# Patient Record
Sex: Male | Born: 1985
Health system: Southern US, Community
[De-identification: ages and names within clinical notes are randomized; demographics above are authoritative.]

## PROBLEM LIST (undated history)

## (undated) ENCOUNTER — Emergency Department: Admission: EM | Payer: MEDICAID | Source: Home / Self Care

## (undated) DIAGNOSIS — N179 Acute kidney failure, unspecified: Secondary | ICD-10-CM

## (undated) DIAGNOSIS — E1165 Type 2 diabetes mellitus with hyperglycemia: Secondary | ICD-10-CM

## (undated) HISTORY — PX: TONSILLECTOMY: SUR1361

## (undated) HISTORY — PX: TYMPANOSTOMY TUBE PLACEMENT: SHX32

## (undated) HISTORY — DX: Type 2 diabetes mellitus with hyperglycemia: E11.65

## (undated) HISTORY — DX: Acute kidney failure, unspecified: N17.9

---

## 2004-05-22 ENCOUNTER — Emergency Department: Payer: Self-pay | Admitting: Emergency Medicine

## 2005-07-11 ENCOUNTER — Emergency Department: Payer: Self-pay | Admitting: Emergency Medicine

## 2007-07-25 ENCOUNTER — Emergency Department: Payer: Self-pay | Admitting: Emergency Medicine

## 2012-10-04 ENCOUNTER — Emergency Department: Payer: Self-pay | Admitting: Internal Medicine

## 2015-03-27 ENCOUNTER — Inpatient Hospital Stay: Payer: Self-pay

## 2015-03-27 ENCOUNTER — Inpatient Hospital Stay
Admission: EM | Admit: 2015-03-27 | Discharge: 2015-03-27 | DRG: 683 | Discharge status: Against Medical Advice | Payer: Self-pay | Attending: Internal Medicine | Admitting: Internal Medicine

## 2015-03-27 ENCOUNTER — Encounter: Payer: Self-pay | Admitting: Internal Medicine

## 2015-03-27 DIAGNOSIS — Z716 Tobacco abuse counseling: Secondary | ICD-10-CM | POA: Diagnosis present

## 2015-03-27 DIAGNOSIS — Z8249 Family history of ischemic heart disease and other diseases of the circulatory system: Secondary | ICD-10-CM

## 2015-03-27 DIAGNOSIS — F17210 Nicotine dependence, cigarettes, uncomplicated: Secondary | ICD-10-CM | POA: Diagnosis present

## 2015-03-27 DIAGNOSIS — R112 Nausea with vomiting, unspecified: Secondary | ICD-10-CM

## 2015-03-27 DIAGNOSIS — N179 Acute kidney failure, unspecified: Principal | ICD-10-CM | POA: Diagnosis present

## 2015-03-27 DIAGNOSIS — E871 Hypo-osmolality and hyponatremia: Secondary | ICD-10-CM | POA: Diagnosis present

## 2015-03-27 LAB — COMPREHENSIVE METABOLIC PANEL
AST: 37 U/L (ref 15–41)
Albumin: 6.8 g/dL — ABNORMAL HIGH (ref 3.5–5.0)
Alkaline Phosphatase: 118 U/L (ref 38–126)
Anion gap: 18 — ABNORMAL HIGH (ref 5–15)
BUN: 29 mg/dL — AB (ref 6–20)
CALCIUM: 11.1 mg/dL — AB (ref 8.9–10.3)
CO2: 21 mmol/L — AB (ref 22–32)
CREATININE: 3.4 mg/dL — AB (ref 0.61–1.24)
Chloride: 94 mmol/L — ABNORMAL LOW (ref 101–111)
GFR calc non Af Amer: 23 mL/min — ABNORMAL LOW (ref >60)
GFR, EST AFRICAN AMERICAN: 26 mL/min — AB (ref >60)
Glucose, Bld: 192 mg/dL — ABNORMAL HIGH (ref 65–99)
POTASSIUM: 4 mmol/L (ref 3.5–5.1)
Sodium: 133 mmol/L — ABNORMAL LOW (ref 135–145)
TOTAL PROTEIN: 11 g/dL — AB (ref 6.5–8.1)
Total Bilirubin: 0.6 mg/dL (ref 0.3–1.2)

## 2015-03-27 LAB — LIPASE, BLOOD: LIPASE: 18 U/L — AB (ref 22–51)

## 2015-03-27 LAB — CBC WITH DIFFERENTIAL/PLATELET
BASOS PCT: 0 %
Basophils Absolute: 0.1 10*3/uL (ref 0–0.1)
EOS ABS: 0 10*3/uL (ref 0–0.7)
Eosinophils Relative: 0 %
HEMATOCRIT: 54.2 % — AB (ref 40.0–52.0)
Hemoglobin: 18.1 g/dL — ABNORMAL HIGH (ref 13.0–18.0)
LYMPHS PCT: 10 %
Lymphs Abs: 1.8 10*3/uL (ref 1.0–3.6)
MCH: 27.7 pg (ref 26.0–34.0)
MCHC: 33.4 g/dL (ref 32.0–36.0)
MCV: 83 fL (ref 80.0–100.0)
MONO ABS: 1 10*3/uL (ref 0.2–1.0)
MONOS PCT: 5 %
Neutro Abs: 15.1 10*3/uL — ABNORMAL HIGH (ref 1.4–6.5)
Neutrophils Relative %: 85 %
Platelets: 329 10*3/uL (ref 150–440)
RBC: 6.53 MIL/uL — AB (ref 4.40–5.90)
RDW: 14.6 % — ABNORMAL HIGH (ref 11.5–14.5)
WBC: 17.9 10*3/uL — ABNORMAL HIGH (ref 3.8–10.6)

## 2015-03-27 LAB — BASIC METABOLIC PANEL
ANION GAP: 11 (ref 5–15)
BUN: 28 mg/dL — AB (ref 6–20)
CALCIUM: 9.5 mg/dL (ref 8.9–10.3)
CO2: 24 mmol/L (ref 22–32)
Chloride: 100 mmol/L — ABNORMAL LOW (ref 101–111)
Creatinine, Ser: 2.6 mg/dL — ABNORMAL HIGH (ref 0.61–1.24)
GFR calc Af Amer: 36 mL/min — ABNORMAL LOW (ref >60)
GFR calc non Af Amer: 31 mL/min — ABNORMAL LOW (ref >60)
Glucose, Bld: 156 mg/dL — ABNORMAL HIGH (ref 65–99)
Potassium: 4.5 mmol/L (ref 3.5–5.1)
Sodium: 135 mmol/L (ref 135–145)

## 2015-03-27 LAB — URINALYSIS COMPLETE WITH MICROSCOPIC (ARMC ONLY)
BILIRUBIN URINE: NEGATIVE
Bacteria, UA: NONE SEEN
GLUCOSE, UA: NEGATIVE mg/dL
Ketones, ur: NEGATIVE mg/dL
LEUKOCYTES UA: NEGATIVE
Nitrite: NEGATIVE
PROTEIN: 30 mg/dL — AB
SPECIFIC GRAVITY, URINE: 1.021 (ref 1.005–1.030)
pH: 5 (ref 5.0–8.0)

## 2015-03-27 LAB — SODIUM, URINE, RANDOM: SODIUM UR: 20 mmol/L

## 2015-03-27 LAB — CK: Total CK: 348 U/L (ref 49–397)

## 2015-03-27 LAB — CREATININE, URINE, RANDOM: Creatinine, Urine: 330 mg/dL

## 2015-03-27 MED ORDER — SODIUM CHLORIDE 0.9 % IV BOLUS (SEPSIS)
1000.0000 mL | Freq: Once | INTRAVENOUS | Status: AC
  Administered 2015-03-27: 1000 mL via INTRAVENOUS

## 2015-03-27 MED ORDER — OXYCODONE HCL 5 MG PO TABS: 5.0000 mg | ORAL_TABLET | ORAL | Status: DC | PRN

## 2015-03-27 MED ORDER — ONDANSETRON HCL 4 MG PO TABS: 4.0000 mg | ORAL_TABLET | Freq: Four times a day (QID) | ORAL | Status: DC | PRN

## 2015-03-27 MED ORDER — NICOTINE 21 MG/24HR TD PT24
21.0000 mg | MEDICATED_PATCH | Freq: Every day | TRANSDERMAL | Status: DC
  Administered 2015-03-27: 21 mg via TRANSDERMAL
  Filled 2015-03-27: qty 1

## 2015-03-27 MED ORDER — ALPRAZOLAM 0.5 MG PO TABS
0.5000 mg | ORAL_TABLET | Freq: Two times a day (BID) | ORAL | Status: DC | PRN
  Administered 2015-03-27: 0.5 mg via ORAL
  Filled 2015-03-27: qty 1

## 2015-03-27 MED ORDER — ONDANSETRON HCL 4 MG/2ML IJ SOLN
INTRAMUSCULAR | Status: AC
  Administered 2015-03-27: 4 mg via INTRAVENOUS
  Filled 2015-03-27: qty 2

## 2015-03-27 MED ORDER — LORAZEPAM 2 MG/ML IJ SOLN
INTRAMUSCULAR | Status: AC
  Administered 2015-03-27: 1 mg via INTRAVENOUS
  Filled 2015-03-27: qty 1

## 2015-03-27 MED ORDER — ACETAMINOPHEN 650 MG RE SUPP: 650.0000 mg | Freq: Four times a day (QID) | RECTAL | Status: DC | PRN

## 2015-03-27 MED ORDER — HEPARIN SODIUM (PORCINE) 5000 UNIT/ML IJ SOLN
5000.0000 [IU] | Freq: Three times a day (TID) | INTRAMUSCULAR | Status: DC
  Administered 2015-03-27 (×2): 5000 [IU] via SUBCUTANEOUS
  Filled 2015-03-27 (×2): qty 1

## 2015-03-27 MED ORDER — LORAZEPAM 2 MG/ML IJ SOLN
1.0000 mg | Freq: Once | INTRAMUSCULAR | Status: AC
  Administered 2015-03-27: 1 mg via INTRAVENOUS

## 2015-03-27 MED ORDER — ACETAMINOPHEN 325 MG PO TABS
650.0000 mg | ORAL_TABLET | Freq: Four times a day (QID) | ORAL | Status: DC | PRN
  Administered 2015-03-27: 650 mg via ORAL
  Filled 2015-03-27: qty 2

## 2015-03-27 MED ORDER — ONDANSETRON HCL 4 MG/2ML IJ SOLN
4.0000 mg | Freq: Once | INTRAMUSCULAR | Status: AC
  Administered 2015-03-27: 4 mg via INTRAVENOUS

## 2015-03-27 MED ORDER — MORPHINE SULFATE 2 MG/ML IJ SOLN: 2.0000 mg | INTRAMUSCULAR | Status: DC | PRN

## 2015-03-27 MED ORDER — ONDANSETRON HCL 4 MG/2ML IJ SOLN: 4.0000 mg | Freq: Four times a day (QID) | INTRAMUSCULAR | Status: DC | PRN

## 2015-03-27 MED ORDER — SODIUM CHLORIDE 0.9 % IV SOLN
INTRAVENOUS | Status: DC
  Administered 2015-03-27 (×3): via INTRAVENOUS

## 2015-03-27 NOTE — H&P (Signed)
[facility] - [place] at [facility]   PATIENT NAME: Alexis [name]    MR#:  [id number]  DATE OF BIRTH:  12/17/1985   DATE OF ADMISSION:  03/27/2015  PRIMARY CARE PHYSICIAN: No primary care provider on file.   REQUESTING/REFERRING PHYSICIAN: Inocencio Homes  CHIEF COMPLAINT:   Chief Complaint  Patient presents with  . Dizziness  . Emesis  . Muscle Pain    HISTORY OF PRESENT ILLNESS:  Alexis [name]  is a 29 y.o. male without significant medical history presenting with dizziness and muscle cramping. 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 Complained of dizziness/lightheadedness nausea/vomiting of nonbloody/nonbilious emesis with diffuse muscle cramping. Given symptomatology percent to Hospital further workup and evaluation. Denies further symptoms at this time. Of note does take ibuprofen unable to quantify how much however states that it is frequent Emergency department course: Routine lab data reveals acute kidney injury IV fluids given  PAST MEDICAL HISTORY:  History reviewed. No pertinent past medical history.  PAST SURGICAL HISTORY:  History reviewed. No pertinent past surgical history.  SOCIAL HISTORY:   Social History  Substance Use Topics  . Smoking status: Current Every Day Smoker  . Smokeless tobacco: Not on file  . Alcohol Use: No    FAMILY HISTORY:   Family History  Problem Relation Age of Onset  . Hypertension Other     DRUG ALLERGIES:  No Known Allergies  REVIEW OF SYSTEMS:  REVIEW OF SYSTEMS:  CONSTITUTIONAL: Denies fevers, chills, fatigue,positive  weakness.  EYES: Denies blurred vision, double vision, or eye pain.  EARS, NOSE, THROAT: Denies tinnitus, ear pain, hearing loss.  RESPIRATORY: denies cough, shortness of breath, wheezing  CARDIOVASCULAR: Denies chest pain, palpitations, edema.   GASTROINTESTINAL: Positive nausea, vomiting,denies  diarrhea, abdominal pain.  GENITOURINARY: Denies dysuria, hematuria.  ENDOCRINE: Denies nocturia or thyroid problems. HEMATOLOGIC AND LYMPHATIC: Denies easy bruising or bleeding.  SKIN: Denies rash or lesions.  MUSCULOSKELETAL: Denies pain in neck, back, shoulder, knees, hips, or further arthritic symptoms.  NEUROLOGIC: Denies paralysis, paresthesias.  PSYCHIATRIC: Denies anxiety or depressive symptoms. Otherwise full review of systems performed by me is negative.   MEDICATIONS AT HOME:   Prior to Admission medications   Medication Sig Start Date End Date Taking? Authorizing Provider  ibuprofen (ADVIL,MOTRIN) 800 MG tablet Take 800 mg by mouth every 8 (eight) hours as needed for mild pain.   Yes Historical Provider, MD      VITAL SIGNS:  Blood pressure 124/89, pulse 84, temperature 97.6 F (36.4 C), temperature source Oral, resp. rate 17, height 5\' 7"  (1.702 m), weight 205 lb (92.987 kg), SpO2 98 %.  PHYSICAL EXAMINATION:  VITAL SIGNS: Filed Vitals:   03/27/15 0230  BP: 124/89  Pulse: 84  Temp:   Resp:    GENERAL:29 y.o.male currently in no acute distress.  HEAD: Normocephalic, atraumatic.  EYES: Pupils equal, round, reactive to light. Extraocular muscles intact. No scleral icterus.  MOUTH: Dry mucosal  membrane. Dentition intact. No abscess noted.  EAR, NOSE, THROAT: Clear without exudates. No external lesions.  NECK: Supple. No thyromegaly. No nodules. No JVD.  PULMONARY: Clear to ascultation, without wheeze rails or rhonci. No use of accessory muscles, Good respiratory effort. good air entry bilaterally CHEST: Nontender to palpation.  CARDIOVASCULAR: S1 and S2.Tachycardic. No murmurs, rubs, or gallops. No edema. Pedal pulses 2+ bilaterally.  GASTROINTESTINAL: Soft, nontender, nondistended. No  masses. Positive bowel sounds. No hepatosplenomegaly.  MUSCULOSKELETAL: No swelling, clubbing, or edema. Range of motion full in  all extremities.  NEUROLOGIC: Cranial nerves II through XII are intact. No gross focal neurological deficits. Sensation intact. Reflexes intact.  SKIN: No ulceration, lesions, rashes, or cyanosis. Skin warm and dry. Turgor intact.  PSYCHIATRIC: Mood, affect within normal limits. The patient is awake, alert and oriented x 3. Insight, judgment intact.    LABORATORY PANEL:   CBC  Recent Labs Lab 03/27/15 0216  WBC 17.9*  HGB 18.1*  HCT 54.2*  PLT 329   ------------------------------------------------------------------------------------------------------------------  Chemistries   Recent Labs Lab 03/27/15 0216  NA 133*  K 4.0  CL 94*  CO2 21*  GLUCOSE 192*  BUN 29*  CREATININE 3.40*  CALCIUM 11.1*  AST 37  ALT <5*  ALKPHOS 118  BILITOT 0.6   ------------------------------------------------------------------------------------------------------------------  Cardiac Enzymes No results for input(s): TROPONINI in the last 168 hours. ------------------------------------------------------------------------------------------------------------------  RADIOLOGY:  No results found.  EKG:  No orders found for this or any previous visit.  IMPRESSION AND PLAN:   28 year old Caucasian gentleman without significant medical history presenting with dizziness muscle cramping found be in acute kidney injury  1. Acute kidney injury: IV fluid challenge, hydrate with normal saline and follow urine output/renal function question prerenal versus ibuprofen usage, check renal ultrasound, urine sodium, urine creatinine . Avoid further nephrotoxic agents 2. Hyponatremia: Normal saline infusion, follow sodium 3. Venous thromboembolism prophylactic: Heparin subcutaneous   All the records are reviewed and case discussed with ED provider. Management plans discussed with the patient, family and they are in agreement.  CODE STATUS: Full  TOTAL TIME TAKING CARE OF THIS PATIENT: 35 minutes.     [name],  Mardi Mainland.D on 03/27/2015 at 3:40 AM  Between 7am to 6pm - Pager - [phone]  After 6pm: House Pager: - [phone]  [facility]  Office  [phone]  CC: Primary care physician; No primary care provider on file.

## 2015-03-27 NOTE — ED Provider Notes (Signed)
[facility] Emergency Department Provider Note  ____________________________________________  Time seen: Approximately 2:26 AM  I have reviewed the triage vital signs and the nursing notes.   HISTORY  Chief Complaint Dizziness; Emesis; and Muscle Pain    HPI Alexis [name] is a 29 y.o. male with no chronic medical problems presents for evaluation of diffuse muscle cramping, vomiting, lightheadedness. 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 Last night he began having cramping throughout his entire body, he has vomited several times, the episodes have been nonbloody nonbilious. No chest pain, no difficulty breathing. No recent illness including no cough, sneezing, runny nose, fevers or chills. Current severity of symptoms is moderate. No modifying factors.   History reviewed. No pertinent past medical history.  Patient Active Problem List   Diagnosis Date Noted  . Acute kidney injury 03/27/2015    History reviewed. No pertinent past surgical history.  No current outpatient prescriptions on file.  Allergies Review of patient's allergies indicates no known allergies.  Family History  Problem Relation Age of Onset  . Hypertension Other     Social History Social History  Substance Use Topics  . Smoking status: Current Every Day Smoker -- 0.50 packs/day    Types: Cigarettes  . Smokeless tobacco: None  . Alcohol Use: No    Review of Systems Constitutional: No fever/chills Eyes: No visual changes. ENT: No sore throat. Cardiovascular: Denies chest pain. Respiratory: Denies shortness of breath. Gastrointestinal: No abdominal pain.  No nausea, no vomiting.  No diarrhea.  No constipation. Genitourinary: Negative for dysuria. Musculoskeletal: Negative for back pain. Skin: Negative for rash. Neurological: Negative for headaches, focal weakness or numbness.  10-point ROS otherwise  negative.  ____________________________________________   PHYSICAL EXAM:  VITAL SIGNS: ED Triage Vitals  Enc Vitals Group     BP 03/27/15 0139 121/98 mmHg     Pulse Rate 03/27/15 0139 76     Resp 03/27/15 0139 20     Temp 03/27/15 0139 97.8 F (36.6 C)     Temp Source 03/27/15 0139 Oral     SpO2 03/27/15 0139 97 %     Weight 03/27/15 0139 205 lb (92.987 kg)     Height 03/27/15 0139 5\' 7"  (1.702 m)     Head Cir --      Peak Flow --      Pain Score --      Pain Loc --      Pain Edu? --      Excl. in GC? --     Constitutional: Alert and oriented. Nontoxic-appearing, in NAD Eyes: Conjunctivae are normal. PERRL. EOMI. Head: Atraumatic. Nose: No congestion/rhinnorhea. Mouth/Throat: Mucous membranes are dry.  Oropharynx non-erythematous. Neck: No stridor.  Cardiovascular: Normal rate, regular rhythm. Grossly normal heart sounds.  Good peripheral circulation. Respiratory: Normal respiratory effort.  No retractions. Lungs CTAB. Gastrointestinal: Soft and nontender. No distention. No abdominal bruits. No CVA tenderness. Genitourinary: deferred Musculoskeletal: No lower extremity tenderness nor edema.  No joint effusions. Neurologic:  Normal speech and language. No gross focal neurologic deficits are appreciated. No gait instability. Skin:  Skin is warm, dry and intact. No rash noted. Psychiatric: Mood and affect are normal. Speech and behavior are normal.  ____________________________________________   LABS (all labs ordered are listed, but only abnormal results are displayed)  Labs Reviewed  CBC WITH DIFFERENTIAL/PLATELET - Abnormal; Notable for the following:    WBC 17.9 (*)    RBC 6.53 (*)    Hemoglobin  18.1 (*)    HCT 54.2 (*)    RDW 14.6 (*)    Neutro Abs 15.1 (*)    All other components within normal limits  COMPREHENSIVE METABOLIC PANEL - Abnormal; Notable for the following:    Sodium 133 (*)    Chloride 94 (*)    CO2 21 (*)    Glucose, Bld 192 (*)    BUN 29  (*)    Creatinine, Ser 3.40 (*)    Calcium 11.1 (*)    Total Protein 11.0 (*)    Albumin 6.8 (*)    ALT <5 (*)    GFR calc non Af Amer 23 (*)    GFR calc Af Amer 26 (*)    Anion gap 18 (*)    All other components within normal limits  LIPASE, BLOOD - Abnormal; Notable for the following:    Lipase 18 (*)    All other components within normal limits  CK  BASIC METABOLIC PANEL  SODIUM, URINE, RANDOM  CREATININE, URINE, RANDOM  URINALYSIS COMPLETEWITH MICROSCOPIC ([facility] ONLY)   ____________________________________________  EKG  none ____________________________________________  RADIOLOGY  none ____________________________________________   PROCEDURES  Procedure(s) performed: None  Critical Care performed: No  ____________________________________________   INITIAL IMPRESSION / ASSESSMENT AND PLAN / ED COURSE  Pertinent labs & imaging results that were available during my care of the patient were reviewed by me and considered in my medical decision making (see chart for details).  Alexis [name] is a 29 y.o. male with no chronic medical problems presents for evaluation of diffuse muscle cramping, vomiting, lightheadedness. On arrival, he was in distress secondary to muscle cramps however they have improved significantly with fluids and Ativan. Vital signs stable, he is afebrile. He has a benign examination. Labs are notable for leukocytosis and elevations in hemoglobin which I suspect are secondary to hemoconcentration. He is mildly hyponatremic. He has acute renal failure with creatinine of 3.40. Will give liberal IV fluids. CK not elevated, not consistent with rhabdomyolysis. Discussed with hospitalist for admission. ____________________________________________   FINAL CLINICAL IMPRESSION(S) / ED DIAGNOSES  Final diagnoses:  Acute renal failure, unspecified acute renal failure type  Non-intractable vomiting with nausea, vomiting of unspecified type       Gayla Doss, MD 03/27/15 [phone]

## 2015-03-27 NOTE — ED Notes (Signed)
Pt reports nausea adn continued full body shaking. MD notified

## 2015-03-27 NOTE — Progress Notes (Signed)
Per dr Amado Coe on rounds if ultrasound is wnl , may d/c  Foley cath. Voiding qs. Foley order d/ced

## 2015-03-27 NOTE — Progress Notes (Signed)
[facility] - [place] at [facility]   PATIENT NAME: Alexis [name]    MR#:  [id number]  DATE OF BIRTH:  11/04/1985  SUBJECTIVE:  CHIEF COMPLAINT:  Patient is feeling okay , wants to smoke. Weakness is better  REVIEW OF SYSTEMS:  CONSTITUTIONAL: No fever, fatigue or weakness.  EYES: No blurred or double vision.  EARS, NOSE, AND THROAT: No tinnitus or ear pain.  RESPIRATORY: No cough, shortness of breath, wheezing or hemoptysis.  CARDIOVASCULAR: No chest pain, orthopnea, edema.  GASTROINTESTINAL: No nausea, vomiting, diarrhea or abdominal pain.  GENITOURINARY: No dysuria, hematuria.  ENDOCRINE: No polyuria, nocturia,  HEMATOLOGY: No anemia, easy bruising or bleeding SKIN: No rash or lesion. MUSCULOSKELETAL: No joint pain or arthritis.   NEUROLOGIC: No tingling, numbness, weakness.  PSYCHIATRY: No anxiety or depression.   DRUG ALLERGIES:  No Known Allergies  VITALS:  Blood pressure 128/64, pulse 84, temperature 97.8 F (36.6 C), temperature source Oral, resp. rate 16, height 5\' 7"  (1.702 m), weight 89.812 kg (198 lb), SpO2 98 %.  PHYSICAL EXAMINATION:  GENERAL:  29 y.o.-year-old patient lying in the bed with no acute distress.  EYES: Pupils equal, round, reactive to light and accommodation. No scleral icterus. Extraocular muscles intact.  HEENT: Head atraumatic, normocephalic. Oropharynx and nasopharynx clear.  NECK:  Supple, no jugular venous distention. No thyroid enlargement, no tenderness.  LUNGS: Normal breath sounds bilaterally, no wheezing, rales,rhonchi or crepitation. No use of accessory muscles of respiration.  CARDIOVASCULAR: S1, S2 normal. No murmurs, rubs, or gallops.  ABDOMEN: Soft, nontender, nondistended. Bowel sounds present. No organomegaly or mass.  EXTREMITIES: No pedal edema, cyanosis, or clubbing.  NEUROLOGIC: Cranial nerves II through XII are intact. Muscle strength 5/5 in all extremities. Sensation intact. Gait not checked.   PSYCHIATRIC: The patient is alert and oriented x 3.  SKIN: No obvious rash, lesion, or ulcer.    LABORATORY PANEL:   CBC  Recent Labs Lab 03/27/15 0216  WBC 17.9*  HGB 18.1*  HCT 54.2*  PLT 329   ------------------------------------------------------------------------------------------------------------------  Chemistries   Recent Labs Lab 03/27/15 0216 03/27/15 0440  NA 133* 135  K 4.0 4.5  CL 94* 100*  CO2 21* 24  GLUCOSE 192* 156*  BUN 29* 28*  CREATININE 3.40* 2.60*  CALCIUM 11.1* 9.5  AST 37  --   ALT <5*  --   ALKPHOS 118  --   BILITOT 0.6  --    ------------------------------------------------------------------------------------------------------------------  Cardiac Enzymes No results for input(s): TROPONINI in the last 168 hours. ------------------------------------------------------------------------------------------------------------------  RADIOLOGY:  Renal ultrasound is pending  EKG:  No orders found for this or any previous visit.  ASSESSMENT AND PLAN:   29 year old Caucasian gentleman without significant medical history presenting with dizziness muscle cramping found be in acute kidney injury  1. Acute kidney injury:Probably prerenal versus renal from ibuprofen use Creatinine is likely better with  IV fluid challenge,  Continue to hydrate with normal saline and follow urine output  check renal ultrasound, urine sodium, urine creatinine .  Avoid further nephrotoxic agents  2. Hyponatremia:  sodium is better with Normal saline, check BMP in a.m.   3.Tobacco abuse disorder Counseled patient to quit smoking for 3-5 minutes. Patient is agreeable for nicotine patch  3. Venous thromboembolism prophylactic: Heparin subcutaneous    All the records are reviewed and case discussed with Care Management/Social Workerr. Management plans discussed with the patient, family and they are in agreement.  CODE STATUS: Full code  TOTAL TIME TAKING  CARE OF THIS PATIENT: 35  minutes.   POSSIBLE D/C IN  1-2 DAYS, DEPENDING ON CLINICAL CONDITION.   Ramonita Lab M.D on 03/27/2015 at 12:22 PM  Between 7am to 6pm - Pager - [phone] After 6pm go to [email or web address] - password [email or web address] [facility]  [place] [facility]  Office  [phone]  CC: Primary care physician; No primary care provider on file.

## 2015-03-27 NOTE — H&P (Signed)
Pt left hospital AMA.  Nurse explained to pt leaving could result in more kidney injury. Dr Marcelino Duster notified of pt's decision to leave

## 2015-03-27 NOTE — ED Notes (Signed)
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  Reports dizziness, vomiting and muscle cramping.

## 2015-03-27 NOTE — ED Notes (Signed)
Pt had episode of right sided calf cramping. Calf completely cramped and foot flexed upon assessment. Pts entire body began shaking after cramping stopped. Pt reported feeling extremely cold from NS infusion. Pt provided with blanket.

## 2015-03-27 NOTE — Progress Notes (Signed)
Patient admitted from ED. Medicated with Tylenol for headache without improvement. Tolerating fluids well. Urine labs still pending since patient has not voided yet. Significant other at the bedside.

## 2015-04-12 NOTE — Discharge Summary (Signed)
[facility] - [place] at [facility]   PATIENT NAME: Alexis [name]    MR#:  [id number]  DATE OF BIRTH:  14-Jul-1986  DATE OF ADMISSION:  03/27/2015 ADMITTING PHYSICIAN: Wyatt Haste, MD  DATE OF DISCHARGE: 03/27/2015  8:36 PM  PRIMARY CARE PHYSICIAN: No primary care provider on file.    ADMISSION DIAGNOSIS:  Acute kidney injury [N17.9]  DISCHARGE DIAGNOSIS:  Principal Problem:   Acute kidney injury   SECONDARY DIAGNOSIS:  History reviewed. No pertinent past medical history.  HOSPITAL COURSE:   29 year-old Caucasian gentleman without significant medical history presented with dizziness muscle cramping found be in acute kidney injury  1. Acute kidney injury:Probably prerenal versus renal from ibuprofen use Creatinine is  Anticipated to get  better with IV fluids Plan was to Continue to hydrate with normal saline and follow urine output Nml renal  ultrasound To avoid further nephrotoxic agents But pt left hospital AMA on 8/14 evening  2. Hyponatremia: sodium is better with Normal saline, plan is to check BMP in a.m.   3.Tobacco abuse disorder Counseled patient to quit smoking for 3-5 minutes. Patient is agreeable for nicotine patch  pt left hospital AMA on 8/14 evening    DISCHARGE CONDITIONS:  pt left hospital AMA on 8/14 evening    CONSULTS OBTAINED:    none  PROCEDURES none  DRUG ALLERGIES:  No Known Allergies  DISCHARGE MEDICATIONS:   Discharge Medication List as of 03/27/2015  8:43 PM    CONTINUE these medications which have NOT CHANGED   Details  ibuprofen (ADVIL,MOTRIN) 800 MG tablet Take 800 mg by mouth every 8 (eight) hours as needed for mild pain., Until Discontinued, Historical Med         DISCHARGE INSTRUCTIONS:   pt left hospital AMA on 8/14 evening    DIET:  pt left hospital AMA on 8/14 evening    DISCHARGE CONDITION:  pt left hospital AMA on 8/14 evening    ACTIVITY:  pt left hospital AMA on  8/14 evening    OXYGEN:  Home Oxygen: no   Oxygen Delivery: RA  DISCHARGE LOCATION:  pt left hospital AMA on 8/14 evening    If you experience worsening of your admission symptoms, develop shortness of breath, life threatening emergency, suicidal or homicidal thoughts you must seek medical attention immediately by calling 911 or calling your MD immediately  if symptoms less severe.  You Must read complete instructions/literature along with all the possible adverse reactions/side effects for all the Medicines you take and that have been prescribed to you. Take any new Medicines after you have completely understood and accpet all the possible adverse reactions/side effects.   Please note  You were cared for by a hospitalist during your hospital stay. If you have any questions about your discharge medications or the care you received while you were in the hospital after you are discharged, you can call the unit and asked to speak with the hospitalist on call if the hospitalist that took care of you is not available. Once you are discharged, your primary care physician will handle any further medical issues. Please note that NO REFILLS for any discharge medications will be authorized once you are discharged, as it is imperative that you return to your primary care physician (or establish a relationship with a primary care physician if you do not have one) for your aftercare needs so that they can reassess your need for medications and monitor your lab values.  Today  Chief Complaint  Patient presents with  . Dizziness  . Emesis  . Muscle Pain   Pt was felling okay during my examination in am, wants to go home but understood the importance of getting IVF for his AKI  ROS: NONE CONSTITUTIONAL: Denies fevers, chills. Denies any fatigue, weakness.  EYES: Denies blurry vision, double vision, eye pain. EARS, NOSE, THROAT: Denies tinnitus, ear pain, hearing loss. RESPIRATORY: Denies  cough, wheeze, shortness of breath.  CARDIOVASCULAR: Denies chest pain, palpitations, edema.  GASTROINTESTINAL: Denies nausea, vomiting, diarrhea, abdominal pain. Denies bright red blood per rectum. GENITOURINARY: Denies dysuria, hematuria. ENDOCRINE: Denies nocturia or thyroid problems. HEMATOLOGIC AND LYMPHATIC: Denies easy bruising or bleeding. SKIN: Denies rash or lesion. MUSCULOSKELETAL: Denies pain in neck, back, shoulder, knees, hips or arthritic symptoms.  NEUROLOGIC: Denies paralysis, paresthesias.  PSYCHIATRIC: Denies anxiety or depressive symptoms.   VITAL SIGNS:  Blood pressure 125/72, pulse 64, temperature 98 F (36.7 C), temperature source Oral, resp. rate 18, height  (1.702 m), weight 89.812 kg (198 lb), SpO2 100 %.  I/O:  No intake or output data in the 24 hours ending 04/12/15 1519  PHYSICAL EXAMINATION:  GENERAL:  29 y.o.-year-old patient lying in the bed with no acute distress.  EYES: Pupils equal, round, reactive to light and accommodation. No scleral icterus. Extraocular muscles intact.  HEENT: Head atraumatic, normocephalic. Oropharynx and nasopharynx clear.  NECK:  Supple, no jugular venous distention. No thyroid enlargement, no tenderness.  LUNGS: Normal breath sounds bilaterally, no wheezing, rales,rhonchi or crepitation. No use of accessory muscles of respiration.  CARDIOVASCULAR: S1, S2 normal. No murmurs, rubs, or gallops.  ABDOMEN: Soft, non-tender, non-distended. Bowel sounds present. No organomegaly or mass.  EXTREMITIES: No pedal edema, cyanosis, or clubbing.  NEUROLOGIC: Cranial nerves II through XII are intact. Muscle strength 5/5 in all extremities. Sensation intact. Gait not checked.  PSYCHIATRIC: The patient is alert and oriented x 3.  SKIN: No obvious rash, lesion, or ulcer.   DATA REVIEW:   CBC No results for input(s): WBC, HGB, HCT, PLT in the last 168 hours.  Chemistries  No results for input(s): NA, K, CL, CO2, GLUCOSE, BUN,  CREATININE, CALCIUM, MG, AST, ALT, ALKPHOS, BILITOT in the last 168 hours.  Invalid input(s): GFRCGP  Cardiac Enzymes No results for input(s): TROPONINI in the last 168 hours.  Microbiology Results  No results found for this or any previous visit.  RADIOLOGY:  No results found.  EKG:  No orders found for this or any previous visit.    Management plans discussed with the patient, family DURING MY ROUNDING in am and they are in agreement   CODE STATUS:full code   TOTAL TIME TAKING CARE OF THIS PATIENT: 35 min  pt left hospital AMA on 8/14 evening     @  on 04/12/2015 at 3:19 PM  Between 7am to 6pm - Pager - [phone]  After 6pm go to [email or web address] - password [email or web address] [facility]  [place] [facility]  Office  [phone]  CC: Primary care physician; No primary care provider on file.

## 2016-01-20 ENCOUNTER — Ambulatory Visit (INDEPENDENT_AMBULATORY_CARE_PROVIDER_SITE_OTHER): Payer: Self-pay | Admitting: Family Medicine

## 2016-01-20 ENCOUNTER — Other Ambulatory Visit: Payer: Self-pay | Admitting: Family Medicine

## 2016-01-20 ENCOUNTER — Encounter: Payer: Self-pay | Admitting: Family Medicine

## 2016-01-20 VITALS — BP 126/80 | HR 108 | Temp 99.1°F | Ht 66.2 in | Wt 198.0 lb

## 2016-01-20 DIAGNOSIS — E663 Overweight: Secondary | ICD-10-CM

## 2016-01-20 DIAGNOSIS — E1165 Type 2 diabetes mellitus with hyperglycemia: Secondary | ICD-10-CM

## 2016-01-20 DIAGNOSIS — R739 Hyperglycemia, unspecified: Secondary | ICD-10-CM

## 2016-01-20 DIAGNOSIS — Z13220 Encounter for screening for lipoid disorders: Secondary | ICD-10-CM

## 2016-01-20 DIAGNOSIS — E119 Type 2 diabetes mellitus without complications: Secondary | ICD-10-CM

## 2016-01-20 DIAGNOSIS — E1065 Type 1 diabetes mellitus with hyperglycemia: Secondary | ICD-10-CM | POA: Insufficient documentation

## 2016-01-20 DIAGNOSIS — IMO0002 Reserved for concepts with insufficient information to code with codable children: Secondary | ICD-10-CM | POA: Insufficient documentation

## 2016-01-20 HISTORY — DX: Type 2 diabetes mellitus with hyperglycemia: E11.65

## 2016-01-20 HISTORY — DX: Reserved for concepts with insufficient information to code with codable children: IMO0002

## 2016-01-20 LAB — UA/M W/RFLX CULTURE, ROUTINE
BILIRUBIN UA: NEGATIVE
Ketones, UA: NEGATIVE
Leukocytes, UA: NEGATIVE
NITRITE UA: NEGATIVE
PH UA: 6 (ref 5.0–7.5)
PROTEIN UA: NEGATIVE
RBC UA: NEGATIVE
Specific Gravity, UA: 1.01 (ref 1.005–1.030)
UUROB: 0.2 mg/dL (ref 0.2–1.0)

## 2016-01-20 LAB — MICROALBUMIN, URINE WAIVED
Creatinine, Urine Waived: 100 mg/dL (ref 10–300)
MICROALB, UR WAIVED: 10 mg/L (ref 0–19)
Microalb/Creat Ratio: <30 mg/g (ref <30)

## 2016-01-20 LAB — BAYER DCA HB A1C WAIVED: HB A1C (BAYER DCA - WAIVED): >14 % — ABNORMAL HIGH (ref <7.0)

## 2016-01-20 MED ORDER — METFORMIN HCL 1000 MG PO TABS: ORAL_TABLET | ORAL | Status: DC

## 2016-01-20 NOTE — Patient Instructions (Addendum)
Basic Carbohydrate Counting for Diabetes Mellitus Carbohydrate counting is a method for keeping track of the amount of carbohydrates you eat. Eating carbohydrates naturally increases the level of sugar (glucose) in your blood, so it is important for you to know the amount that is okay for you to have in every meal. Carbohydrate counting helps keep the level of glucose in your blood within normal limits. The amount of carbohydrates allowed is different for every person. A dietitian can help you calculate the amount that is right for you. Once you know the amount of carbohydrates you can have, you can count the carbohydrates in the foods you want to eat. Carbohydrates are found in the following foods:  Grains, such as breads and cereals.  Dried beans and soy products.  Starchy vegetables, such as potatoes, peas, and corn.  Fruit and fruit juices.  Milk and yogurt.  Sweets and snack foods, such as cake, cookies, candy, chips, soft drinks, and fruit drinks. CARBOHYDRATE COUNTING There are two ways to count the carbohydrates in your food. You can use either of the methods or a combination of both. Reading the "Nutrition Facts" on Packaged Food The "Nutrition Facts" is an area that is included on the labels of almost all packaged food and beverages in the [place]. It includes the serving size of that food or beverage and information about the nutrients in each serving of the food, including the grams (g) of carbohydrate per serving.  Decide the number of servings of this food or beverage that you will be able to eat or drink. Multiply that number of servings by the number of grams of carbohydrate that is listed on the label for that serving. The total will be the amount of carbohydrates you will be having when you eat or drink this food or beverage. Learning Standard Serving Sizes of Food When you eat food that is not packaged or does not include "Nutrition Facts" on the label, you need to  measure the servings in order to count the amount of carbohydrates.A serving of most carbohydrate-rich foods contains about 15 g of carbohydrates. The following list includes serving sizes of carbohydrate-rich foods that provide 15 g ofcarbohydrate per serving:   1 slice of bread (1 oz) or 1 six-inch tortilla.    of a hamburger bun or English muffin.  4-6 crackers.   cup unsweetened dry cereal.    cup hot cereal.   cup rice or pasta.    cup mashed potatoes or  of a large baked potato.  1 cup fresh fruit or one small piece of fruit.    cup canned or frozen fruit or fruit juice.  1 cup milk.   cup plain fat-free yogurt or yogurt sweetened with artificial sweeteners.   cup cooked dried beans or starchy vegetable, such as peas, corn, or potatoes.  Decide the number of standard-size servings that you will eat. Multiply that number of servings by 15 (the grams of carbohydrates in that serving). For example, if you eat 2 cups of strawberries, you will have eaten 2 servings and 30 g of carbohydrates (2 servings x 15 g = 30 g). For foods such as soups and casseroles, in which more than one food is mixed in, you will need to count the carbohydrates in each food that is included. EXAMPLE OF CARBOHYDRATE COUNTING Sample Dinner  3 oz chicken breast.   cup of brown rice.   cup of corn.  1 cup milk.   1 cup strawberries with   sugar-free whipped topping.  Carbohydrate Calculation Step 1: Identify the foods that contain carbohydrates:   Rice.   Corn.   Milk.   Strawberries. Step 2:Calculate the number of servings eaten of each:   2 servings of rice.   1 serving of corn.   1 serving of milk.   1 serving of strawberries. Step 3: Multiply each of those number of servings by 15 g:   2 servings of rice x 15 g = 30 g.   1 serving of corn x 15 g = 15 g.   1 serving of milk x 15 g = 15 g.   1 serving of strawberries x 15 g = 15 g. Step 4: Add  together all of the amounts to find the total grams of carbohydrates eaten: 30 g + 15 g + 15 g + 15 g = 75 g.   This information is not intended to replace advice given to you by your health care provider. Make sure you discuss any questions you have with your health care provider.   Document Released: 07/30/2005 Document Revised: 08/20/2014 Document Reviewed: 06/26/2013 Elsevier Interactive Patient Education 2016 [employer] Diabetes and Sick Day Management Blood sugar (glucose) can be more difficult to control when you are sick. Colds, fever, flu, nausea, vomiting, and diarrhea are all examples of common illnesses that can cause problems for people with diabetes. Loss of body fluids (dehydration) from fever, vomiting, diarrhea, infection, and the stress of a sickness can all cause blood glucose levels to increase. Because of this, it is very important to take your diabetes medicines and to eat some form of carbohydrate food when you are sick. Liquid or soft foods are often tolerated, and they help to replace fluids. HOME CARE INSTRUCTIONS These main guidelines are intended for managing a short-term (24 hours or less) sickness:  Take your usual dose of insulin or oral diabetes medicine. An exception would be if you take any form of metformin. If you cannot eat or drink, you can become dehydrated and should not take this medicine.  Continue to take your insulin even if you are unable to eat solid foods or are vomiting. Your insulin dose may stay the same, or it may need to be increased when you are sick.  You will need to test your blood glucose more often, generally every 2-4 hours. If you have type 1 diabetes, test your urine for ketones every 4 hours. If you have type 2 diabetes, test your urine for ketones as directed by your health care provider.  Eat some form of food that contains carbohydrates. The carbohydrates can be in solid or liquid form. You should eat 45-50 g of carbohydrates every  3-4 hours.  Replace fluids if you have a fever, vomit, or have diarrhea. Ask your health care provider for specific rehydration instructions.  Watch carefully for the signs of ketoacidosis if you have type 1 diabetes. Call your health care provider if any of the following symptoms are present, especially in children:  Moderate to large ketones in the urine along with a high blood glucose level.  Severe nausea.  Vomiting.  Diarrhea.  Abdominal pain.  Rapid breathing.  Drink extra liquids that do not contain sugar such as water.  Be careful with over-the-counter medicines. Read the labels. They may contain sugar or types of sugars that can increase your blood glucose level. Food Choices for Illness All of the food choices below contain about 15 g of carbohydrates. Plan ahead and  keep some of these foods around.    to  cup carbonated beverage containing sugar. Carbonated beverages will usually be better tolerated if they are opened and left at room temperature for a few minutes.   of a twin frozen ice pop.   cup regular gelatin.   cup juice.   cup ice cream or frozen yogurt.   cup cooked cereal.   cup sherbet.  1 cup clear broth or soup.  1 cup cream soup.   cup regular custard.   cup regular pudding.  1 cup sports drink.  1 cup plain yogurt.  1 slice toast.  6 squares saltine crackers.  5 vanilla wafers. SEEK MEDICAL CARE IF:   You are unable to drink fluids, even small amounts.  You have nausea and vomiting for more than 6 hours.  You have diarrhea for more than 6 hours.  Your blood glucose level is more than 240 mg/dL, even with additional insulin.  There is a change in mental status.  You develop an additional serious sickness.  You have been sick for 2 days and are not getting better.  You have a fever. SEEK IMMEDIATE MEDICAL CARE IF:  You have difficulty breathing.  You have moderate to large ketone levels. MAKE SURE  YOU:  Understand these instructions.  Will watch your condition.  Will get help right away if you are not doing well or get worse.   This information is not intended to replace advice given to you by your health care provider. Make sure you discuss any questions you have with your health care provider.   Document Released: 08/02/2003 Document Revised: 08/20/2014 Document Reviewed: 01/06/2013 Elsevier Interactive Patient Education 2016 [employer] Diabetes and Exercise Exercising regularly is important. It is not just about losing weight. It has many health benefits, such as:  Improving your overall fitness, flexibility, and endurance.  Increasing your bone density.  Helping with weight control.  Decreasing your body fat.  Increasing your muscle strength.  Reducing stress and tension.  Improving your overall health. People with diabetes who exercise gain additional benefits because exercise:  Reduces appetite.  Improves the body's use of blood sugar (glucose).  Helps lower or control blood glucose.  Decreases blood pressure.  Helps control blood lipids (such as cholesterol and triglycerides).  Improves the body's use of the hormone insulin by:  Increasing the body's insulin sensitivity.  Reducing the body's insulin needs.  Decreases the risk for heart disease because exercising:  Lowers cholesterol and triglycerides levels.  Increases the levels of good cholesterol (such as high-density lipoproteins [HDL]) in the body.  Lowers blood glucose levels. YOUR ACTIVITY PLAN  Choose an activity that you enjoy, and set realistic goals. To exercise safely, you should begin practicing any new physical activity slowly, and gradually increase the intensity of the exercise over time. Your health care provider or diabetes educator can help create an activity plan that works for you. General recommendations include:  Encouraging children to engage in at least 60 minutes of  physical activity each day.  Stretching and performing strength training exercises, such as yoga or weight lifting, at least 2 times per week.  Performing a total of at least 150 minutes of moderate-intensity exercise each week, such as brisk walking or water aerobics.  Exercising at least 3 days per week, making sure you allow no more than 2 consecutive days to pass without exercising.  Avoiding long periods of inactivity (90 minutes or more). When you have to spend  an extended period of time sitting down, take frequent breaks to walk or stretch. RECOMMENDATIONS FOR EXERCISING WITH TYPE 1 OR TYPE 2 DIABETES   Check your blood glucose before exercising. If blood glucose levels are greater than 240 mg/dL, check for urine ketones. Do not exercise if ketones are present.  Avoid injecting insulin into areas of the body that are going to be exercised. For example, avoid injecting insulin into:  The arms when playing tennis.  The legs when jogging.  Keep a record of:  Food intake before and after you exercise.  Expected peak times of insulin action.  Blood glucose levels before and after you exercise.  The type and amount of exercise you have done.  Review your records with your health care provider. Your health care provider will help you to develop guidelines for adjusting food intake and insulin amounts before and after exercising.  If you take insulin or oral hypoglycemic agents, watch for signs and symptoms of hypoglycemia. They include:  Dizziness.  Shaking.  Sweating.  Chills.  Confusion.  Drink plenty of water while you exercise to prevent dehydration or heat stroke. Body water is lost during exercise and must be replaced.  Talk to your health care provider before starting an exercise program to make sure it is safe for you. Remember, almost any type of activity is better than none.   This information is not intended to replace advice given to you by your health care  provider. Make sure you discuss any questions you have with your health care provider.   Document Released: 10/20/2003 Document Revised: 12/14/2014 Document Reviewed: 01/06/2013 Elsevier Interactive Patient Education [employer]

## 2016-01-20 NOTE — Progress Notes (Signed)
BP 126/80 mmHg  Pulse 108  Temp(Src) 99.1 F (37.3 C)  Ht 5' 6.2" (1.681 m)  Wt 198 lb (89.812 kg)  BMI 31.78 kg/m2  SpO2 100%   Subjective:    Patient ID: Alexis [name], male    DOB: 1985-09-13, 30 y.o.   MRN: [id number]  HPI: KYSER WANDEL [name] is a 30 y.o. male  Chief Complaint  Patient presents with  . Diabetes   DIABETES Hypoglycemic episodes:no Polydipsia/polyuria: yes Visual disturbance: yes Chest pain: yes Paresthesias: no Glucose Monitoring: yes- past couple of days  Accucheck frequency: BID  Fasting glucose: 240s-270s Taking Insulin?: no Blood Pressure Monitoring: not checking Retinal Examination: Not up to Date Foot Exam: Up to Date Diabetic Education: Not Completed Pneumovax: unknown Influenza: Not up to Date Aspirin: no  Active Ambulatory Problems    Diagnosis Date Noted  . Diabetes type 2, uncontrolled (HCC) 01/20/2016   Resolved Ambulatory Problems    Diagnosis Date Noted  . Acute kidney injury (HCC) 03/27/2015   Past Medical History  Diagnosis Date  . Acute renal failure (HCC)    No Known Allergies  Past Surgical History  Procedure Laterality Date  . Tonsillectomy    . Tympanostomy tube placement     Social History   Social History  . Marital Status: Single    Spouse Name: N/A  . Number of Children: N/A  . Years of Education: N/A   Social History Main Topics  . Smoking status: Current Every Day Smoker -- 0.50 packs/day    Types: Cigarettes  . Smokeless tobacco: Never Used  . Alcohol Use: No  . Drug Use: Yes    Special: Marijuana  . Sexual Activity: Yes    Birth Control/ Protection: None   Other Topics Concern  . None   Social History Narrative   Family History  Problem Relation Age of Onset  . Hyperlipidemia Mother   . Hypertension Mother   . Diabetes Father   . Diabetes Maternal Grandmother     Lung   Review of Systems  Constitutional: Negative.   Respiratory: Negative.   Cardiovascular: Negative.    Psychiatric/Behavioral: Negative.    Per HPI unless specifically indicated above     Objective:    BP 126/80 mmHg  Pulse 108  Temp(Src) 99.1 F (37.3 C)  Ht 5' 6.2" (1.681 m)  Wt 198 lb (89.812 kg)  BMI 31.78 kg/m2  SpO2 100%  Wt Readings from Last 3 Encounters:  01/20/16 198 lb (89.812 kg)  03/27/15 198 lb (89.812 kg)    Physical Exam  Constitutional: He is oriented to person, place, and time. He appears well-developed and well-nourished. No distress.  HENT:  Head: Normocephalic and atraumatic.  Right Ear: Hearing normal.  Left Ear: Hearing normal.  Nose: Nose normal.  Eyes: Conjunctivae and lids are normal. Right eye exhibits no discharge. Left eye exhibits no discharge. No scleral icterus.  Cardiovascular: Normal rate, regular rhythm, normal heart sounds and intact distal pulses.  Exam reveals no gallop and no friction rub.   No murmur heard. Pulmonary/Chest: Effort normal and breath sounds normal. No respiratory distress. He has no wheezes. He has no rales. He exhibits no tenderness.  Musculoskeletal: Normal range of motion.  Neurological: He is alert and oriented to person, place, and time.  Skin: Skin is warm, dry and intact. No rash noted. No erythema. No pallor.  Psychiatric: He has a normal mood and affect. His speech is normal and behavior is normal. Judgment and  thought content normal. Cognition and memory are normal.  Nursing note and vitals reviewed.   Results for orders placed or performed in visit on 01/20/16  Bayer DCA Hb A1c Waived  Result Value Ref Range   Bayer DCA Hb A1c Waived >14.0 (H) <7.0 %  Microalbumin, Urine Waived  Result Value Ref Range   Microalb, Ur Waived 10 0 - 19 mg/L   Creatinine, Urine Waived 100 10 - 300 mg/dL   Microalb/Creat Ratio <30 <30 mg/g  UA/M w/rflx Culture, Routine  Result Value Ref Range   Specific Gravity, UA 1.010 1.005 - 1.030   pH, UA 6.0 5.0 - 7.5   Color, UA Yellow Yellow   Appearance Ur Clear Clear    Leukocytes, UA Negative Negative   Protein, UA Negative Negative/Trace   Glucose, UA 3+ (A) Negative   Ketones, UA Negative Negative   RBC, UA Negative Negative   Bilirubin, UA Negative Negative   Urobilinogen, Ur 0.2 0.2 - 1.0 mg/dL   Nitrite, UA Negative Negative      Assessment & Plan:   Problem List Items Addressed This Visit      Endocrine   Diabetes type 2, uncontrolled (HCC) - Primary    Newly diagnosed today. A1c >14. Will start him on metformin and increase him slowly. Will get pneumovax and diabetic education when insurance comes through. Test TID at this time to see what he can and cannot eat. Microalbumin negative. Hold on ACE at this time. Checking cholesterol. Work on diet and exercise. Continue to monitor. Recheck 1 month.       Relevant Medications   metFORMIN (GLUCOPHAGE) 1000 MG tablet    Other Visit Diagnoses    Hyperglycemia        A1c came back at 14. Diagnosed with DM today.    Screening for cholesterol level        Checking labs today. Await results. Treat as needed.     Overweight        Checking labs today. Await results.         Follow up plan: Return in about 4 weeks (around 02/17/2016) for DM visit.

## 2016-01-20 NOTE — Assessment & Plan Note (Addendum)
Newly diagnosed today. A1c >14. Will start him on metformin and increase him slowly. Will get pneumovax and diabetic education when insurance comes through. Test TID at this time to see what he can and cannot eat. Microalbumin negative. Hold on ACE at this time. Checking cholesterol. Work on diet and exercise. Continue to monitor. Recheck 1 month.

## 2016-01-21 LAB — CBC WITH DIFFERENTIAL/PLATELET
BASOS: 0 %
Basophils Absolute: 0 10*3/uL (ref 0.0–0.2)
EOS (ABSOLUTE): 0 10*3/uL (ref 0.0–0.4)
EOS: 1 %
HEMATOCRIT: 43.7 % (ref 37.5–51.0)
Hemoglobin: 14.7 g/dL (ref 12.6–17.7)
IMMATURE GRANS (ABS): 0 10*3/uL (ref 0.0–0.1)
IMMATURE GRANULOCYTES: 0 %
LYMPHS: 37 %
Lymphocytes Absolute: 2.8 10*3/uL (ref 0.7–3.1)
MCH: 28.3 pg (ref 26.6–33.0)
MCHC: 33.6 g/dL (ref 31.5–35.7)
MCV: 84 fL (ref 79–97)
Monocytes Absolute: 0.5 10*3/uL (ref 0.1–0.9)
Monocytes: 6 %
NEUTROS PCT: 56 %
Neutrophils Absolute: 4.4 10*3/uL (ref 1.4–7.0)
Platelets: 254 10*3/uL (ref 150–379)
RBC: 5.2 x10E6/uL (ref 4.14–5.80)
RDW: 14 % (ref 12.3–15.4)
WBC: 7.7 10*3/uL (ref 3.4–10.8)

## 2016-01-21 LAB — LIPID PANEL W/O CHOL/HDL RATIO
CHOLESTEROL TOTAL: 177 mg/dL (ref 100–199)
HDL: 41 mg/dL (ref >39)
LDL Calculated: 94 mg/dL (ref 0–99)
TRIGLYCERIDES: 209 mg/dL — AB (ref 0–149)
VLDL Cholesterol Cal: 42 mg/dL — ABNORMAL HIGH (ref 5–40)

## 2016-01-21 LAB — COMPREHENSIVE METABOLIC PANEL
A/G RATIO: 1.7 (ref 1.2–2.2)
AST: 19 IU/L (ref 0–40)
Albumin: 4.3 g/dL (ref 3.5–5.5)
Alkaline Phosphatase: 142 IU/L — ABNORMAL HIGH (ref 39–117)
BUN / CREAT RATIO: 14 (ref 9–20)
BUN: 15 mg/dL (ref 6–20)
Bilirubin Total: 0.2 mg/dL (ref 0.0–1.2)
CALCIUM: 9.8 mg/dL (ref 8.7–10.2)
CO2: 22 mmol/L (ref 18–29)
CREATININE: 1.07 mg/dL (ref 0.76–1.27)
Chloride: 96 mmol/L (ref 96–106)
GFR calc non Af Amer: 91 mL/min/{1.73_m2} (ref >59)
GFR, EST AFRICAN AMERICAN: 106 mL/min/{1.73_m2} (ref >59)
GLOBULIN, TOTAL: 2.5 g/dL (ref 1.5–4.5)
Glucose: 364 mg/dL — ABNORMAL HIGH (ref 65–99)
Potassium: 4.6 mmol/L (ref 3.5–5.2)
SODIUM: 135 mmol/L (ref 134–144)
TOTAL PROTEIN: 6.8 g/dL (ref 6.0–8.5)

## 2016-01-21 LAB — TSH: TSH: 0.659 u[IU]/mL (ref 0.450–4.500)

## 2016-01-23 ENCOUNTER — Encounter: Payer: Self-pay | Admitting: Family Medicine

## 2016-02-24 ENCOUNTER — Ambulatory Visit: Payer: Self-pay | Admitting: Family Medicine

## 2016-07-27 ENCOUNTER — Ambulatory Visit (INDEPENDENT_AMBULATORY_CARE_PROVIDER_SITE_OTHER): Payer: Self-pay | Admitting: Family Medicine

## 2016-07-27 ENCOUNTER — Encounter: Payer: Self-pay | Admitting: Family Medicine

## 2016-07-27 VITALS — BP 133/85 | HR 92 | Temp 97.6°F | Wt 191.0 lb

## 2016-07-27 DIAGNOSIS — E1165 Type 2 diabetes mellitus with hyperglycemia: Secondary | ICD-10-CM

## 2016-07-27 LAB — HEMOGLOBIN A1C: Hemoglobin A1C: 6.1

## 2016-07-27 LAB — LIPID PANEL PICCOLO, WAIVED
Chol/HDL Ratio Piccolo,Waive: 2.8 mg/dL
Cholesterol Piccolo, Waived: 125 mg/dL (ref <200)
HDL Chol Piccolo, Waived: 45 mg/dL — ABNORMAL LOW (ref >59)
LDL CHOL CALC PICCOLO WAIVED: 58 mg/dL (ref <100)
Triglycerides Piccolo,Waived: 109 mg/dL (ref <150)
VLDL CHOL CALC PICCOLO,WAIVE: 22 mg/dL (ref <30)

## 2016-07-27 LAB — BAYER DCA HB A1C WAIVED: HB A1C (BAYER DCA - WAIVED): 6.1 % (ref <7.0)

## 2016-07-27 MED ORDER — METFORMIN HCL 1000 MG PO TABS: 1000.0000 mg | ORAL_TABLET | Freq: Two times a day (BID) | ORAL | 1 refills | Status: DC

## 2016-07-27 NOTE — Assessment & Plan Note (Addendum)
A1c came back at 6.1. Continue current regimen. Continue to monitor. Continue diet and exercise. Lipids under good control. Recheck 3 months.

## 2016-07-27 NOTE — Progress Notes (Signed)
BP 133/85 (BP Location: Left Arm, Patient Position: Sitting, Cuff Size: Large)   Pulse 92   Temp 97.6 F (36.4 C)   Wt 191 lb (86.6 kg)   SpO2 99%   BMI 30.64 kg/m    Subjective:    Patient ID: Alexis [name], male    DOB: 1986/07/23, 30 y.o.   MRN: [id number]  HPI: Alexis MuttonKenneth L Koppen [name] is a 30 y.o. male  Chief Complaint  Patient presents with  . Diabetes   DIABETES Hypoglycemic episodes:no Polydipsia/polyuria: no Visual disturbance: no Chest pain: no Paresthesias: R foot in the evening  Glucose Monitoring: no  Accucheck frequency: Not Checking Taking Insulin?: no Blood Pressure Monitoring: not checking Retinal Examination: Not up to Date Foot Exam: Up to Date Diabetic Education: Not Completed Pneumovax: Not up to Date Influenza: Not up to Date Aspirin: no  Relevant past medical, surgical, family and social history reviewed and updated as indicated. Interim medical history since our last visit reviewed. Allergies and medications reviewed and updated.  Review of Systems  Constitutional: Negative.   Respiratory: Negative.   Cardiovascular: Negative.   Gastrointestinal: Negative.   Psychiatric/Behavioral: Negative.     Per HPI unless specifically indicated above     Objective:    BP 133/85 (BP Location: Left Arm, Patient Position: Sitting, Cuff Size: Large)   Pulse 92   Temp 97.6 F (36.4 C)   Wt 191 lb (86.6 kg)   SpO2 99%   BMI 30.64 kg/m   Wt Readings from Last 3 Encounters:  07/27/16 191 lb (86.6 kg)  01/20/16 198 lb (89.8 kg)  03/27/15 198 lb (89.8 kg)    Physical Exam  Constitutional: He is oriented to person, place, and time. He appears well-developed and well-nourished. No distress.  HENT:  Head: Normocephalic and atraumatic.  Right Ear: Hearing normal.  Left Ear: Hearing normal.  Nose: Nose normal.  Eyes: Conjunctivae and lids are normal. Right eye exhibits no discharge. Left eye exhibits no discharge. No scleral icterus.    Cardiovascular: Normal rate, regular rhythm, normal heart sounds and intact distal pulses.  Exam reveals no gallop and no friction rub.   No murmur heard. Pulmonary/Chest: Effort normal and breath sounds normal. No respiratory distress. He has no wheezes. He has no rales. He exhibits no tenderness.  Musculoskeletal: Normal range of motion.  Neurological: He is alert and oriented to person, place, and time.  Skin: Skin is warm, dry and intact. No rash noted. No erythema. No pallor.  Psychiatric: He has a normal mood and affect. His speech is normal and behavior is normal. Judgment and thought content normal. Cognition and memory are normal.  Nursing note and vitals reviewed.   Results for orders placed or performed in visit on 07/27/16  Hemoglobin A1c  Result Value Ref Range   Hemoglobin A1C 6.1       Assessment & Plan:   Problem List Items Addressed This Visit      Endocrine   Diabetes type 2, uncontrolled (HCC) - Primary    A1c came back at 6.1. Continue current regimen. Continue to monitor. Continue diet and exercise. Lipids under good control. Recheck 3 months.       Relevant Medications   metFORMIN (GLUCOPHAGE) 1000 MG tablet   Other Relevant Orders   Bayer DCA Hb A1c Waived   Comprehensive metabolic panel   Lipid Panel Piccolo, Waived       Follow up plan: Return in about 6 months (around 01/25/2017) for  DM visit.

## 2016-07-28 LAB — COMPREHENSIVE METABOLIC PANEL
A/G RATIO: 2 (ref 1.2–2.2)
AST: 18 IU/L (ref 0–40)
Albumin: 4.7 g/dL (ref 3.5–5.5)
Alkaline Phosphatase: 81 IU/L (ref 39–117)
BUN/Creatinine Ratio: 15 (ref 9–20)
BUN: 13 mg/dL (ref 6–20)
Bilirubin Total: 0.2 mg/dL (ref 0.0–1.2)
CALCIUM: 9.6 mg/dL (ref 8.7–10.2)
CO2: 23 mmol/L (ref 18–29)
CREATININE: 0.85 mg/dL (ref 0.76–1.27)
Chloride: 100 mmol/L (ref 96–106)
GFR, EST AFRICAN AMERICAN: 133 mL/min/{1.73_m2} (ref >59)
GFR, EST NON AFRICAN AMERICAN: 115 mL/min/{1.73_m2} (ref >59)
Globulin, Total: 2.3 g/dL (ref 1.5–4.5)
Glucose: 128 mg/dL — ABNORMAL HIGH (ref 65–99)
Potassium: 4.7 mmol/L (ref 3.5–5.2)
Sodium: 140 mmol/L (ref 134–144)
TOTAL PROTEIN: 7 g/dL (ref 6.0–8.5)

## 2016-07-30 ENCOUNTER — Encounter: Payer: Self-pay | Admitting: Family Medicine

## 2017-02-06 ENCOUNTER — Other Ambulatory Visit: Payer: Self-pay | Admitting: Family Medicine

## 2017-02-06 MED ORDER — METFORMIN HCL 1000 MG PO TABS: 1000.0000 mg | ORAL_TABLET | Freq: Two times a day (BID) | ORAL | 1 refills | Status: DC

## 2017-03-08 ENCOUNTER — Encounter: Payer: Self-pay | Admitting: Family Medicine

## 2017-03-08 ENCOUNTER — Ambulatory Visit (INDEPENDENT_AMBULATORY_CARE_PROVIDER_SITE_OTHER): Payer: 59 | Admitting: Family Medicine

## 2017-03-08 VITALS — BP 130/96 | HR 92 | Temp 98.0°F | Wt 195.4 lb

## 2017-03-08 DIAGNOSIS — R002 Palpitations: Secondary | ICD-10-CM

## 2017-03-08 DIAGNOSIS — E1165 Type 2 diabetes mellitus with hyperglycemia: Secondary | ICD-10-CM | POA: Diagnosis not present

## 2017-03-08 LAB — HEMOGLOBIN A1C: HEMOGLOBIN A1C: 6.1

## 2017-03-08 MED ORDER — METFORMIN HCL 1000 MG PO TABS: 1000.0000 mg | ORAL_TABLET | Freq: Two times a day (BID) | ORAL | 1 refills | Status: DC

## 2017-03-08 MED ORDER — OMEPRAZOLE 20 MG PO CPDR: 20.0000 mg | DELAYED_RELEASE_CAPSULE | Freq: Every day | ORAL | 1 refills | Status: DC

## 2017-03-08 NOTE — Assessment & Plan Note (Signed)
Under good control with A1c of 6.1. Continue current regimen. Continue to monitor. Call with any concerns. Recheck 6 months.

## 2017-03-08 NOTE — Progress Notes (Signed)
BP (!) 130/96   Pulse 92   Temp 98 F (36.7 C)   Wt 195 lb 6.4 oz (88.6 kg)   SpO2 97%   BMI 31.35 kg/m    Subjective:    Patient ID: Alexis [name], male    DOB: 1986-07-07, 31 y.o.   MRN: [id number]  HPI: Alexis [name] is a 31 y.o. male  Chief Complaint  Patient presents with  . Diabetes  . Gastroesophageal Reflux   DIABETES Hypoglycemic episodes:no Polydipsia/polyuria: no Visual disturbance: no Chest pain: no Paresthesias: no Glucose Monitoring: no  Accucheck frequency: rarely Taking Insulin?: no Blood Pressure Monitoring: not checking Retinal Examination: Not up to Date Foot Exam: Up to Date Diabetic Education: Not Completed Pneumovax: Not up to Date Influenza: Not up to Date Aspirin: no  PALPITATIONS Duration: about a week Symptom description: like bubbles in his chest Duration of episode: 5 seconds Frequency: every 15 minutes Activity when event occurred: driving Related to exertion: no Dyspnea: no Chest pain: no Syncope: no Anxiety/stress: no Nausea/vomiting: no Diaphoresis: no Coronary artery disease: no Congestive heart failure: no Arrhythmia:no Thyroid disease: no Caffeine intake: Heavy caffeine consumption Status:  fluctuating Treatments attempted:none  Relevant past medical, surgical, family and social history reviewed and updated as indicated. Interim medical history since our last visit reviewed. Allergies and medications reviewed and updated.  Review of Systems  Constitutional: Negative.   Respiratory: Negative.   Cardiovascular: Positive for palpitations. Negative for chest pain and leg swelling.  Gastrointestinal: Negative for abdominal distention, abdominal pain, anal bleeding, blood in stool, constipation, diarrhea, nausea, rectal pain and vomiting.       Having a lot of indigestion   Psychiatric/Behavioral: Negative.     Per HPI unless specifically indicated above     Objective:    BP (!) 130/96   Pulse  92   Temp 98 F (36.7 C)   Wt 195 lb 6.4 oz (88.6 kg)   SpO2 97%   BMI 31.35 kg/m   Wt Readings from Last 3 Encounters:  03/08/17 195 lb 6.4 oz (88.6 kg)  07/27/16 191 lb (86.6 kg)  01/20/16 198 lb (89.8 kg)    Physical Exam  Constitutional: He is oriented to person, place, and time. He appears well-developed and well-nourished. No distress.  HENT:  Head: Normocephalic and atraumatic.  Right Ear: Hearing normal.  Left Ear: Hearing normal.  Nose: Nose normal.  Eyes: Conjunctivae and lids are normal. Right eye exhibits no discharge. Left eye exhibits no discharge. No scleral icterus.  Cardiovascular: Normal rate, regular rhythm, normal heart sounds and intact distal pulses.  Exam reveals no gallop and no friction rub.   No murmur heard. Pulmonary/Chest: Effort normal and breath sounds normal. No respiratory distress. He has no wheezes. He has no rales. He exhibits no tenderness.  Musculoskeletal: Normal range of motion.  Neurological: He is alert and oriented to person, place, and time.  Skin: Skin is warm, dry and intact. No rash noted. He is not diaphoretic. No erythema. No pallor.  Psychiatric: He has a normal mood and affect. His speech is normal and behavior is normal. Judgment and thought content normal. Cognition and memory are normal.  Nursing note and vitals reviewed.   Results for orders placed or performed in visit on 03/08/17  Hemoglobin A1c  Result Value Ref Range   Hemoglobin A1C 6.1       Assessment & Plan:   Problem List Items Addressed This Visit  Endocrine   Diabetes type 2, uncontrolled (HCC) - Primary    Under good control with A1c of 6.1. Continue current regimen. Continue to monitor. Call with any concerns. Recheck 6 months.       Relevant Medications   metFORMIN (GLUCOPHAGE) 1000 MG tablet   Other Relevant Orders   Bayer DCA Hb A1c Waived   Microalbumin, Urine Waived    Other Visit Diagnoses    Palpitations       Likely due to indigestion.  Will start omeprazole. Call with any concerns. EKG normal.    Relevant Orders   EKG 12-Lead (Completed)       Follow up plan: Return in about 6 months (around 09/08/2017) for Physical.

## 2017-03-12 ENCOUNTER — Ambulatory Visit: Payer: Self-pay | Admitting: Family Medicine

## 2017-03-12 LAB — MICROALBUMIN, URINE WAIVED
CREATININE, URINE WAIVED: 200 mg/dL (ref 10–300)
MICROALB, UR WAIVED: 30 mg/L — AB (ref 0–19)
Microalb/Creat Ratio: <30 mg/g (ref <30)

## 2017-03-12 LAB — BAYER DCA HB A1C WAIVED: HB A1C: 6.1 % (ref <7.0)

## 2017-08-30 ENCOUNTER — Encounter: Payer: Self-pay | Admitting: Family Medicine

## 2017-08-30 ENCOUNTER — Ambulatory Visit (INDEPENDENT_AMBULATORY_CARE_PROVIDER_SITE_OTHER): Payer: 59 | Admitting: Family Medicine

## 2017-08-30 VITALS — BP 130/86 | HR 102 | Ht 67.3 in | Wt 191.3 lb

## 2017-08-30 DIAGNOSIS — K649 Unspecified hemorrhoids: Secondary | ICD-10-CM

## 2017-08-30 DIAGNOSIS — Z113 Encounter for screening for infections with a predominantly sexual mode of transmission: Secondary | ICD-10-CM

## 2017-08-30 DIAGNOSIS — Z0000 Encounter for general adult medical examination without abnormal findings: Secondary | ICD-10-CM

## 2017-08-30 DIAGNOSIS — Z23 Encounter for immunization: Secondary | ICD-10-CM

## 2017-08-30 DIAGNOSIS — E1165 Type 2 diabetes mellitus with hyperglycemia: Secondary | ICD-10-CM

## 2017-08-30 LAB — UA/M W/RFLX CULTURE, ROUTINE
Bilirubin, UA: NEGATIVE
Glucose, UA: NEGATIVE
LEUKOCYTES UA: NEGATIVE
Nitrite, UA: NEGATIVE
PH UA: 5.5 (ref 5.0–7.5)
Protein, UA: NEGATIVE
Specific Gravity, UA: >1.03 — ABNORMAL HIGH (ref 1.005–1.030)
Urobilinogen, Ur: 0.2 mg/dL (ref 0.2–1.0)

## 2017-08-30 LAB — MICROSCOPIC EXAMINATION: BACTERIA UA: NONE SEEN

## 2017-08-30 LAB — BAYER DCA HB A1C WAIVED: HB A1C: 6.2 % (ref <7.0)

## 2017-08-30 LAB — MICROALBUMIN, URINE WAIVED
Creatinine, Urine Waived: 300 mg/dL (ref 10–300)
MICROALB, UR WAIVED: 80 mg/L — AB (ref 0–19)

## 2017-08-30 MED ORDER — METFORMIN HCL 1000 MG PO TABS: 1000.0000 mg | ORAL_TABLET | Freq: Two times a day (BID) | ORAL | 1 refills | Status: DC

## 2017-08-30 MED ORDER — OMEPRAZOLE 20 MG PO CPDR: 20.0000 mg | DELAYED_RELEASE_CAPSULE | Freq: Every day | ORAL | 1 refills | Status: DC

## 2017-08-30 MED ORDER — HYDROCORTISONE 2.5 % RE CREA: 1.0000 "application " | TOPICAL_CREAM | Freq: Two times a day (BID) | RECTAL | 3 refills | Status: DC

## 2017-08-30 NOTE — Assessment & Plan Note (Signed)
Under good control with A1c of 6.2. Continue metformin. Call with any concerns.

## 2017-08-30 NOTE — Patient Instructions (Addendum)
Health Maintenance, Male A healthy lifestyle and preventive care is important for your health and wellness. Ask your health care provider about what schedule of regular examinations is right for you. What should I know about weight and diet? Eat a Healthy Diet  Eat plenty of vegetables, fruits, whole grains, low-fat dairy products, and lean protein.  Do not eat a lot of foods high in solid fats, added sugars, or salt.  Maintain a Healthy Weight Regular exercise can help you achieve or maintain a healthy weight. You should:  Do at least 150 minutes of exercise each week. The exercise should increase your heart rate and make you sweat (moderate-intensity exercise).  Do strength-training exercises at least twice a week.  Watch Your Levels of Cholesterol and Blood Lipids  Have your blood tested for lipids and cholesterol every 5 years starting at 35 years of age. If you are at high risk for heart disease, you should start having your blood tested when you are 34 years old. You may need to have your cholesterol levels checked more often if: ? Your lipid or cholesterol levels are high. ? You are older than 34 years of age. ? You are at high risk for heart disease.  What should I know about cancer screening? Many types of cancers can be detected early and may often be prevented. Lung Cancer  You should be screened every year for lung cancer if: ? You are a current smoker who has smoked for at least 30 years. ? You are a former smoker who has quit within the past 15 years.  Talk to your health care provider about your screening options, when you should start screening, and how often you should be screened.  Colorectal Cancer  Routine colorectal cancer screening usually begins at 34 years of age and should be repeated every 5-10 years until you are 34 years old. You may need to be screened more often if early forms of precancerous polyps or small growths are found. Your health care provider  may recommend screening at an earlier age if you have risk factors for colon cancer.  Your health care provider may recommend using home test kits to check for hidden blood in the stool.  A small camera at the end of a tube can be used to examine your colon (sigmoidoscopy or colonoscopy). This checks for the earliest forms of colorectal cancer.  Prostate and Testicular Cancer  Depending on your age and overall health, your health care provider may do certain tests to screen for prostate and testicular cancer.  Talk to your health care provider about any symptoms or concerns you have about testicular or prostate cancer.  Skin Cancer  Check your skin from head to toe regularly.  Tell your health care provider about any new moles or changes in moles, especially if: ? There is a change in a mole's size, shape, or color. ? You have a mole that is larger than a pencil eraser.  Always use sunscreen. Apply sunscreen liberally and repeat throughout the day.  Protect yourself by wearing long sleeves, pants, a wide-brimmed hat, and sunglasses when outside.  What should I know about heart disease, diabetes, and high blood pressure?  If you are 18-39 years of age, have your blood pressure checked every 3-5 years. If you are 40 years of age or older, have your blood pressure checked every year. You should have your blood pressure measured twice-once when you are at a hospital or clinic, and once when   you are not at a hospital or clinic. Record the average of the two measurements. To check your blood pressure when you are not at a hospital or clinic, you can use: ? An automated blood pressure machine at a pharmacy. ? A home blood pressure monitor.  Talk to your health care provider about your target blood pressure.  If you are between 45-79 years old, ask your health care provider if you should take aspirin to prevent heart disease.  Have regular diabetes screenings by checking your fasting blood  sugar level. ? If you are at a normal weight and have a low risk for diabetes, have this test once every three years after the age of 45. ? If you are overweight and have a high risk for diabetes, consider being tested at a younger age or more often.  A one-time screening for abdominal aortic aneurysm (AAA) by ultrasound is recommended for men aged 65-75 years who are current or former smokers. What should I know about preventing infection? Hepatitis B If you have a higher risk for hepatitis B, you should be screened for this virus. Talk with your health care provider to find out if you are at risk for hepatitis B infection. Hepatitis C Blood testing is recommended for:  Everyone born from 1945 through 1965.  Anyone with known risk factors for hepatitis C.  Sexually Transmitted Diseases (STDs)  You should be screened each year for STDs including gonorrhea and chlamydia if: ? You are sexually active and are younger than 34 years of age. ? You are older than 34 years of age and your health care provider tells you that you are at risk for this type of infection. ? Your sexual activity has changed since you were last screened and you are at an increased risk for chlamydia or gonorrhea. Ask your health care provider if you are at risk.  Talk with your health care provider about whether you are at high risk of being infected with HIV. Your health care provider may recommend a prescription medicine to help prevent HIV infection.  What else can I do?  Schedule regular health, dental, and eye exams.  Stay current with your vaccines (immunizations).  Do not use any tobacco products, such as cigarettes, chewing tobacco, and e-cigarettes. If you need help quitting, ask your health care provider.  Limit alcohol intake to no more than 2 drinks per day. One drink equals 12 ounces of beer, 5 ounces of wine, or 1 ounces of hard liquor.  Do not use street drugs.  Do not share needles.  Ask your  health care provider for help if you need support or information about quitting drugs.  Tell your health care provider if you often feel depressed.  Tell your health care provider if you have ever been abused or do not feel safe at home. This information is not intended to replace advice given to you by your health care provider. Make sure you discuss any questions you have with your health care provider. Document Released: 01/26/2008 Document Revised: 03/28/2016 Document Reviewed: 05/03/2015 Elsevier Interactive Patient Education  2018 [employer] Pneumococcal Polysaccharide Vaccine: What You Need to Know 1. Why get vaccinated? Vaccination can protect older adults (and some children and younger adults) from pneumococcal disease. Pneumococcal disease is caused by bacteria that can spread from person to person through close contact. It can cause ear infections, and it can also lead to more serious infections of the:  Lungs (pneumonia),  Blood (bacteremia), and    Covering of the brain and spinal cord (meningitis). Meningitis can cause deafness and brain damage, and it can be fatal.  Anyone can get pneumococcal disease, but children under 2 years of age, people with certain medical conditions, adults over 65 years of age, and cigarette smokers are at the highest risk. About 18,000 older adults die each year from pneumococcal disease in the [place]. Treatment of pneumococcal infections with penicillin and other drugs used to be more effective. But some strains of the disease have become resistant to these drugs. This makes prevention of the disease, through vaccination, even more important. 2. Pneumococcal polysaccharide vaccine (PPSV23) Pneumococcal polysaccharide vaccine (PPSV23) protects against 23 types of pneumococcal bacteria. It will not prevent all pneumococcal disease. PPSV23 is recommended for:  All adults 65 years of age and older,  Anyone 2 through 34 years of age with  certain long-term health problems,  Anyone 2 through 34 years of age with a weakened immune system,  Adults 19 through 34 years of age who smoke cigarettes or have asthma.  Most people need only one dose of PPSV. A second dose is recommended for certain high-risk groups. People 65 and older should get a dose even if they have gotten one or more doses of the vaccine before they turned 65. Your healthcare provider can give you more information about these recommendations. Most healthy adults develop protection within 2 to 3 weeks of getting the shot. 3. Some people should not get this vaccine  Anyone who has had a life-threatening allergic reaction to PPSV should not get another dose.  Anyone who has a severe allergy to any component of PPSV should not receive it. Tell your provider if you have any severe allergies.  Anyone who is moderately or severely ill when the shot is scheduled may be asked to wait until they recover before getting the vaccine. Someone with a mild illness can usually be vaccinated.  Children less than 2 years of age should not receive this vaccine.  There is no evidence that PPSV is harmful to either a pregnant woman or to her fetus. However, as a precaution, women who need the vaccine should be vaccinated before becoming pregnant, if possible. 4. Risks of a vaccine reaction With any medicine, including vaccines, there is a chance of side effects. These are usually mild and go away on their own, but serious reactions are also possible. About half of people who get PPSV have mild side effects, such as redness or pain where the shot is given, which go away within about two days. Less than 1 out of 100 people develop a fever, muscle aches, or more severe local reactions. Problems that could happen after any vaccine:  People sometimes faint after a medical procedure, including vaccination. Sitting or lying down for about 15 minutes can help prevent fainting, and injuries  caused by a fall. Tell your doctor if you feel dizzy, or have vision changes or ringing in the ears.  Some people get severe pain in the shoulder and have difficulty moving the arm where a shot was given. This happens very rarely.  Any medication can cause a severe allergic reaction. Such reactions from a vaccine are very rare, estimated at about 1 in a million doses, and would happen within a few minutes to a few hours after the vaccination. As with any medicine, there is a very remote chance of a vaccine causing a serious injury or death. The safety of vaccines is always being monitored. For   more information, visit: www.cdc.gov/vaccinesafety/ 5. What if there is a serious reaction? What should I look for? Look for anything that concerns you, such as signs of a severe allergic reaction, very high fever, or unusual behavior. Signs of a severe allergic reaction can include hives, swelling of the face and throat, difficulty breathing, a fast heartbeat, dizziness, and weakness. These would usually start a few minutes to a few hours after the vaccination. What should I do? If you think it is a severe allergic reaction or other emergency that can't wait, call 9-1-1 or get to the nearest hospital. Otherwise, call your doctor. Afterward, the reaction should be reported to the Vaccine Adverse Event Reporting System (VAERS). Your doctor might file this report, or you can do it yourself through the VAERS web site at www.vaers.hhs.gov, or by calling 1-800-822-7967. VAERS does not give medical advice. 6. How can I learn more?  Ask your doctor. He or she can give you the vaccine package insert or suggest other sources of information.  Call your local or state health department.  Contact the Centers for Disease Control and Prevention (CDC): ? Call 1-800-232-4636 (1-800-CDC-INFO) or ? Visit CDC's website at www.cdc.gov/vaccines CDC Pneumococcal Polysaccharide Vaccine VIS (12/04/13) This information is not  intended to replace advice given to you by your health care provider. Make sure you discuss any questions you have with your health care provider. Document Released: 05/27/2006 Document Revised: 04/19/2016 Document Reviewed: 04/19/2016 Elsevier Interactive Patient Education  2017 [employer] Inc.  

## 2017-08-30 NOTE — Progress Notes (Signed)
BP 130/86   Pulse (!) 102   Ht 5' 7.3" (1.709 m)   Wt 191 lb 4.8 oz (86.8 kg)   SpO2 96%   BMI 29.70 kg/m    Subjective:    Patient ID: Alexis [name], male    DOB: Apr 14, 1986, 32 y.o.   MRN: [id number]  HPI: Alexis MuttonKenneth L Hara [name] is a 32 y.o. male presenting on 08/30/2017 for comprehensive medical examination. Current medical complaints include:  DIABETES Hypoglycemic episodes:no Polydipsia/polyuria: no Visual disturbance: no Chest pain: no Paresthesias: no Glucose Monitoring: no  Accucheck frequency: Not Checking Taking Insulin?: no Blood Pressure Monitoring: not checking Retinal Examination: Not up to Date Foot Exam: Up to Date Diabetic Education: Not Completed Pneumovax: Given today Influenza: Refused Aspirin: no  He currently lives with: fiance, and 2 daughters Interim Problems from his last visit: no  Depression Screen done today and results listed below:  [facility]2017 07/27/2016  Decreased Interest 0 0  Down, Depressed, Hopeless 0 0  PHQ - 2 Score 0 0  Altered sleeping 0 -  Tired, decreased energy 0 -  Change in appetite 0 -  Feeling bad or failure about yourself  0 -  Trouble concentrating 0 -  Moving slowly or fidgety/restless 0 -  Suicidal thoughts 0 -  PHQ-9 Score 0 -   Past Medical History:  Past Medical History:  Diagnosis Date  . Acute renal failure (HCC)   . Diabetes type 2, uncontrolled (HCC) 01/20/2016    Surgical History:  Past Surgical History:  Procedure Laterality Date  . TONSILLECTOMY    . TYMPANOSTOMY TUBE PLACEMENT      Medications:  No current outpatient medications on file prior to visit.   No current facility-administered medications on file prior to visit.     Allergies:  No Known Allergies  Social History:  Social History   Socioeconomic History  . Marital status: Single    Spouse name: Not on file  . Number of children: Not on file  . Years of education: Not on file  . Highest  education level: Not on file  Social Needs  . Financial resource strain: Not on file  . Food insecurity - worry: Not on file  . Food insecurity - inability: Not on file  . Transportation needs - medical: Not on file  . Transportation needs - non-medical: Not on file  Occupational History  . Not on file  Tobacco Use  . Smoking status: Current Every Day Smoker    Packs/day: 0.50    Types: Cigarettes  . Smokeless tobacco: Never Used  Substance and Sexual Activity  . Alcohol use: No  . Drug use: Yes    Types: Marijuana  . Sexual activity: Yes    Birth control/protection: None  Other Topics Concern  . Not on file  Social History Narrative  . Not on file   Social History   Tobacco Use  Smoking Status Current Every Day Smoker  . Packs/day: 0.50  . Types: Cigarettes  Smokeless Tobacco Never Used   Social History   Substance and Sexual Activity  Alcohol Use No    Family History:  Family History  Problem Relation Age of Onset  . Hyperlipidemia Mother   . Hypertension Mother   . Diabetes Father   . Diabetes Maternal Grandmother        Lung    Past medical history, surgical history, medications, allergies, family history and social history reviewed with patient today and  changes made to appropriate areas of the chart.   Review of Systems  Constitutional: Negative.   HENT: Negative.   Eyes: Positive for blurred vision. Negative for double vision, photophobia, pain, discharge and redness.  Respiratory: Negative.   Cardiovascular: Negative.   Gastrointestinal: Positive for diarrhea and heartburn. Negative for abdominal pain, blood in stool, constipation, melena, nausea and vomiting.  Genitourinary: Negative.   Musculoskeletal: Negative.   Skin: Negative.   Neurological: Negative.   Endo/Heme/Allergies: Negative.   Psychiatric/Behavioral: Negative.     All other ROS negative except what is listed above and in the HPI.      Objective:    BP 130/86   Pulse (!)  102   Ht 5' 7.3" (1.709 m)   Wt 191 lb 4.8 oz (86.8 kg)   SpO2 96%   BMI 29.70 kg/m   Wt Readings from Last 3 Encounters:  08/30/17 191 lb 4.8 oz (86.8 kg)  03/08/17 195 lb 6.4 oz (88.6 kg)  07/27/16 191 lb (86.6 kg)    Physical Exam  Results for orders placed or performed in visit on 03/08/17  Bayer DCA Hb A1c Waived  Result Value Ref Range   Bayer DCA Hb A1c Waived 6.1 <7.0 %  Microalbumin, Urine Waived  Result Value Ref Range   Microalb, Ur Waived 30 (H) 0 - 19 mg/L   Creatinine, Urine Waived 200 10 - 300 mg/dL   Microalb/Creat Ratio <30 <30 mg/g  Hemoglobin A1c  Result Value Ref Range   Hemoglobin A1C 6.1       Assessment & Plan:   Problem List Items Addressed This Visit      Endocrine   Controlled type 2 diabetes mellitus (HCC)    Under good control with A1c of 6.2. Continue metformin. Call with any concerns.       Relevant Medications   metFORMIN (GLUCOPHAGE) 1000 MG tablet    Other Visit Diagnoses    Routine general medical examination at a health care facility    -  Primary   Vaccines updated. Screening labs checked today. Continue diet and exercise. Call with any concerns.    Relevant Orders   CBC with Differential/Platelet   Comprehensive metabolic panel   Lipid Panel w/o Chol/HDL Ratio   Microalbumin, Urine Waived   TSH   UA/M w/rflx Culture, Routine   Routine screening for STI (sexually transmitted infection)       Labs drawn today. Await results.    Relevant Orders   HIV antibody   GC/Chlamydia Probe Amp   Hepatitis, Acute   HSV(herpes simplex vrs) 1+2 ab-IgG   RPR   Hemorrhoids, unspecified hemorrhoid type       Will treat with anusol. Call with any concerns.        LABORATORY TESTING:  Health maintenance labs ordered today as discussed above.   IMMUNIZATIONS:   - Tdap: Tetanus vaccination status reviewed: last tetanus booster within 10 years. - Influenza: Refused - Pneumovax: Administered today  PATIENT COUNSELING:    Sexuality:  Discussed sexually transmitted diseases, partner selection, use of condoms, avoidance of unintended pregnancy  and contraceptive alternatives.   Advised to avoid cigarette smoking.  I discussed with the patient that most people either abstain from alcohol or drink within safe limits (<=14/week and <=4 drinks/occasion for males, <=7/weeks and <= 3 drinks/occasion for females) and that the risk for alcohol disorders and other health effects rises proportionally with the number of drinks per week and how often a drinker exceeds daily  limits.  Discussed cessation/primary prevention of drug use and availability of treatment for abuse.   Diet: Encouraged to adjust caloric intake to maintain  or achieve ideal body weight, to reduce intake of dietary saturated fat and total fat, to limit sodium intake by avoiding high sodium foods and not adding table salt, and to maintain adequate dietary potassium and calcium preferably from fresh fruits, vegetables, and low-fat dairy products.    stressed the importance of regular exercise  Injury prevention: Discussed safety belts, safety helmets, smoke detector, smoking near bedding or upholstery.   Dental health: Discussed importance of regular tooth brushing, flossing, and dental visits.   Follow up plan: NEXT PREVENTATIVE PHYSICAL DUE IN 1 YEAR. Return in about 6 months (around 02/27/2018) for Follow up DM.

## 2017-08-31 LAB — CBC WITH DIFFERENTIAL/PLATELET
BASOS: 0 %
Basophils Absolute: 0 10*3/uL (ref 0.0–0.2)
EOS (ABSOLUTE): 0.2 10*3/uL (ref 0.0–0.4)
EOS: 3 %
HEMATOCRIT: 43.3 % (ref 37.5–51.0)
Hemoglobin: 14.2 g/dL (ref 13.0–17.7)
IMMATURE GRANS (ABS): 0 10*3/uL (ref 0.0–0.1)
IMMATURE GRANULOCYTES: 0 %
LYMPHS: 31 %
Lymphocytes Absolute: 2.3 10*3/uL (ref 0.7–3.1)
MCH: 28.7 pg (ref 26.6–33.0)
MCHC: 32.8 g/dL (ref 31.5–35.7)
MCV: 88 fL (ref 79–97)
MONOCYTES: 8 %
MONOS ABS: 0.6 10*3/uL (ref 0.1–0.9)
NEUTROS PCT: 58 %
Neutrophils Absolute: 4.2 10*3/uL (ref 1.4–7.0)
Platelets: 282 10*3/uL (ref 150–379)
RBC: 4.95 x10E6/uL (ref 4.14–5.80)
RDW: 13.8 % (ref 12.3–15.4)
WBC: 7.3 10*3/uL (ref 3.4–10.8)

## 2017-08-31 LAB — COMPREHENSIVE METABOLIC PANEL
A/G RATIO: 1.9 (ref 1.2–2.2)
AST: 17 IU/L (ref 0–40)
Albumin: 4.4 g/dL (ref 3.5–5.5)
Alkaline Phosphatase: 107 IU/L (ref 39–117)
BUN/Creatinine Ratio: 15 (ref 9–20)
BUN: 14 mg/dL (ref 6–20)
Bilirubin Total: 0.3 mg/dL (ref 0.0–1.2)
CALCIUM: 9.2 mg/dL (ref 8.7–10.2)
CO2: 21 mmol/L (ref 20–29)
CREATININE: 0.92 mg/dL (ref 0.76–1.27)
Chloride: 101 mmol/L (ref 96–106)
GFR, EST AFRICAN AMERICAN: 126 mL/min/{1.73_m2} (ref >59)
GFR, EST NON AFRICAN AMERICAN: 109 mL/min/{1.73_m2} (ref >59)
GLOBULIN, TOTAL: 2.3 g/dL (ref 1.5–4.5)
Glucose: 179 mg/dL — ABNORMAL HIGH (ref 65–99)
Potassium: 4.6 mmol/L (ref 3.5–5.2)
SODIUM: 140 mmol/L (ref 134–144)
TOTAL PROTEIN: 6.7 g/dL (ref 6.0–8.5)

## 2017-08-31 LAB — LIPID PANEL W/O CHOL/HDL RATIO
Cholesterol, Total: 132 mg/dL (ref 100–199)
HDL: 41 mg/dL (ref >39)
LDL CALC: 57 mg/dL (ref 0–99)
TRIGLYCERIDES: 168 mg/dL — AB (ref 0–149)
VLDL Cholesterol Cal: 34 mg/dL (ref 5–40)

## 2017-08-31 LAB — HSV(HERPES SIMPLEX VRS) I + II AB-IGG: HSV 1 Glycoprotein G Ab, IgG: 21.2 index — ABNORMAL HIGH (ref 0.00–0.90)

## 2017-08-31 LAB — HEPATITIS PANEL, ACUTE
HEP A IGM: NEGATIVE
Hep B C IgM: NEGATIVE
Hep C Virus Ab: <0.1 s/co ratio (ref 0.0–0.9)
Hepatitis B Surface Ag: NEGATIVE

## 2017-08-31 LAB — HIV ANTIBODY (ROUTINE TESTING W REFLEX): HIV SCREEN 4TH GENERATION: NONREACTIVE

## 2017-08-31 LAB — RPR: RPR Ser Ql: NONREACTIVE

## 2017-08-31 LAB — TSH: TSH: 0.864 u[IU]/mL (ref 0.450–4.500)

## 2017-09-02 ENCOUNTER — Telehealth: Payer: Self-pay | Admitting: Family Medicine

## 2017-09-02 NOTE — Telephone Encounter (Signed)
Please let him know that he tested positive for the cold sore virus. Everything else was normal. We're still waiting on the GC/chlamydia.

## 2017-09-02 NOTE — Telephone Encounter (Signed)
Patient notified

## 2017-09-03 ENCOUNTER — Telehealth: Payer: Self-pay | Admitting: Family Medicine

## 2017-09-03 LAB — GC/CHLAMYDIA PROBE AMP
Chlamydia trachomatis, NAA: NEGATIVE
Neisseria gonorrhoeae by PCR: NEGATIVE

## 2017-09-03 NOTE — Telephone Encounter (Signed)
Please let him know that his GC/chlamydia came back negative. Thanks! 

## 2017-09-04 NOTE — Telephone Encounter (Signed)
Patient notified

## 2017-10-21 ENCOUNTER — Ambulatory Visit (INDEPENDENT_AMBULATORY_CARE_PROVIDER_SITE_OTHER): Payer: 59 | Admitting: Surgery

## 2017-10-21 ENCOUNTER — Ambulatory Visit (INDEPENDENT_AMBULATORY_CARE_PROVIDER_SITE_OTHER): Payer: 59 | Admitting: Family Medicine

## 2017-10-21 ENCOUNTER — Encounter: Payer: Self-pay | Admitting: Family Medicine

## 2017-10-21 ENCOUNTER — Encounter: Payer: Self-pay | Admitting: Surgery

## 2017-10-21 VITALS — BP 132/87 | HR 71 | Temp 97.6°F | Wt 192.5 lb

## 2017-10-21 VITALS — BP 136/84 | HR 68 | Temp 98.0°F | Ht 67.3 in | Wt 192.0 lb

## 2017-10-21 DIAGNOSIS — K645 Perianal venous thrombosis: Secondary | ICD-10-CM

## 2017-10-21 DIAGNOSIS — K644 Residual hemorrhoidal skin tags: Secondary | ICD-10-CM | POA: Diagnosis not present

## 2017-10-21 MED ORDER — HYDROCORTISONE ACETATE 25 MG RE SUPP: 25.0000 mg | Freq: Two times a day (BID) | RECTAL | 0 refills | Status: DC

## 2017-10-21 NOTE — Progress Notes (Signed)
Alexis MuttonKenneth L Deschamps [name] is an 32 y.o. male.   Chief Complaint: Thrombosed hemorrhoid HPI: [facility] thrombosed hemorrhoid. Since Friday he has had severe pain. No prior episode. Has not used any medications.  He has not attempted utilization of any medications.  He has had this happen multiple times but usually smaller.  He is experience burning pain and bleeding.  Past Medical History:  Diagnosis Date  . Acute renal failure (HCC)   . Diabetes type 2, uncontrolled (HCC) 01/20/2016    Past Surgical History:  Procedure Laterality Date  . TONSILLECTOMY    . TYMPANOSTOMY TUBE PLACEMENT      Family History  Problem Relation Age of Onset  . Hyperlipidemia Mother   . Hypertension Mother   . Diabetes Father   . Diabetes Maternal Grandmother        Lung  No family history of colon cancer Social History:  reports that he has been smoking cigarettes.  He has been smoking about 0.50 packs per day. he has never used smokeless tobacco. He reports that he uses drugs. Drug: Marijuana. He reports that he does not drink alcohol.  Patient works as a Nutritional therapistplumber Allergies: No Known Allergies   (Not in a hospital admission)   Review of Systems:   Review of Systems  Constitutional: Negative.   HENT: Negative.   Eyes: Negative.   Respiratory: Negative.   Cardiovascular: Negative.   Genitourinary: Negative.   Musculoskeletal: Negative.   Skin: Negative.   Neurological: Negative.   Endo/Heme/Allergies: Negative.   Psychiatric/Behavioral: Negative.     Physical Exam:  Physical Exam  Constitutional: He is well-developed, well-nourished, and in no distress. No distress.  HENT:  Head: Normocephalic and atraumatic.  Eyes: Pupils are equal, round, and reactive to light. Right eye exhibits no discharge. Left eye exhibits no discharge. No scleral icterus.  Neck: Normal range of motion.  Pulmonary/Chest: Effort normal. No respiratory  distress.  Abdominal: Soft. He exhibits no distension.  Genitourinary:  Genitourinary Comments: Large thrombosed hemorrhoid with large eschar measuring approximately 6 x 8 mm with draining blood.  Minimal ecchymosis minimal tenderness  Musculoskeletal: Normal range of motion. He exhibits no edema or tenderness.  Lymphadenopathy:    He has no cervical adenopathy.  Skin: Skin is warm and dry. He is not diaphoretic. No erythema.  Vitals reviewed.   There were no vitals taken for this visit.    No results found for this or any previous visit (from the past 48 hour(s)). No results found.   Assessment/Plan Thrombosed hemorrhoid several days old.  It is already drained with a large eschar present.  Beatriz ChancellorLansing this at this point would not add anything to his course.  Discussed medical treatment.  Follow-up on Thursday.  Lattie Hawichard E Eustacio Ellen, MD, FACS

## 2017-10-21 NOTE — Patient Instructions (Signed)
Please try Preparation H. Twice a day.     Hemorrhoids Hemorrhoids are swollen veins in and around the rectum or anus. Hemorrhoids can cause pain, itching, or bleeding. Most of the time, they do not cause serious problems. They usually get better with diet changes, lifestyle changes, and other home treatments. Follow these instructions at home: Eating and drinking  Eat foods that have fiber, such as whole grains, beans, nuts, fruits, and vegetables. Ask your doctor about taking products that have added fiber (fibersupplements).  Drink enough fluid to keep your pee (urine) clear or pale yellow. For Pain and Swelling  Take a warm-water bath (sitz bath) for 20 minutes to ease pain. Do this 3-4 times a day.  If directed, put ice on the painful area. It may be helpful to use ice between your warm baths. ? Put ice in a plastic bag. ? Place a towel between your skin and the bag. ? Leave the ice on for 20 minutes, 2-3 times a day. General instructions  Take over-the-counter and prescription medicines only as told by your doctor. ? Medicated creams and medicines that are inserted into the anus (suppositories) may be used or applied as told.  Exercise often.  Go to the bathroom when you have the urge to poop (to have a bowel movement). Do not wait.  Avoid pushing too hard (straining) when you poop.  Keep the butt area dry and clean. Use wet toilet paper or moist paper towels.  Do not sit on the toilet for a long time. Contact a doctor if:  You have any of these: ? Pain and swelling that do not get better with treatment or medicine. ? Bleeding that will not stop. ? Trouble pooping or you cannot poop. ? Pain or swelling outside the area of the hemorrhoids. This information is not intended to replace advice given to you by your health care provider. Make sure you discuss any questions you have with your health care provider. Document Released: 05/08/2008 Document Revised: 01/05/2016  Document Reviewed: 04/13/2015 Elsevier Interactive Patient Education  [employer]2018 Elsevier Inc.

## 2017-10-21 NOTE — Progress Notes (Signed)
BP 132/87   Pulse 71   Temp 97.6 F (36.4 C) (Oral)   Wt 192 lb 8 oz (87.3 kg)   SpO2 100%   BMI 29.88 kg/m    Subjective:    Patient ID: Alexis [name], male    DOB: 1986-08-13, 32 y.o.   MRN: [id number]  HPI: Alexis [name] is a 32 y.o. male  Chief Complaint  Patient presents with  . Hemorrhoids   HEMORRHOIDS Duration: 4 days Anal fullness: yes Perianal itching/irritation: yes Perianal pain: yes Bright red rectal bleeding: yes Amount of blood: severe Frequency: constant Constipation: no Hard stools: no Chronic straining/valsava: yes Status: worse Treatments attempted: OTC medicated wipes and hydrocortisone cream  Previous hemorrhoids: yes  Colonoscopy: no   Relevant past medical, surgical, family and social history reviewed and updated as indicated. Interim medical history since our last visit reviewed. Allergies and medications reviewed and updated.  Review of Systems  Constitutional: Negative.   Respiratory: Negative.   Cardiovascular: Negative.   Gastrointestinal: Positive for anal bleeding, blood in stool and rectal pain. Negative for abdominal distention, abdominal pain, constipation, diarrhea, nausea and vomiting.  Psychiatric/Behavioral: Negative.     Per HPI unless specifically indicated above     Objective:    BP 132/87   Pulse 71   Temp 97.6 F (36.4 C) (Oral)   Wt 192 lb 8 oz (87.3 kg)   SpO2 100%   BMI 29.88 kg/m   Wt Readings from Last 3 Encounters:  10/21/17 192 lb 8 oz (87.3 kg)  08/30/17 191 lb 4.8 oz (86.8 kg)  03/08/17 195 lb 6.4 oz (88.6 kg)    Physical Exam  Constitutional: He is oriented to person, place, and time. He appears well-developed and well-nourished. No distress.  HENT:  Head: Normocephalic and atraumatic.  Right Ear: Hearing normal.  Left Ear: Hearing normal.  Nose: Nose normal.  Eyes: Conjunctivae and lids are normal. Right eye exhibits no discharge. Left eye exhibits no discharge. No scleral  icterus.  Cardiovascular: Normal rate, regular rhythm, normal heart sounds and intact distal pulses. Exam reveals no gallop and no friction rub.  No murmur heard. Pulmonary/Chest: Effort normal and breath sounds normal. No respiratory distress. He has no wheezes. He has no rales. He exhibits no tenderness.  Genitourinary: Rectal exam shows external hemorrhoid.     Musculoskeletal: Normal range of motion.  Neurological: He is alert and oriented to person, place, and time.  Skin: Skin is warm, dry and intact. No rash noted. He is not diaphoretic. No erythema. No pallor.  Psychiatric: He has a normal mood and affect. His speech is normal and behavior is normal. Judgment and thought content normal. Cognition and memory are normal.  Nursing note and vitals reviewed.   Results for orders placed or performed in visit on 08/30/17  GC/Chlamydia Probe Amp  Result Value Ref Range   Chlamydia trachomatis, NAA Negative Negative   Neisseria gonorrhoeae by PCR Negative Negative  Microscopic Examination  Result Value Ref Range   WBC, UA 0-5 0 - 5 /hpf   RBC, UA 0-2 0 - 2 /hpf   Epithelial Cells (non renal) CANCELED    Mucus, UA Present Not Estab.   Bacteria, UA None seen None seen/Few  Bayer DCA Hb A1c Waived  Result Value Ref Range   Bayer DCA Hb A1c Waived 6.2 <7.0 %  CBC with Differential/Platelet  Result Value Ref Range   WBC 7.3 3.4 - 10.8 x10E3/uL   RBC  4.95 4.14 - 5.80 x10E6/uL   Hemoglobin 14.2 13.0 - 17.7 g/dL   Hematocrit 16.1 09.6 - 51.0 %   MCV 88 79 - 97 fL   MCH 28.7 26.6 - 33.0 pg   MCHC 32.8 31.5 - 35.7 g/dL   RDW 04.5 40.9 - 81.1 %   Platelets 282 150 - 379 x10E3/uL   Neutrophils 58 Not Estab. %   Lymphs 31 Not Estab. %   Monocytes 8 Not Estab. %   Eos 3 Not Estab. %   Basos 0 Not Estab. %   Neutrophils Absolute 4.2 1.4 - 7.0 x10E3/uL   Lymphocytes Absolute 2.3 0.7 - 3.1 x10E3/uL   Monocytes Absolute 0.6 0.1 - 0.9 x10E3/uL   EOS (ABSOLUTE) 0.2 0.0 - 0.4 x10E3/uL    Basophils Absolute 0.0 0.0 - 0.2 x10E3/uL   Immature Granulocytes 0 Not Estab. %   Immature Grans (Abs) 0.0 0.0 - 0.1 x10E3/uL  Comprehensive metabolic panel  Result Value Ref Range   Glucose 179 (H) 65 - 99 mg/dL   BUN 14 6 - 20 mg/dL   Creatinine, Ser 9.14 0.76 - 1.27 mg/dL   GFR calc non Af Amer 109 >59 mL/min/1.73   GFR calc Af Amer 126 >59 mL/min/1.73   BUN/Creatinine Ratio 15 9 - 20   Sodium 140 134 - 144 mmol/L   Potassium 4.6 3.5 - 5.2 mmol/L   Chloride 101 96 - 106 mmol/L   CO2 21 20 - 29 mmol/L   Calcium 9.2 8.7 - 10.2 mg/dL   Total Protein 6.7 6.0 - 8.5 g/dL   Albumin 4.4 3.5 - 5.5 g/dL   Globulin, Total 2.3 1.5 - 4.5 g/dL   Albumin/Globulin Ratio 1.9 1.2 - 2.2   Bilirubin Total 0.3 0.0 - 1.2 mg/dL   Alkaline Phosphatase 107 39 - 117 IU/L   AST 17 0 - 40 IU/L   ALT <5 0 - 44 IU/L  Lipid Panel w/o Chol/HDL Ratio  Result Value Ref Range   Cholesterol, Total 132 100 - 199 mg/dL   Triglycerides 782 (H) 0 - 149 mg/dL   HDL 41 >95 mg/dL   VLDL Cholesterol Cal 34 5 - 40 mg/dL   LDL Calculated 57 0 - 99 mg/dL  Microalbumin, Urine Waived  Result Value Ref Range   Microalb, Ur Waived 80 (H) 0 - 19 mg/L   Creatinine, Urine Waived 300 10 - 300 mg/dL   Microalb/Creat Ratio 30-300 (H) <30 mg/g  TSH  Result Value Ref Range   TSH 0.864 0.450 - 4.500 uIU/mL  UA/M w/rflx Culture, Routine  Result Value Ref Range   Specific Gravity, UA >1.030 (H) 1.005 - 1.030   pH, UA 5.5 5.0 - 7.5   Color, UA Orange Yellow   Appearance Ur Clear Clear   Leukocytes, UA Negative Negative   Protein, UA Negative Negative/Trace   Glucose, UA Negative Negative   Ketones, UA Trace (A) Negative   RBC, UA Trace (A) Negative   Bilirubin, UA Negative Negative   Urobilinogen, Ur 0.2 0.2 - 1.0 mg/dL   Nitrite, UA Negative Negative   Microscopic Examination See below:   Hepatitis panel, acute  Result Value Ref Range   Hep A IgM Negative Negative   Hepatitis B Surface Ag Negative Negative   Hep B C  IgM Negative Negative   Hep C Virus Ab <0.1 0.0 - 0.9 s/co ratio  HSV(herpes simplex vrs) 1+2 ab-IgG  Result Value Ref Range   HSV 1 Glycoprotein G Ab,  IgG 21.20 (H) 0.00 - 0.90 index   HSV 2 IgG, Type Spec <0.91 0.00 - 0.90 index  RPR  Result Value Ref Range   RPR Ser Ql Non Reactive Non Reactive  HIV antibody  Result Value Ref Range   HIV Screen 4th Generation wRfx Non Reactive Non Reactive      Assessment & Plan:   Problem List Items Addressed This Visit    None    Visit Diagnoses    Bleeding external hemorrhoids    -  Primary   Will check CBC and start suppositories. To see general surgery ASAP.   Relevant Orders   Ambulatory referral to General Surgery   CBC with Differential/Platelet       Follow up plan: Return if symptoms worsen or fail to improve.

## 2017-10-21 NOTE — Patient Instructions (Addendum)
Hemorrhoids Hemorrhoids are swollen veins in and around the rectum or anus. There are two types of hemorrhoids:  Internal hemorrhoids. These occur in the veins that are just inside the rectum. They may poke through to the outside and become irritated and painful.  External hemorrhoids. These occur in the veins that are outside of the anus and can be felt as a painful swelling or hard lump near the anus.  Most hemorrhoids do not cause serious problems, and they can be managed with home treatments such as diet and lifestyle changes. If home treatments do not help your symptoms, procedures can be done to shrink or remove the hemorrhoids. What are the causes? This condition is caused by increased pressure in the anal area. This pressure may result from various things, including:  Constipation.  Straining to have a bowel movement.  Diarrhea.  Pregnancy.  Obesity.  Sitting for long periods of time.  Heavy lifting or other activity that causes you to strain.  Anal sex.  What are the signs or symptoms? Symptoms of this condition include:  Pain.  Anal itching or irritation.  Rectal bleeding.  Leakage of stool (feces).  Anal swelling.  One or more lumps around the anus.  How is this diagnosed? This condition can often be diagnosed through a visual exam. Other exams or tests may also be done, such as:  Examination of the rectal area with a gloved hand (digital rectal exam).  Examination of the anal canal using a small tube (anoscope).  A blood test, if you have lost a significant amount of blood.  A test to look inside the colon (sigmoidoscopy or colonoscopy).  How is this treated? This condition can usually be treated at home. However, various procedures may be done if dietary changes, lifestyle changes, and other home treatments do not help your symptoms. These procedures can help make the hemorrhoids smaller or remove them completely. Some of these procedures involve  surgery, and others do not. Common procedures include:  Rubber band ligation. Rubber bands are placed at the base of the hemorrhoids to cut off the blood supply to them.  Sclerotherapy. Medicine is injected into the hemorrhoids to shrink them.  Infrared coagulation. A type of light energy is used to get rid of the hemorrhoids.  Hemorrhoidectomy surgery. The hemorrhoids are surgically removed, and the veins that supply them are tied off.  Stapled hemorrhoidopexy surgery. A circular stapling device is used to remove the hemorrhoids and use staples to cut off the blood supply to them.  Follow these instructions at home: Eating and drinking  Eat foods that have a lot of fiber in them, such as whole grains, beans, nuts, fruits, and vegetables. Ask your health care provider about taking products that have added fiber (fiber supplements).  Drink enough fluid to keep your urine clear or pale yellow. Managing pain and swelling  Take warm sitz baths for 20 minutes, 3-4 times a day to ease pain and discomfort.  If directed, apply ice to the affected area. Using ice packs between sitz baths may be helpful. ? Put ice in a plastic bag. ? Place a towel between your skin and the bag. ? Leave the ice on for 20 minutes, 2-3 times a day. General instructions  Take over-the-counter and prescription medicines only as told by your health care provider.  Use medicated creams or suppositories as told.  Exercise regularly.  Go to the bathroom when you have the urge to have a bowel movement. Do not wait.    Avoid straining to have bowel movements.  Keep the anal area dry and clean. Use wet toilet paper or moist towelettes after a bowel movement.  Do not sit on the toilet for long periods of time. This increases blood pooling and pain. Contact a health care provider if:  You have increasing pain and swelling that are not controlled by treatment or medicine.  You have uncontrolled bleeding.  You  have difficulty having a bowel movement, or you are unable to have a bowel movement.  You have pain or inflammation outside the area of the hemorrhoids. This information is not intended to replace advice given to you by your health care provider. Make sure you discuss any questions you have with your health care provider. Document Released: 07/27/2000 Document Revised: 12/28/2015 Document Reviewed: 04/13/2015 Elsevier Interactive Patient Education  2018 Elsevier Inc.  How to Take a Sitz Bath A sitz bath is a warm water bath that is taken while you are sitting down. The water should only come up to your hips and should cover your buttocks. Your health care provider may recommend a sitz bath to help you:  Clean the lower part of your body, including your genital area.  With itching.  With pain.  With sore muscles or muscles that tighten or spasm.  How to take a sitz bath Take 3-4 sitz baths per day or as told by your health care provider. 1. Partially fill a bathtub with warm water. You will only need the water to be deep enough to cover your hips and buttocks when you are sitting in it. 2. If your health care provider told you to put medicine in the water, follow the directions exactly. 3. Sit in the water and open the tub drain a little. 4. Turn on the warm water again to keep the tub at the correct level. Keep the water running constantly. 5. Soak in the water for 15-20 minutes or as told by your health care provider. 6. After the sitz bath, pat the affected area dry first. Do not rub it. 7. Be careful when you stand up after the sitz bath because you may feel dizzy.  Contact a health care provider if:  Your symptoms get worse. Do not continue with sitz baths if your symptoms get worse.  You have new symptoms. Do not continue with sitz baths until you talk with your health care provider. This information is not intended to replace advice given to you by your health care provider.  Make sure you discuss any questions you have with your health care provider. Document Released: 04/21/2004 Document Revised: 12/28/2015 Document Reviewed: 07/28/2014 Elsevier Interactive Patient Education  2018 Elsevier Inc.  

## 2017-10-22 ENCOUNTER — Encounter: Payer: Self-pay | Admitting: Family Medicine

## 2017-10-22 LAB — CBC WITH DIFFERENTIAL/PLATELET
BASOS: 0 %
Basophils Absolute: 0 10*3/uL (ref 0.0–0.2)
EOS (ABSOLUTE): 0.1 10*3/uL (ref 0.0–0.4)
EOS: 1 %
HEMATOCRIT: 43.3 % (ref 37.5–51.0)
HEMOGLOBIN: 15 g/dL (ref 13.0–17.7)
IMMATURE GRANS (ABS): 0 10*3/uL (ref 0.0–0.1)
Immature Granulocytes: 0 %
LYMPHS ABS: 3.2 10*3/uL — AB (ref 0.7–3.1)
LYMPHS: 32 %
MCH: 28.7 pg (ref 26.6–33.0)
MCHC: 34.6 g/dL (ref 31.5–35.7)
MCV: 83 fL (ref 79–97)
MONOCYTES: 9 %
Monocytes Absolute: 0.9 10*3/uL (ref 0.1–0.9)
Neutrophils Absolute: 5.6 10*3/uL (ref 1.4–7.0)
Neutrophils: 58 %
Platelets: 280 10*3/uL (ref 150–379)
RBC: 5.23 x10E6/uL (ref 4.14–5.80)
RDW: 14.3 % (ref 12.3–15.4)
WBC: 9.8 10*3/uL (ref 3.4–10.8)

## 2017-10-24 ENCOUNTER — Telehealth: Payer: Self-pay | Admitting: General Practice

## 2017-10-24 ENCOUNTER — Ambulatory Visit: Payer: 59 | Admitting: Surgery

## 2017-10-24 NOTE — Telephone Encounter (Signed)
Left a message for the patient to call the office, patient no showed appointment on 10/24/17 with Dr. Excell Seltzerooper, please reschedule if patient calls back.

## 2017-10-29 NOTE — Telephone Encounter (Signed)
Spoke with patient asking if he would like to r/s his no showed appointment, patient stated he was doing good and did not want to r/s this appointment.

## 2018-02-10 ENCOUNTER — Ambulatory Visit (INDEPENDENT_AMBULATORY_CARE_PROVIDER_SITE_OTHER): Payer: 59 | Admitting: Family Medicine

## 2018-02-10 ENCOUNTER — Encounter: Payer: Self-pay | Admitting: Family Medicine

## 2018-02-10 VITALS — BP 121/78 | HR 99 | Temp 98.4°F | Ht 67.0 in | Wt 197.8 lb

## 2018-02-10 DIAGNOSIS — R05 Cough: Secondary | ICD-10-CM

## 2018-02-10 DIAGNOSIS — F3181 Bipolar II disorder: Secondary | ICD-10-CM

## 2018-02-10 DIAGNOSIS — R059 Cough, unspecified: Secondary | ICD-10-CM

## 2018-02-10 MED ORDER — ALBUTEROL SULFATE HFA 108 (90 BASE) MCG/ACT IN AERS: 2.0000 | INHALATION_SPRAY | Freq: Four times a day (QID) | RESPIRATORY_TRACT | 2 refills | Status: DC | PRN

## 2018-02-10 MED ORDER — QUETIAPINE FUMARATE 25 MG PO TABS: 25.0000 mg | ORAL_TABLET | Freq: Every day | ORAL | 2 refills | Status: DC

## 2018-02-10 NOTE — Assessment & Plan Note (Signed)
Will treat with seroquel. He is very anxious about starting medicine. Will start low dose seroquel and recheck in 2-3 weeks.

## 2018-02-10 NOTE — Progress Notes (Signed)
BP 121/78   Pulse 99   Temp 98.4 F (36.9 C) (Oral)   Ht 5\' 7"  (1.702 m)   Wt 197 lb 12.8 oz (89.7 kg)   BMI 30.98 kg/m    Subjective:    Patient ID: Alexis [name], male    DOB: [birth year], 32 y.o.   MRN: [id number]  HPI: Alexis [name] is a 32 y.o. male  Chief Complaint  Patient presents with  . Mood  . Cough    dry cough   MOOD ISSUE- doesn't feel like he can control things. Thinks about sex all the time. Feels like it's constantly on his mind. He has intrusive thoughts. +MDQ. Sleep not good. He goes to sleep about 2AM and gets up about 6AM. Feels like he doesn't need sleep. Has been gambling more than he should. Has been talking to girls with issues with his fiance.  Mood status: exacerbated Satisfied with current treatment?: no Symptom severity: moderate  Duration of current treatment : Not on anything Psychotherapy/counseling: no  Previous psychiatric medications: none Depressed mood: yes Anxious mood: yes Anhedonia: no Significant weight loss or gain: no Insomnia: yes hard to fall asleep Fatigue: yes Feelings of worthlessness or guilt: yes Impaired concentration/indecisiveness: yes Suicidal ideations: no Hopelessness: no Crying spells: no Depression screen [facility] 02/10/2018 08/30/2017 07/27/2016  Decreased Interest 0 0 0  Down, Depressed, Hopeless 0 0 0  PHQ - 2 Score 0 0 0  Altered sleeping 3 0 -  Tired, decreased energy 3 0 -  Change in appetite 3 0 -  Feeling bad or failure about yourself  3 0 -  Trouble concentrating 1 0 -  Moving slowly or fidgety/restless 0 0 -  Suicidal thoughts 0 0 -  PHQ-9 Score 13 0 -  Difficult doing work/chores Very difficult - -   GAD 7 : Generalized Anxiety Score 02/10/2018  Nervous, Anxious, on Edge 2  Control/stop worrying 2  Worry too much - different things 2  Trouble relaxing 2  Restless 2  Easily annoyed or irritable 2  Afraid - awful might happen 0  Total GAD 7 Score 12  Anxiety Difficulty Very  difficult   MDQ: Negative  Relevant past medical, surgical, family and social history reviewed and updated as indicated. Interim medical history since our last visit reviewed. Allergies and medications reviewed and updated.  Review of Systems  Constitutional: Negative.   Respiratory: Negative.   Cardiovascular: Negative.   Musculoskeletal: Negative.   Skin: Negative.   Psychiatric/Behavioral: Positive for agitation, behavioral problems, dysphoric mood and sleep disturbance. Negative for confusion, decreased concentration, hallucinations, self-injury and suicidal ideas. The patient is nervous/anxious. The patient is not hyperactive.     Per HPI unless specifically indicated above     Objective:    BP 121/78   Pulse 99   Temp 98.4 F (36.9 C) (Oral)   Ht 5\' 7"  (1.702 m)   Wt 197 lb 12.8 oz (89.7 kg)   BMI 30.98 kg/m   Wt Readings from Last 3 Encounters:  02/10/18 197 lb 12.8 oz (89.7 kg)  10/21/17 192 lb (87.1 kg)  10/21/17 192 lb 8 oz (87.3 kg)    Physical Exam  Constitutional: He is oriented to person, place, and time. He appears well-developed and well-nourished. No distress.  HENT:  Head: Normocephalic and atraumatic.  Right Ear: Hearing normal.  Left Ear: Hearing normal.  Nose: Nose normal.  Eyes: Conjunctivae and lids are normal. Right eye exhibits no discharge.  Left eye exhibits no discharge. No scleral icterus.  Cardiovascular: Normal rate, regular rhythm, normal heart sounds and intact distal pulses. Exam reveals no gallop and no friction rub.  No murmur heard. Pulmonary/Chest: Effort normal and breath sounds normal. No stridor. No respiratory distress. He has no wheezes. He has no rales. He exhibits no tenderness.  Musculoskeletal: Normal range of motion.  Neurological: He is alert and oriented to person, place, and time.  Skin: Skin is warm, dry and intact. Capillary refill takes less than 2 seconds. No rash noted. He is not diaphoretic. No erythema. No pallor.   Psychiatric: He has a normal mood and affect. His speech is normal and behavior is normal. Judgment and thought content normal. Cognition and memory are normal.    Results for orders placed or performed in visit on 10/21/17  CBC with Differential/Platelet  Result Value Ref Range   WBC 9.8 3.4 - 10.8 x10E3/uL   RBC 5.23 4.14 - 5.80 x10E6/uL   Hemoglobin 15.0 13.0 - 17.7 g/dL   Hematocrit 16.1 09.6 - 51.0 %   MCV 83 79 - 97 fL   MCH 28.7 26.6 - 33.0 pg   MCHC 34.6 31.5 - 35.7 g/dL   RDW 04.5 40.9 - 81.1 %   Platelets 280 150 - 379 x10E3/uL   Neutrophils 58 Not Estab. %   Lymphs 32 Not Estab. %   Monocytes 9 Not Estab. %   Eos 1 Not Estab. %   Basos 0 Not Estab. %   Neutrophils Absolute 5.6 1.4 - 7.0 x10E3/uL   Lymphocytes Absolute 3.2 (H) 0.7 - 3.1 x10E3/uL   Monocytes Absolute 0.9 0.1 - 0.9 x10E3/uL   EOS (ABSOLUTE) 0.1 0.0 - 0.4 x10E3/uL   Basophils Absolute 0.0 0.0 - 0.2 x10E3/uL   Immature Granulocytes 0 Not Estab. %   Immature Grans (Abs) 0.0 0.0 - 0.1 x10E3/uL      Assessment & Plan:   Problem List Items Addressed This Visit      Other   Bipolar 2 disorder (HCC) - Primary    Will treat with seroquel. He is very anxious about starting medicine. Will start low dose seroquel and recheck in 2-3 weeks.        Other Visit Diagnoses    Cough       Will treat with albuterol. Call with any concerns.        Follow up plan: Return 2-3 weeks, for follow up mood.

## 2018-03-06 ENCOUNTER — Ambulatory Visit (INDEPENDENT_AMBULATORY_CARE_PROVIDER_SITE_OTHER): Payer: 59 | Admitting: Family Medicine

## 2018-03-06 ENCOUNTER — Encounter: Payer: Self-pay | Admitting: Family Medicine

## 2018-03-06 VITALS — BP 130/79 | HR 94 | Temp 98.5°F | Wt 201.4 lb

## 2018-03-06 DIAGNOSIS — E119 Type 2 diabetes mellitus without complications: Secondary | ICD-10-CM

## 2018-03-06 DIAGNOSIS — F3181 Bipolar II disorder: Secondary | ICD-10-CM

## 2018-03-06 NOTE — Assessment & Plan Note (Signed)
Rechecking levels today. Will adjust as needed. Call with any concerns.  

## 2018-03-06 NOTE — Patient Instructions (Signed)
Take 1.5 pills at bedtime for 1 week, then increase to 2 pills at bedtime after that.

## 2018-03-06 NOTE — Progress Notes (Signed)
BP 130/79   Pulse 94   Temp 98.5 F (36.9 C) (Oral)   Wt 201 lb 6.4 oz (91.4 kg)   SpO2 96%   BMI 31.54 kg/m    Subjective:    Patient ID: Alexis [name], male    DOB: [birth year], 32 y.o.   MRN: [id number]  HPI: Alexis [name] is a 32 y.o. male  Chief Complaint  Patient presents with  . Manic Behavior   DEPRESSION Mood status: better Satisfied with current treatment?: no Symptom severity: moderate  Duration of current treatment : 3 weeks Side effects: no Medication compliance: good compliance Psychotherapy/counseling: no  Previous psychiatric medications: seroquel Depressed mood: yes Anxious mood: yes Anhedonia: no Significant weight loss or gain: no Insomnia: yes hard to fall asleep Fatigue: yes Feelings of worthlessness or guilt: yes Impaired concentration/indecisiveness: yes Suicidal ideations: no Hopelessness: no Crying spells: no Depression screen [facility] 03/06/2018 02/10/2018 08/30/2017 07/27/2016  Decreased Interest 1 0 0 0  Down, Depressed, Hopeless 0 0 0 0  PHQ - 2 Score 1 0 0 0  Altered sleeping 2 3 0 -  Tired, decreased energy 1 3 0 -  Change in appetite 2 3 0 -  Feeling bad or failure about yourself  0 3 0 -  Trouble concentrating 0 1 0 -  Moving slowly or fidgety/restless 0 0 0 -  Suicidal thoughts 0 0 0 -  PHQ-9 Score 6 13 0 -  Difficult doing work/chores Not difficult at all Very difficult - -   GAD 7 : Generalized Anxiety Score 03/06/2018 02/10/2018  Nervous, Anxious, on Edge 0 2  Control/stop worrying 0 2  Worry too much - different things 0 2  Trouble relaxing 1 2  Restless 0 2  Easily annoyed or irritable 1 2  Afraid - awful might happen 0 0  Total GAD 7 Score 2 12  Anxiety Difficulty Not difficult at all Very difficult   Relevant past medical, surgical, family and social history reviewed and updated as indicated. Interim medical history since our last visit reviewed. Allergies and medications reviewed and  updated.  Review of Systems  Constitutional: Negative.   Respiratory: Negative.   Cardiovascular: Negative.   Neurological: Negative.   Psychiatric/Behavioral: Negative.     Per HPI unless specifically indicated above     Objective:    BP 130/79   Pulse 94   Temp 98.5 F (36.9 C) (Oral)   Wt 201 lb 6.4 oz (91.4 kg)   SpO2 96%   BMI 31.54 kg/m   Wt Readings from Last 3 Encounters:  03/06/18 201 lb 6.4 oz (91.4 kg)  02/10/18 197 lb 12.8 oz (89.7 kg)  10/21/17 192 lb (87.1 kg)    Physical Exam  Constitutional: He is oriented to person, place, and time. He appears well-developed and well-nourished. No distress.  HENT:  Head: Normocephalic and atraumatic.  Right Ear: Hearing normal.  Left Ear: Hearing normal.  Nose: Nose normal.  Eyes: Conjunctivae and lids are normal. Right eye exhibits no discharge. Left eye exhibits no discharge. No scleral icterus.  Cardiovascular: Normal rate, regular rhythm, normal heart sounds and intact distal pulses. Exam reveals no gallop and no friction rub.  No murmur heard. Pulmonary/Chest: Effort normal and breath sounds normal. No stridor. No respiratory distress. He has no wheezes. He has no rales. He exhibits no tenderness.  Musculoskeletal: Normal range of motion.  Neurological: He is alert and oriented to person, place, and time.  Skin:  Skin is warm, dry and intact. Capillary refill takes less than 2 seconds. No rash noted. He is not diaphoretic. No erythema. No pallor.  Psychiatric: He has a normal mood and affect. His speech is normal and behavior is normal. Judgment and thought content normal. Cognition and memory are normal.  Nursing note and vitals reviewed.   Results for orders placed or performed in visit on 10/21/17  CBC with Differential/Platelet  Result Value Ref Range   WBC 9.8 3.4 - 10.8 x10E3/uL   RBC 5.23 4.14 - 5.80 x10E6/uL   Hemoglobin 15.0 13.0 - 17.7 g/dL   Hematocrit 16.1 09.6 - 51.0 %   MCV 83 79 - 97 fL   MCH  28.7 26.6 - 33.0 pg   MCHC 34.6 31.5 - 35.7 g/dL   RDW 04.5 40.9 - 81.1 %   Platelets 280 150 - 379 x10E3/uL   Neutrophils 58 Not Estab. %   Lymphs 32 Not Estab. %   Monocytes 9 Not Estab. %   Eos 1 Not Estab. %   Basos 0 Not Estab. %   Neutrophils Absolute 5.6 1.4 - 7.0 x10E3/uL   Lymphocytes Absolute 3.2 (H) 0.7 - 3.1 x10E3/uL   Monocytes Absolute 0.9 0.1 - 0.9 x10E3/uL   EOS (ABSOLUTE) 0.1 0.0 - 0.4 x10E3/uL   Basophils Absolute 0.0 0.0 - 0.2 x10E3/uL   Immature Granulocytes 0 Not Estab. %   Immature Grans (Abs) 0.0 0.0 - 0.1 x10E3/uL      Assessment & Plan:   Problem List Items Addressed This Visit      Endocrine   Controlled type 2 diabetes mellitus (HCC)    Rechecking levels today. Will adjust as needed. Call with any concerns.       Relevant Orders   Hgb A1c w/o eAG   Comprehensive metabolic panel   Lipid Panel w/o Chol/HDL Ratio     Other   Bipolar 2 disorder (HCC) - Primary    Doing better! Not quite there though. Will increase to 50mg  over a couple of weeks and recheck in 1 month. Call with any concerns.           Follow up plan: Return in about 1 month (around 04/03/2018) for Follow up.

## 2018-03-06 NOTE — Assessment & Plan Note (Signed)
Doing better! Not quite there though. Will increase to 50mg  over a couple of weeks and recheck in 1 month. Call with any concerns.

## 2018-03-07 LAB — COMPREHENSIVE METABOLIC PANEL
AST: 23 IU/L (ref 0–40)
Albumin/Globulin Ratio: 2 (ref 1.2–2.2)
Albumin: 4.7 g/dL (ref 3.5–5.5)
Alkaline Phosphatase: 113 IU/L (ref 39–117)
BUN/Creatinine Ratio: 18 (ref 9–20)
BUN: 16 mg/dL (ref 6–20)
Bilirubin Total: <0.2 mg/dL (ref 0.0–1.2)
CALCIUM: 9.3 mg/dL (ref 8.7–10.2)
CO2: 24 mmol/L (ref 20–29)
CREATININE: 0.9 mg/dL (ref 0.76–1.27)
Chloride: 101 mmol/L (ref 96–106)
GFR, EST AFRICAN AMERICAN: 128 mL/min/{1.73_m2} (ref >59)
GFR, EST NON AFRICAN AMERICAN: 111 mL/min/{1.73_m2} (ref >59)
GLUCOSE: 174 mg/dL — AB (ref 65–99)
Globulin, Total: 2.4 g/dL (ref 1.5–4.5)
Potassium: 4.5 mmol/L (ref 3.5–5.2)
Sodium: 138 mmol/L (ref 134–144)
TOTAL PROTEIN: 7.1 g/dL (ref 6.0–8.5)

## 2018-03-07 LAB — LIPID PANEL W/O CHOL/HDL RATIO
Cholesterol, Total: 204 mg/dL — ABNORMAL HIGH (ref 100–199)
HDL: 33 mg/dL — AB (ref >39)
TRIGLYCERIDES: 502 mg/dL — AB (ref 0–149)

## 2018-03-07 LAB — HGB A1C W/O EAG: Hgb A1c MFr Bld: 7.2 % — ABNORMAL HIGH (ref 4.8–5.6)

## 2018-03-10 ENCOUNTER — Other Ambulatory Visit: Payer: Self-pay | Admitting: Family Medicine

## 2018-03-10 ENCOUNTER — Encounter: Payer: Self-pay | Admitting: Family Medicine

## 2018-03-10 MED ORDER — METFORMIN HCL 1000 MG PO TABS: 1000.0000 mg | ORAL_TABLET | Freq: Two times a day (BID) | ORAL | 1 refills | Status: DC

## 2018-05-02 ENCOUNTER — Other Ambulatory Visit: Payer: Self-pay

## 2018-05-02 ENCOUNTER — Emergency Department: Payer: 59

## 2018-05-02 ENCOUNTER — Encounter: Payer: Self-pay | Admitting: Family Medicine

## 2018-05-02 ENCOUNTER — Emergency Department
Admission: EM | Admit: 2018-05-02 | Discharge: 2018-05-02 | Disposition: A | Discharge status: Home / Self Care | Payer: 59 | Attending: Emergency Medicine | Admitting: Emergency Medicine

## 2018-05-02 ENCOUNTER — Ambulatory Visit (INDEPENDENT_AMBULATORY_CARE_PROVIDER_SITE_OTHER): Payer: 59 | Admitting: Family Medicine

## 2018-05-02 VITALS — BP 131/81 | HR 93 | Temp 98.4°F

## 2018-05-02 DIAGNOSIS — S61022A Laceration with foreign body of left thumb without damage to nail, initial encounter: Secondary | ICD-10-CM | POA: Diagnosis not present

## 2018-05-02 DIAGNOSIS — E119 Type 2 diabetes mellitus without complications: Secondary | ICD-10-CM | POA: Diagnosis not present

## 2018-05-02 DIAGNOSIS — S61011A Laceration without foreign body of right thumb without damage to nail, initial encounter: Secondary | ICD-10-CM

## 2018-05-02 DIAGNOSIS — F17210 Nicotine dependence, cigarettes, uncomplicated: Secondary | ICD-10-CM | POA: Insufficient documentation

## 2018-05-02 DIAGNOSIS — Y929 Unspecified place or not applicable: Secondary | ICD-10-CM | POA: Insufficient documentation

## 2018-05-02 DIAGNOSIS — Z7984 Long term (current) use of oral hypoglycemic drugs: Secondary | ICD-10-CM | POA: Insufficient documentation

## 2018-05-02 DIAGNOSIS — Y999 Unspecified external cause status: Secondary | ICD-10-CM | POA: Diagnosis not present

## 2018-05-02 DIAGNOSIS — S61012A Laceration without foreign body of left thumb without damage to nail, initial encounter: Secondary | ICD-10-CM | POA: Diagnosis not present

## 2018-05-02 DIAGNOSIS — Y939 Activity, unspecified: Secondary | ICD-10-CM | POA: Insufficient documentation

## 2018-05-02 DIAGNOSIS — S66921A Laceration of unspecified muscle, fascia and tendon at wrist and hand level, right hand, initial encounter: Secondary | ICD-10-CM

## 2018-05-02 DIAGNOSIS — W298XXA Contact with other powered powered hand tools and household machinery, initial encounter: Secondary | ICD-10-CM | POA: Diagnosis not present

## 2018-05-02 MED ORDER — LIDOCAINE HCL (PF) 1 % IJ SOLN
INTRAMUSCULAR | Status: AC
  Filled 2018-05-02: qty 5

## 2018-05-02 MED ORDER — CEPHALEXIN 500 MG PO CAPS: 500.0000 mg | ORAL_CAPSULE | Freq: Three times a day (TID) | ORAL | 0 refills | Status: AC

## 2018-05-02 MED ORDER — LIDOCAINE HCL 1 % IJ SOLN
5.0000 mL | Freq: Once | INTRAMUSCULAR | Status: DC
  Filled 2018-05-02: qty 5

## 2018-05-02 NOTE — ED Triage Notes (Signed)
Left hand thumb laceration with dremmel tool. Bleeding has stopped, pt able to move finger without difficulty. Pt alert and oriented X4, active, cooperative, pt in NAD. RR even and unlabored, color WNL.

## 2018-05-02 NOTE — Progress Notes (Signed)
BP 131/81   Pulse 93   Temp 98.4 F (36.9 C) (Oral)   SpO2 98%    Subjective:    Patient ID: Alexis [name], male    DOB: Jan 17, 1986, 32 y.o.   MRN: [id number]  HPI: Alexis MuttonKenneth L Porcelli [name] is a 32 y.o. male  Chief Complaint  Patient presents with  . Laceration   LACERATION Mechanism of injury: sliced it on a drimmel Time since injury: about an hour Pain: yes Severity: moderate Quality: sharp Bleeding: yes Redness: yes Oozing: no Pus: no Fevers: no Nausea/vomiting: no Status: stable Treatments attempted: none Tetanus: Up to Date   Relevant past medical, surgical, family and social history reviewed and updated as indicated. Interim medical history since our last visit reviewed. Allergies and medications reviewed and updated.  Review of Systems  Constitutional: Negative.   Respiratory: Negative.   Cardiovascular: Negative.   Skin: Positive for wound. Negative for color change, pallor and rash.  Psychiatric/Behavioral: Negative.     Per HPI unless specifically indicated above     Objective:    BP 131/81   Pulse 93   Temp 98.4 F (36.9 C) (Oral)   SpO2 98%   Wt Readings from Last 3 Encounters:  03/06/18 201 lb 6.4 oz (91.4 kg)  02/10/18 197 lb 12.8 oz (89.7 kg)  10/21/17 192 lb (87.1 kg)    Physical Exam  Constitutional: He is oriented to person, place, and time. He appears well-developed and well-nourished. No distress.  HENT:  Head: Normocephalic and atraumatic.  Right Ear: Hearing normal.  Left Ear: Hearing normal.  Nose: Nose normal.  Eyes: Conjunctivae and lids are normal. Right eye exhibits no discharge. Left eye exhibits no discharge. No scleral icterus.  Pulmonary/Chest: Effort normal. No respiratory distress.  Musculoskeletal: Normal range of motion.  Neurological: He is alert and oriented to person, place, and time.  Skin: Skin is warm, dry and intact. Capillary refill takes less than 2 seconds. No rash noted. No erythema. No pallor.   1 inch laceration on R thumb- deep, about 1cm with what appears to be a tendon on either side of the wound  Psychiatric: He has a normal mood and affect. His speech is normal and behavior is normal. Judgment and thought content normal. Cognition and memory are normal.    Results for orders placed or performed in visit on 03/06/18  Hgb A1c w/o eAG  Result Value Ref Range   Hgb A1c MFr Bld 7.2 (H) 4.8 - 5.6 %  Comprehensive metabolic panel  Result Value Ref Range   Glucose 174 (H) 65 - 99 mg/dL   BUN 16 6 - 20 mg/dL   Creatinine, Ser 4.090.90 0.76 - 1.27 mg/dL   GFR calc non Af Amer 111 >59 mL/min/1.73   GFR calc Af Amer 128 >59 mL/min/1.73   BUN/Creatinine Ratio 18 9 - 20   Sodium 138 134 - 144 mmol/L   Potassium 4.5 3.5 - 5.2 mmol/L   Chloride 101 96 - 106 mmol/L   CO2 24 20 - 29 mmol/L   Calcium 9.3 8.7 - 10.2 mg/dL   Total Protein 7.1 6.0 - 8.5 g/dL   Albumin 4.7 3.5 - 5.5 g/dL   Globulin, Total 2.4 1.5 - 4.5 g/dL   Albumin/Globulin Ratio 2.0 1.2 - 2.2   Bilirubin Total <0.2 0.0 - 1.2 mg/dL   Alkaline Phosphatase 113 39 - 117 IU/L   AST 23 0 - 40 IU/L   ALT <5 0 - 44  IU/L  Lipid Panel w/o Chol/HDL Ratio  Result Value Ref Range   Cholesterol, Total 204 (H) 100 - 199 mg/dL   Triglycerides 161 (H) 0 - 149 mg/dL   HDL 33 (L) >09 mg/dL   VLDL Cholesterol Cal Comment 5 - 40 mg/dL   LDL Calculated Comment 0 - 99 mg/dL      Assessment & Plan:   Problem List Items Addressed This Visit    None    Visit Diagnoses    Laceration of right thumb with tendon involvement, initial encounter    -  Primary   Will get him into ER to see if he can see hand surgeon. To go there now. Tdap up to date.        Follow up plan: Return As scheduled.

## 2018-05-02 NOTE — ED Provider Notes (Signed)
[facility] Emergency Department Provider Note  ____________________________________________  Time seen: Approximately 6:41 PM  I have reviewed the triage vital signs and the nursing notes.   HISTORY  Chief Complaint Laceration    HPI Alexis [name] is a 32 y.o. male presents to the emergency department with a 2 cm laceration at the volar aspect of the left thumb sustained with a dremmel tool.  Patient was seen earlier today by urgent care and was referred to the emergency department with concern for possible flexor tendon injury.  Patient reports that he has been able to move the thumb in all directions and only has pain over the laceration site.  He denies numbness and tingling in the left hand.  His tetanus status is up-to-date.   Past Medical History:  Diagnosis Date  . Acute renal failure (HCC)   . Diabetes type 2, uncontrolled (HCC) 01/20/2016    Patient Active Problem List   Diagnosis Date Noted  . Bipolar 2 disorder (HCC) 02/10/2018  . Controlled type 2 diabetes mellitus (HCC) 01/20/2016    Past Surgical History:  Procedure Laterality Date  . TONSILLECTOMY    . TYMPANOSTOMY TUBE PLACEMENT      Prior to Admission medications   Medication Sig Start Date End Date Taking? Authorizing Provider  albuterol (PROVENTIL HFA;VENTOLIN HFA) 108 (90 Base) MCG/ACT inhaler Inhale 2 puffs into the lungs every 6 (six) hours as needed for wheezing or shortness of breath. 02/10/18   Johnson, Megan P, DO  cephALEXin (KEFLEX) 500 MG capsule Take 1 capsule (500 mg total) by mouth 3 (three) times daily for 10 days. 05/02/18 05/12/18  Orvil FeilWoods, Jayke Caul M, PA-C  metFORMIN (GLUCOPHAGE) 1000 MG tablet Take 1 tablet (1,000 mg total) by mouth 2 (two) times daily with a meal. 03/10/18   Johnson, Megan P, DO  QUEtiapine (SEROQUEL) 25 MG tablet Take 1 tablet (25 mg total) by mouth at bedtime. 02/10/18   Dorcas CarrowJohnson, Megan P, DO    Allergies Patient has no known allergies.  Family  History  Problem Relation Age of Onset  . Hyperlipidemia Mother   . Hypertension Mother   . Diabetes Father   . Diabetes Maternal Grandmother        Lung    Social History Social History   Tobacco Use  . Smoking status: Current Every Day Smoker    Packs/day: 0.50    Types: Cigarettes  . Smokeless tobacco: Never Used  Substance Use Topics  . Alcohol use: No  . Drug use: Yes    Types: Marijuana     Review of Systems  Constitutional: No fever/chills Eyes: No visual changes. No discharge ENT: No upper respiratory complaints. Cardiovascular: no chest pain. Respiratory: no cough. No SOB. Gastrointestinal: No abdominal pain.  No nausea, no vomiting.  No diarrhea.  No constipation. Genitourinary: Negative for dysuria. No hematuria Musculoskeletal: Negative for musculoskeletal pain. Skin: Patient has left hand laceration.  Neurological: Negative for headaches, focal weakness or numbness.   ____________________________________________   PHYSICAL EXAM:  VITAL SIGNS: ED Triage Vitals [05/02/18 1714]  Enc Vitals Group     BP (!) 151/86     Pulse Rate 88     Resp 14     Temp 98.2 F (36.8 C)     Temp Source Oral     SpO2 98 %     Weight 195 lb (88.5 kg)     Height 5\' 8"  (1.727 m)     Head Circumference  Peak Flow      Pain Score 0     Pain Loc      Pain Edu?      Excl. in GC?      Constitutional: Alert and oriented. Well appearing and in no acute distress. Eyes: Conjunctivae are normal. PERRL. EOMI. Head: Atraumatic. Cardiovascular: Normal rate, regular rhythm. Normal S1 and S2.  Good peripheral circulation. Respiratory: Normal respiratory effort without tachypnea or retractions. Lungs CTAB. Good air entry to the bases with no decreased or absent breath sounds. Gastrointestinal: Bowel sounds 4 quadrants. Soft and nontender to palpation. No guarding or rigidity. No palpable masses. No distention. No CVA tenderness. Musculoskeletal: Patient has 5 out of 5  strength in the upper extremities bilaterally.  Patient is able to perform full range of motion at the left thumb.  He is able to perform resisted flexion and extension of the left thumb.  Palpable radial pulse, left. Neurologic:  Normal speech and language. No gross focal neurologic deficits are appreciated.  Skin: Patient has 2 cm, linear and horizontal laceration base of left thumb with no flexor tendon exposure. Psychiatric: Mood and affect are normal. Speech and behavior are normal. Patient exhibits appropriate insight and judgement.   ____________________________________________   LABS (all labs ordered are listed, but only abnormal results are displayed)  Labs Reviewed - No data to display ____________________________________________  EKG   ____________________________________________  RADIOLOGY I personally viewed and evaluated these images as part of my medical decision making, as well as reviewing the written report by the radiologist.  Dg Finger Thumb Left  Result Date: 05/02/2018 CLINICAL DATA:  Laceration to the thumb. EXAM: LEFT THUMB 2+V COMPARISON:  None. FINDINGS: There is no evidence of fracture or dislocation. There is no evidence of arthropathy or other focal bone abnormality. Soft tissues are unremarkable IMPRESSION: Negative. Electronically Signed   By: Kennith Center M.D.   On: 05/02/2018 18:01    ____________________________________________    PROCEDURES  Procedure(s) performed:    Procedures  LACERATION REPAIR Performed by: Max Wilmot Authorized by: Orvil Feil Consent: Verbal consent obtained. Risks and benefits: risks, benefits and alternatives were discussed Consent given by: patient Patient identity confirmed: provided demographic data Prepped and Draped in normal sterile fashion Wound explored  Laceration Location: Volar left thumb  Laceration Length: 2 cm  No Foreign Bodies seen or palpated  Anesthesia: local infiltration  Local  anesthetic: lidocaine 1% without epinephrine  Anesthetic total: 3 ml  Irrigation method: syringe Amount of cleaning: standard  Skin closure: 4-0 Ethilon   Number of sutures: 6  Technique: Simple Interrupted  Patient tolerance: Patient tolerated the procedure well with no immediate complications.   Medications  lidocaine (XYLOCAINE) 1 % (with pres) injection 5 mL (has no administration in time range)     ____________________________________________   INITIAL IMPRESSION / ASSESSMENT AND PLAN / ED COURSE  Pertinent labs & imaging results that were available during my care of the patient were reviewed by me and considered in my medical decision making (see chart for details).  Review of the [place] CSRS was performed in accordance of the [facility] prior to dispensing any controlled drugs.      Assessment and Plan:  Laceration Patient presents to the emergency department with a laceration of the left thumb.  In the emergency department without complication.  No acute fractures were visualized on x-ray examination of the left thumb.  Patient was advised to have sutures removed by primary care in 1 week.  He was discharged with Keflex and a splint was applied prior to patient being discharged.  He was advised to follow-up with Dr. Stephenie Acres as needed.  All patient questions were answered.    ____________________________________________  FINAL CLINICAL IMPRESSION(S) / ED DIAGNOSES  Final diagnoses:  Laceration of left thumb without foreign body without damage to nail, initial encounter      NEW MEDICATIONS STARTED DURING THIS VISIT:  ED Discharge Orders         Ordered    cephALEXin (KEFLEX) 500 MG capsule  3 times daily     05/02/18 1902              This chart was dictated using voice recognition software/Dragon. Despite best efforts to proofread, errors can occur which can change the meaning. Any change was purely unintentional.    Orvil Feil, PA-C 05/02/18  1958    Arnaldo Natal, MD 05/02/18 [phone]

## 2018-05-02 NOTE — ED Triage Notes (Signed)
First Nurse Note:  Laceration to left hand/ thumb.  Seen initially by PCP who referred patient to ED to evaluate for possible tendon injury.  Injury occurred at around 1530 today and cut hand with a dremmel  tool.

## 2018-05-02 NOTE — ED Notes (Signed)
See triage note  States he was using a Dremel   It slipped and cut his left thumb

## 2018-05-12 ENCOUNTER — Other Ambulatory Visit: Payer: Self-pay

## 2018-05-12 MED ORDER — QUETIAPINE FUMARATE 25 MG PO TABS: 25.0000 mg | ORAL_TABLET | Freq: Every day | ORAL | 1 refills | Status: DC

## 2018-05-12 NOTE — Telephone Encounter (Signed)
Patient requesting 90 day supply.

## 2019-03-20 ENCOUNTER — Other Ambulatory Visit: Payer: Self-pay

## 2019-03-20 ENCOUNTER — Ambulatory Visit (INDEPENDENT_AMBULATORY_CARE_PROVIDER_SITE_OTHER): Payer: 59 | Admitting: Family Medicine

## 2019-03-20 ENCOUNTER — Encounter: Payer: Self-pay | Admitting: Family Medicine

## 2019-03-20 VITALS — BP 121/81 | HR 84 | Temp 98.5°F | Ht 67.0 in | Wt 182.0 lb

## 2019-03-20 DIAGNOSIS — F3181 Bipolar II disorder: Secondary | ICD-10-CM | POA: Diagnosis not present

## 2019-03-20 DIAGNOSIS — E1165 Type 2 diabetes mellitus with hyperglycemia: Secondary | ICD-10-CM | POA: Diagnosis not present

## 2019-03-20 LAB — BAYER DCA HB A1C WAIVED: HB A1C (BAYER DCA - WAIVED): 13.4 % — ABNORMAL HIGH (ref <7.0)

## 2019-03-20 MED ORDER — METFORMIN HCL 1000 MG PO TABS: 1000.0000 mg | ORAL_TABLET | Freq: Two times a day (BID) | ORAL | 1 refills | Status: DC

## 2019-03-20 MED ORDER — QUETIAPINE FUMARATE 25 MG PO TABS: 25.0000 mg | ORAL_TABLET | Freq: Every day | ORAL | 1 refills | Status: DC

## 2019-03-20 NOTE — Assessment & Plan Note (Signed)
Not under good control with A1c of 13.4- has only been taking his metformin 1x a day- will increase back to 1000mg  BID and recheck 1 month. Call with any concerns.

## 2019-03-20 NOTE — Assessment & Plan Note (Signed)
Doing well on meds, but fatigued. Taking it 5 hours before he has to get up. Make sure to take meds 8 hours before he has to get up. Recheck 1 month.

## 2019-03-20 NOTE — Progress Notes (Signed)
BP 121/81   Pulse 84   Temp 98.5 F (36.9 C) (Oral)   Ht 5\' 7"  (1.702 m)   Wt 182 lb (82.6 kg)   SpO2 95%   BMI 28.51 kg/m    Subjective:    Patient ID: Alexis MuttonKenneth L Archambeault [name], male    DOB: [birth year], 33 y.o.   MRN: [id number]  HPI: Alexis [name] is a 33 y.o. male  Chief Complaint  Patient presents with  . Diabetes  . Manic Behavior    seroquel refill. patient states he has been out of it for about a week   DIABETES Hypoglycemic episodes:no Polydipsia/polyuria: no Visual disturbance: no Chest pain: no Paresthesias: no Glucose Monitoring: no  Accucheck frequency: Not Checking Taking Insulin?: no Blood Pressure Monitoring: not checking Retinal Examination: Not up to Date Foot Exam: Done today Diabetic Education: Not Completed Pneumovax: Up to Date Influenza: Get in the fall Aspirin: no  BIPOLAR- feels like he's dragging with the seroquel Mood status: uncontrolled Satisfied with current treatment?: no Symptom severity: moderate  Duration of current treatment : months Side effects: yes- fatigue Medication compliance: good compliance Psychotherapy/counseling: no  Previous psychiatric medications: seroquel Depressed mood: no Anxious mood: no Anhedonia: no Significant weight loss or gain: yes Insomnia: yes  Fatigue: yes Feelings of worthlessness or guilt: no Impaired concentration/indecisiveness: no Suicidal ideations: no Hopelessness: no Crying spells: no Depression screen [facility] 2/9 03/20/2019 03/06/2018 02/10/2018 08/30/2017 07/27/2016  Decreased Interest 1 1 0 0 0  Down, Depressed, Hopeless 0 0 0 0 0  PHQ - 2 Score 1 1 0 0 0  Altered sleeping 0 2 3 0 -  Tired, decreased energy 1 1 3  0 -  Change in appetite 1 2 3  0 -  Feeling bad or failure about yourself  0 0 3 0 -  Trouble concentrating 0 0 1 0 -  Moving slowly or fidgety/restless 0 0 0 0 -  Suicidal thoughts 0 0 0 0 -  PHQ-9 Score 3 6 13  0 -  Difficult doing work/chores Somewhat difficult Not  difficult at all Very difficult - -   GAD 7 : Generalized Anxiety Score 03/20/2019 03/06/2018 02/10/2018  Nervous, Anxious, on Edge 0 0 2  Control/stop worrying 0 0 2  Worry too much - different things 1 0 2  Trouble relaxing 1 1 2   Restless 0 0 2  Easily annoyed or irritable 3 1 2   Afraid - awful might happen 0 0 0  Total GAD 7 Score 5 2 12   Anxiety Difficulty Somewhat difficult Not difficult at all Very difficult    Relevant past medical, surgical, family and social history reviewed and updated as indicated. Interim medical history since our last visit reviewed. Allergies and medications reviewed and updated.  Review of Systems  Constitutional: Negative.   Respiratory: Negative.   Cardiovascular: Negative.   Neurological: Negative.   Psychiatric/Behavioral: Negative.     Per HPI unless specifically indicated above     Objective:    BP 121/81   Pulse 84   Temp 98.5 F (36.9 C) (Oral)   Ht 5\' 7"  (1.702 m)   Wt 182 lb (82.6 kg)   SpO2 95%   BMI 28.51 kg/m   Wt Readings from Last 3 Encounters:  03/20/19 182 lb (82.6 kg)  05/02/18 195 lb (88.5 kg)  03/06/18 201 lb 6.4 oz (91.4 kg)    Physical Exam Vitals signs and nursing note reviewed.  Constitutional:      General:  He is not in acute distress.    Appearance: Normal appearance. He is not ill-appearing, toxic-appearing or diaphoretic.  HENT:     Head: Normocephalic and atraumatic.     Right Ear: External ear normal.     Left Ear: External ear normal.     Nose: Nose normal.     Mouth/Throat:     Mouth: Mucous membranes are moist.     Pharynx: Oropharynx is clear.  Eyes:     General: No scleral icterus.       Right eye: No discharge.        Left eye: No discharge.     Extraocular Movements: Extraocular movements intact.     Conjunctiva/sclera: Conjunctivae normal.     Pupils: Pupils are equal, round, and reactive to light.  Neck:     Musculoskeletal: Normal range of motion and neck supple.  Cardiovascular:      Rate and Rhythm: Normal rate and regular rhythm.     Pulses: Normal pulses.     Heart sounds: Normal heart sounds. No murmur. No friction rub. No gallop.   Pulmonary:     Effort: Pulmonary effort is normal. No respiratory distress.     Breath sounds: Normal breath sounds. No stridor. No wheezing, rhonchi or rales.  Chest:     Chest wall: No tenderness.  Musculoskeletal: Normal range of motion.  Skin:    General: Skin is warm and dry.     Capillary Refill: Capillary refill takes less than 2 seconds.     Coloration: Skin is not jaundiced or pale.     Findings: No bruising, erythema, lesion or rash.  Neurological:     General: No focal deficit present.     Mental Status: He is alert and oriented to person, place, and time. Mental status is at baseline.  Psychiatric:        Mood and Affect: Mood normal.        Behavior: Behavior normal.        Thought Content: Thought content normal.        Judgment: Judgment normal.     Results for orders placed or performed in visit on 03/06/18  Hgb A1c w/o eAG  Result Value Ref Range   Hgb A1c MFr Bld 7.2 (H) 4.8 - 5.6 %  Comprehensive metabolic panel  Result Value Ref Range   Glucose 174 (H) 65 - 99 mg/dL   BUN 16 6 - 20 mg/dL   Creatinine, Ser 0.90 0.76 - 1.27 mg/dL   GFR calc non Af Amer 111 >59 mL/min/1.73   GFR calc Af Amer 128 >59 mL/min/1.73   BUN/Creatinine Ratio 18 9 - 20   Sodium 138 134 - 144 mmol/L   Potassium 4.5 3.5 - 5.2 mmol/L   Chloride 101 96 - 106 mmol/L   CO2 24 20 - 29 mmol/L   Calcium 9.3 8.7 - 10.2 mg/dL   Total Protein 7.1 6.0 - 8.5 g/dL   Albumin 4.7 3.5 - 5.5 g/dL   Globulin, Total 2.4 1.5 - 4.5 g/dL   Albumin/Globulin Ratio 2.0 1.2 - 2.2   Bilirubin Total <0.2 0.0 - 1.2 mg/dL   Alkaline Phosphatase 113 39 - 117 IU/L   AST 23 0 - 40 IU/L   ALT <5 0 - 44 IU/L  Lipid Panel w/o Chol/HDL Ratio  Result Value Ref Range   Cholesterol, Total 204 (H) 100 - 199 mg/dL   Triglycerides 502 (H) 0 - 149 mg/dL   HDL 33  (  L) >39 mg/dL   VLDL Cholesterol Cal Comment 5 - 40 mg/dL   LDL Calculated Comment 0 - 99 mg/dL      Assessment & Plan:   Problem List Items Addressed This Visit      Endocrine   Uncontrolled type 2 diabetes mellitus (HCC) - Primary    Not under good control with A1c of 13.4- has only been taking his metformin 1x a day- will increase back to 1000mg  BID and recheck 1 month. Call with any concerns.       Relevant Medications   metFORMIN (GLUCOPHAGE) 1000 MG tablet   Other Relevant Orders   Bayer DCA Hb A1c Waived   Comprehensive metabolic panel   Lipid Panel w/o Chol/HDL Ratio   TSH   UA/M w/rflx Culture, Routine   Microalbumin, Urine Waived     Other   Bipolar 2 disorder (HCC)    Doing well on meds, but fatigued. Taking it 5 hours before he has to get up. Make sure to take meds 8 hours before he has to get up. Recheck 1 month.           Follow up plan: Return in about 4 weeks (around 04/17/2019).

## 2019-03-21 LAB — LIPID PANEL W/O CHOL/HDL RATIO
Cholesterol, Total: 146 mg/dL (ref 100–199)
HDL: 43 mg/dL (ref >39)
LDL Calculated: 67 mg/dL (ref 0–99)
Triglycerides: 179 mg/dL — ABNORMAL HIGH (ref 0–149)
VLDL Cholesterol Cal: 36 mg/dL (ref 5–40)

## 2019-03-21 LAB — COMPREHENSIVE METABOLIC PANEL
ALT: <5 IU/L (ref 0–44)
AST: 15 IU/L (ref 0–40)
Albumin/Globulin Ratio: 2.2 (ref 1.2–2.2)
Albumin: 4.7 g/dL (ref 4.0–5.0)
Alkaline Phosphatase: 109 IU/L (ref 39–117)
BUN/Creatinine Ratio: 14 (ref 9–20)
BUN: 14 mg/dL (ref 6–20)
Bilirubin Total: 0.3 mg/dL (ref 0.0–1.2)
CO2: 23 mmol/L (ref 20–29)
Calcium: 9.3 mg/dL (ref 8.7–10.2)
Chloride: 97 mmol/L (ref 96–106)
Creatinine, Ser: 0.99 mg/dL (ref 0.76–1.27)
GFR calc Af Amer: 114 mL/min/{1.73_m2} (ref >59)
GFR calc non Af Amer: 98 mL/min/{1.73_m2} (ref >59)
Globulin, Total: 2.1 g/dL (ref 1.5–4.5)
Glucose: 398 mg/dL — ABNORMAL HIGH (ref 65–99)
Potassium: 4.6 mmol/L (ref 3.5–5.2)
Sodium: 136 mmol/L (ref 134–144)
Total Protein: 6.8 g/dL (ref 6.0–8.5)

## 2019-03-21 LAB — TSH: TSH: 0.988 u[IU]/mL (ref 0.450–4.500)

## 2019-03-22 ENCOUNTER — Encounter: Payer: Self-pay | Admitting: Family Medicine

## 2019-04-20 ENCOUNTER — Ambulatory Visit: Payer: 59 | Admitting: Family Medicine

## 2019-04-24 ENCOUNTER — Ambulatory Visit (INDEPENDENT_AMBULATORY_CARE_PROVIDER_SITE_OTHER): Payer: 59 | Admitting: Family Medicine

## 2019-04-24 ENCOUNTER — Other Ambulatory Visit: Payer: Self-pay

## 2019-04-24 ENCOUNTER — Encounter: Payer: Self-pay | Admitting: Family Medicine

## 2019-04-24 DIAGNOSIS — E1165 Type 2 diabetes mellitus with hyperglycemia: Secondary | ICD-10-CM | POA: Diagnosis not present

## 2019-04-24 DIAGNOSIS — F3181 Bipolar II disorder: Secondary | ICD-10-CM

## 2019-04-24 NOTE — Assessment & Plan Note (Signed)
Encouraged patient to take his medicine. Call with any concerns.

## 2019-04-24 NOTE — Progress Notes (Signed)
There were no vitals taken for this visit.   Subjective:    Patient ID: Alexis [name], male    DOB: Apr 23, 1986, 33 y.o.   MRN: [id number]  HPI: Alexis [name] is a 33 y.o. male  Chief Complaint  Patient presents with  . Diabetes   DIABETES Hypoglycemic episodes:no Polydipsia/polyuria: no Visual disturbance: no Chest pain: no Paresthesias: no Glucose Monitoring: no  Accucheck frequency: Not Checking Taking Insulin?: no Blood Pressure Monitoring: not checking Retinal Examination: Not up to Date Foot Exam: Up to Date Diabetic Education: Completed Pneumovax: Not up to Date Influenza: Refused Aspirin: no  Has not been taking his seroquel. Feeling OK. No other concerns or complaints.   Relevant past medical, surgical, family and social history reviewed and updated as indicated. Interim medical history since our last visit reviewed. Allergies and medications reviewed and updated.  Review of Systems  Constitutional: Negative.   Respiratory: Negative.   Cardiovascular: Negative.   Psychiatric/Behavioral: Negative.     Per HPI unless specifically indicated above     Objective:    There were no vitals taken for this visit.  Wt Readings from Last 3 Encounters:  03/20/19 182 lb (82.6 kg)  05/02/18 195 lb (88.5 kg)  03/06/18 201 lb 6.4 oz (91.4 kg)    Physical Exam Vitals signs and nursing note reviewed.  Constitutional:      General: He is not in acute distress.    Appearance: Normal appearance. He is not ill-appearing, toxic-appearing or diaphoretic.  HENT:     Head: Normocephalic and atraumatic.     Right Ear: External ear normal.     Left Ear: External ear normal.     Nose: Nose normal.     Mouth/Throat:     Mouth: Mucous membranes are moist.     Pharynx: Oropharynx is clear.  Eyes:     General: No scleral icterus.       Right eye: No discharge.        Left eye: No discharge.     Conjunctiva/sclera: Conjunctivae normal.     Pupils: Pupils  are equal, round, and reactive to light.  Neck:     Musculoskeletal: Normal range of motion.  Pulmonary:     Effort: Pulmonary effort is normal. No respiratory distress.     Comments: Speaking in full sentences Musculoskeletal: Normal range of motion.  Skin:    Coloration: Skin is not jaundiced or pale.     Findings: No bruising, erythema, lesion or rash.  Neurological:     Mental Status: He is alert and oriented to person, place, and time. Mental status is at baseline.  Psychiatric:        Mood and Affect: Mood normal.        Behavior: Behavior normal.        Thought Content: Thought content normal.        Judgment: Judgment normal.     Results for orders placed or performed in visit on 03/20/19  Bayer DCA Hb A1c Waived  Result Value Ref Range   HB A1C (BAYER DCA - WAIVED) 13.4 (H) <7.0 %  Comprehensive metabolic panel  Result Value Ref Range   Glucose 398 (H) 65 - 99 mg/dL   BUN 14 6 - 20 mg/dL   Creatinine, Ser 4.090.99 0.76 - 1.27 mg/dL   GFR calc non Af Amer 98 >59 mL/min/1.73   GFR calc Af Amer 114 >59 mL/min/1.73   BUN/Creatinine Ratio 14 9 - 20  Sodium 136 134 - 144 mmol/L   Potassium 4.6 3.5 - 5.2 mmol/L   Chloride 97 96 - 106 mmol/L   CO2 23 20 - 29 mmol/L   Calcium 9.3 8.7 - 10.2 mg/dL   Total Protein 6.8 6.0 - 8.5 g/dL   Albumin 4.7 4.0 - 5.0 g/dL   Globulin, Total 2.1 1.5 - 4.5 g/dL   Albumin/Globulin Ratio 2.2 1.2 - 2.2   Bilirubin Total 0.3 0.0 - 1.2 mg/dL   Alkaline Phosphatase 109 39 - 117 IU/L   AST 15 0 - 40 IU/L   ALT <5 0 - 44 IU/L  Lipid Panel w/o Chol/HDL Ratio  Result Value Ref Range   Cholesterol, Total 146 100 - 199 mg/dL   Triglycerides 179 (H) 0 - 149 mg/dL   HDL 43 >39 mg/dL   VLDL Cholesterol Cal 36 5 - 40 mg/dL   LDL Calculated 67 0 - 99 mg/dL  TSH  Result Value Ref Range   TSH 0.988 0.450 - 4.500 uIU/mL      Assessment & Plan:   Problem List Items Addressed This Visit      Endocrine   Uncontrolled type 2 diabetes mellitus  ([place])    Tolerating the metformin well. Continue to monitor. Recheck 2 months. Call with any concerns.         Other   Bipolar 2 disorder [facility])    Encouraged patient to take his medicine. Call with any concerns.           Follow up plan: Return in about 2 months (around 06/24/2019) for DM.    [name] This visit was completed via Doximity due to the restrictions of the COVID-19 pandemic. All issues as above were discussed and addressed. Physical exam was done as above through visual confirmation on Doximity. If it was felt that the patient should be evaluated in the office, they were directed there. The patient verbally consented to this visit. . Location of the patient: home . Location of the provider: home . Those involved with this call:  . Provider: Park Liter, DO . CMA: Tiffany Reel, CMA . Front Desk/Registration: Don Perking  . Time spent on call: 15 minutes with patient face to face via video conference. More than 50% of this time was spent in counseling and coordination of care. 23 minutes total spent in review of patient's record and preparation of their chart.

## 2019-04-24 NOTE — Assessment & Plan Note (Signed)
Tolerating the metformin well. Continue to monitor. Recheck 2 months. Call with any concerns.

## 2019-07-06 ENCOUNTER — Other Ambulatory Visit: Payer: Self-pay

## 2019-07-06 ENCOUNTER — Encounter: Payer: Self-pay | Admitting: Family Medicine

## 2019-07-06 ENCOUNTER — Ambulatory Visit (INDEPENDENT_AMBULATORY_CARE_PROVIDER_SITE_OTHER): Payer: 59 | Admitting: Family Medicine

## 2019-07-06 DIAGNOSIS — S61012A Laceration without foreign body of left thumb without damage to nail, initial encounter: Secondary | ICD-10-CM | POA: Diagnosis not present

## 2019-07-06 MED ORDER — HYDROCODONE-ACETAMINOPHEN 10-325 MG PO TABS: 1.0000 | ORAL_TABLET | Freq: Three times a day (TID) | ORAL | 0 refills | Status: AC | PRN

## 2019-07-06 NOTE — Progress Notes (Signed)
There were no vitals taken for this visit.   Subjective:    Patient ID: Alexis [name], male    DOB: 1986-07-14, 33 y.o.   MRN: [id number]  HPI: Alexis [name] is a 33 y.o. male  Chief Complaint  Patient presents with  . Laceration    left thumb    . This visit was completed via WebEx due to the restrictions of the COVID-19 pandemic. All issues as above were discussed and addressed. Physical exam was done as above through visual confirmation on WebEx. If it was felt that the patient should be evaluated in the office, they were directed there. The patient verbally consented to this visit. . Location of the patient: work . Location of the provider: work . Those involved with this call:  . Provider: Merrie Roof, PA-C . CMA: Lesle Chris, [place] . Front Desk/Registration: Jill Side  . Time spent on call: 15 minutes with patient face to face via video conference. More than 50% of this time was spent in counseling and coordination of care. 5 minutes total spent in review of patient's record and preparation of their chart. I verified patient identity using two factors (patient name and date of birth). Patient consents verbally to being seen via telemedicine visit today.   Patient presenting today for wide laceration to tip of left thumb that happened yesterday cutting up some deer meat. Having severe throbbing pain in the area of missing skin. Was able to control bleeding with pressure. Trying to keep it clean and covered. No bone or nail involvement, able to move all fingers and sensation intact every besides the area that was shaved off. Using tylenol and ibuprofen with minimal relief of pain. Denies fevers, chills, erythema extending from wound, drainage.   Relevant past medical, surgical, family and social history reviewed and updated as indicated. Interim medical history since our last visit reviewed. Allergies and medications reviewed and updated.  Review of Systems   Per HPI unless specifically indicated above     Objective:    There were no vitals taken for this visit.  Wt Readings from Last 3 Encounters:  03/20/19 182 lb (82.6 kg)  05/02/18 195 lb (88.5 kg)  03/06/18 201 lb 6.4 oz (91.4 kg)    Physical Exam Vitals signs and nursing note reviewed.  Constitutional:      General: He is not in acute distress.    Appearance: Normal appearance.  HENT:     Head: Atraumatic.     Right Ear: External ear normal.     Left Ear: External ear normal.     Nose: Nose normal. No congestion.     Mouth/Throat:     Mouth: Mucous membranes are moist.     Pharynx: Oropharynx is clear.  Eyes:     Extraocular Movements: Extraocular movements intact.     Conjunctiva/sclera: Conjunctivae normal.  Neck:     Musculoskeletal: Normal range of motion.  Cardiovascular:     Rate and Rhythm: Normal rate and regular rhythm.  Pulmonary:     Effort: Pulmonary effort is normal. No respiratory distress.  Musculoskeletal: Normal range of motion.        General: Tenderness (left thumb) present.  Skin:    Comments: Left thumb with full laceration of flesh of fingertip without involvement of nail. No drainage, edema, erythema  Neurological:     Mental Status: He is oriented to person, place, and time.  Psychiatric:        Mood  and Affect: Mood normal.        Thought Content: Thought content normal.        Judgment: Judgment normal.     Results for orders placed or performed in visit on 03/20/19  Bayer DCA Hb A1c Waived  Result Value Ref Range   HB A1C (BAYER DCA - WAIVED) 13.4 (H) <7.0 %  Comprehensive metabolic panel  Result Value Ref Range   Glucose 398 (H) 65 - 99 mg/dL   BUN 14 6 - 20 mg/dL   Creatinine, Ser 7.42 0.76 - 1.27 mg/dL   GFR calc non Af Amer 98 >59 mL/min/1.73   GFR calc Af Amer 114 >59 mL/min/1.73   BUN/Creatinine Ratio 14 9 - 20   Sodium 136 134 - 144 mmol/L   Potassium 4.6 3.5 - 5.2 mmol/L   Chloride 97 96 - 106 mmol/L   CO2 23 20 - 29  mmol/L   Calcium 9.3 8.7 - 10.2 mg/dL   Total Protein 6.8 6.0 - 8.5 g/dL   Albumin 4.7 4.0 - 5.0 g/dL   Globulin, Total 2.1 1.5 - 4.5 g/dL   Albumin/Globulin Ratio 2.2 1.2 - 2.2   Bilirubin Total 0.3 0.0 - 1.2 mg/dL   Alkaline Phosphatase 109 39 - 117 IU/L   AST 15 0 - 40 IU/L   ALT <5 0 - 44 IU/L  Lipid Panel w/o Chol/HDL Ratio  Result Value Ref Range   Cholesterol, Total 146 100 - 199 mg/dL   Triglycerides 595 (H) 0 - 149 mg/dL   HDL 43 >63 mg/dL   VLDL Cholesterol Cal 36 5 - 40 mg/dL   LDL Calculated 67 0 - 99 mg/dL  TSH  Result Value Ref Range   TSH 0.988 0.450 - 4.500 uIU/mL      Assessment & Plan:   Problem List Items Addressed This Visit    None    Visit Diagnoses    Laceration of left thumb without foreign body without damage to nail, initial encounter    -  Primary   Without evidence of infection or bone/tendon involvement. Norco prn for severe pain, continue OTC meds, wound care as discussed       Follow up plan: Return if symptoms worsen or fail to improve.

## 2019-08-26 ENCOUNTER — Ambulatory Visit (INDEPENDENT_AMBULATORY_CARE_PROVIDER_SITE_OTHER): Payer: 59 | Admitting: Family Medicine

## 2019-08-26 ENCOUNTER — Other Ambulatory Visit: Payer: Self-pay

## 2019-08-26 ENCOUNTER — Encounter: Payer: Self-pay | Admitting: Family Medicine

## 2019-08-26 VITALS — BP 123/79 | HR 109

## 2019-08-26 DIAGNOSIS — E1165 Type 2 diabetes mellitus with hyperglycemia: Secondary | ICD-10-CM | POA: Diagnosis not present

## 2019-08-26 DIAGNOSIS — F3181 Bipolar II disorder: Secondary | ICD-10-CM | POA: Diagnosis not present

## 2019-08-26 MED ORDER — METFORMIN HCL ER 500 MG PO TB24: 1000.0000 mg | ORAL_TABLET | Freq: Every day | ORAL | 1 refills | Status: DC

## 2019-08-26 MED ORDER — QUETIAPINE FUMARATE 25 MG PO TABS: 25.0000 mg | ORAL_TABLET | Freq: Every day | ORAL | 1 refills | Status: DC

## 2019-08-26 NOTE — Telephone Encounter (Signed)
Error

## 2019-08-26 NOTE — Progress Notes (Signed)
BP 123/79   Pulse (!) 109    Subjective:    Patient ID: Alexis [name], male    DOB: March 04, 1986, 34 y.o.   MRN: [id number]  HPI: Alexis [name] is a 34 y.o. male  Chief Complaint  Patient presents with  . Diabetes  . Manic Behavior   DIABETES Hypoglycemic episodes:no Polydipsia/polyuria: yes Visual disturbance: no Chest pain: no Paresthesias: no Glucose Monitoring: no Taking Insulin?: no Blood Pressure Monitoring: not checking Retinal Examination: Not up to Date Foot Exam: Up to Date Diabetic Education: Not Completed Pneumovax: Up to Date Influenza: Up to Date Aspirin: no  BIPOLAR- has not been taking his seroquel. Has been feeling well.  Mood status: stable Satisfied with current treatment?: yes Symptom severity: mild  Duration of current treatment : years Side effects: no Medication compliance: poor compliance Psychotherapy/counseling: no  Previous psychiatric medications: seroquel Depressed mood: no Anxious mood: no Anhedonia: no Significant weight loss or gain: no Insomnia: no  Fatigue: no Feelings of worthlessness or guilt: no Impaired concentration/indecisiveness: no Suicidal ideations: no Hopelessness: no Crying spells: no Depression screen [facility] 2/9 03/20/2019 03/06/2018 02/10/2018 08/30/2017 07/27/2016  Decreased Interest 1 1 0 0 0  Down, Depressed, Hopeless 0 0 0 0 0  PHQ - 2 Score 1 1 0 0 0  Altered sleeping 0 2 3 0 -  Tired, decreased energy 1 1 3  0 -  Change in appetite 1 2 3  0 -  Feeling bad or failure about yourself  0 0 3 0 -  Trouble concentrating 0 0 1 0 -  Moving slowly or fidgety/restless 0 0 0 0 -  Suicidal thoughts 0 0 0 0 -  PHQ-9 Score 3 6 13  0 -  Difficult doing work/chores Somewhat difficult Not difficult at all Very difficult - -    Relevant past medical, surgical, family and social history reviewed and updated as indicated. Interim medical history since our last visit reviewed. Allergies and medications reviewed  and updated.  Review of Systems  Constitutional: Negative.   HENT: Negative.   Respiratory: Negative.   Cardiovascular: Negative.   Musculoskeletal: Negative.   Psychiatric/Behavioral: Negative.     Per HPI unless specifically indicated above     Objective:    BP 123/79   Pulse (!) 109   Wt Readings from Last 3 Encounters:  03/20/19 182 lb (82.6 kg)  05/02/18 195 lb (88.5 kg)  03/06/18 201 lb 6.4 oz (91.4 kg)    Physical Exam Vitals and nursing note reviewed.  Constitutional:      General: He is not in acute distress.    Appearance: Normal appearance. He is not ill-appearing, toxic-appearing or diaphoretic.  HENT:     Head: Normocephalic and atraumatic.     Right Ear: External ear normal.     Left Ear: External ear normal.     Nose: Nose normal.     Mouth/Throat:     Mouth: Mucous membranes are moist.     Pharynx: Oropharynx is clear.  Eyes:     General: No scleral icterus.       Right eye: No discharge.        Left eye: No discharge.     Conjunctiva/sclera: Conjunctivae normal.     Pupils: Pupils are equal, round, and reactive to light.  Pulmonary:     Effort: Pulmonary effort is normal. No respiratory distress.     Comments: Speaking in full sentences Musculoskeletal:  General: Normal range of motion.     Cervical back: Normal range of motion.  Skin:    Coloration: Skin is not jaundiced or pale.     Findings: No bruising, erythema, lesion or rash.  Neurological:     Mental Status: He is alert and oriented to person, place, and time. Mental status is at baseline.  Psychiatric:        Mood and Affect: Mood normal.        Behavior: Behavior normal.        Thought Content: Thought content normal.        Judgment: Judgment normal.     Results for orders placed or performed in visit on 08/26/19  UA/M w/rflx Culture, Routine   Specimen: Urine   URINE  Result Value Ref Range   Specific Gravity, UA 1.015 1.005 - 1.030   pH, UA 5.5 5.0 - 7.5   Color,  UA Yellow Yellow   Appearance Ur Clear Clear   Leukocytes,UA Negative Negative   Protein,UA Negative Negative/Trace   Glucose, UA 3+ (A) Negative   Ketones, UA Negative Negative   RBC, UA Negative Negative   Bilirubin, UA Negative Negative   Urobilinogen, Ur 0.2 0.2 - 1.0 mg/dL   Nitrite, UA Negative Negative  TSH  Result Value Ref Range   TSH 0.597 0.450 - 4.500 uIU/mL  CBC with Differential OUT  Result Value Ref Range   WBC 9.6 3.4 - 10.8 x10E3/uL   RBC 5.63 4.14 - 5.80 x10E6/uL   Hemoglobin 15.7 13.0 - 17.7 g/dL   Hematocrit 47.8 37.5 - 51.0 %   MCV 85 79 - 97 fL   MCH 27.9 26.6 - 33.0 pg   MCHC 32.8 31.5 - 35.7 g/dL   RDW 12.9 11.6 - 15.4 %   Platelets 309 150 - 450 x10E3/uL   Neutrophils 68 Not Estab. %   Lymphs 23 Not Estab. %   Monocytes 7 Not Estab. %   Eos 1 Not Estab. %   Basos 1 Not Estab. %   Neutrophils Absolute 6.6 1.4 - 7.0 x10E3/uL   Lymphocytes Absolute 2.2 0.7 - 3.1 x10E3/uL   Monocytes Absolute 0.7 0.1 - 0.9 x10E3/uL   EOS (ABSOLUTE) 0.1 0.0 - 0.4 x10E3/uL   Basophils Absolute 0.1 0.0 - 0.2 x10E3/uL   Immature Granulocytes 0 Not Estab. %   Immature Grans (Abs) 0.0 0.0 - 0.1 x10E3/uL  Microalbumin, Urine Waived  Result Value Ref Range   Microalb, Ur Waived 10 0 - 19 mg/L   Creatinine, Urine Waived 50 10 - 300 mg/dL   Microalb/Creat Ratio 30-300 (H) <30 mg/g  Lipid Panel w/o Chol/HDL Ratio OUT  Result Value Ref Range   Cholesterol, Total 167 100 - 199 mg/dL   Triglycerides 255 (H) 0 - 149 mg/dL   HDL 44 >39 mg/dL   VLDL Cholesterol Cal 42 (H) 5 - 40 mg/dL   LDL Chol Calc (NIH) 81 0 - 99 mg/dL  Comp Met (CMET)  Result Value Ref Range   Glucose 391 (H) 65 - 99 mg/dL   BUN 20 6 - 20 mg/dL   Creatinine, Ser 0.85 0.76 - 1.27 mg/dL   GFR calc non Af Amer 113 >59 mL/min/1.73   GFR calc Af Amer 131 >59 mL/min/1.73   BUN/Creatinine Ratio 24 (H) 9 - 20   Sodium 135 134 - 144 mmol/L   Potassium 4.7 3.5 - 5.2 mmol/L   Chloride 100 96 - 106 mmol/L   CO2  21  20 - 29 mmol/L   Calcium 9.4 8.7 - 10.2 mg/dL   Total Protein 6.9 6.0 - 8.5 g/dL   Albumin 4.5 4.0 - 5.0 g/dL   Globulin, Total 2.4 1.5 - 4.5 g/dL   Albumin/Globulin Ratio 1.9 1.2 - 2.2   Bilirubin Total 0.3 0.0 - 1.2 mg/dL   Alkaline Phosphatase 130 (H) 39 - 117 IU/L   AST 14 0 - 40 IU/L   ALT <5 0 - 44 IU/L  Bayer DCA Hb A1c Waived  Result Value Ref Range   HB A1C (BAYER DCA - WAIVED) 9.5 (H) <7.0 %      Assessment & Plan:   Problem List Items Addressed This Visit      Endocrine   Uncontrolled type 2 diabetes mellitus ([place]) - Primary    Needs to come in for A1c. Will get results and adjust medicine. Has not been taking his medicine regularly in the morning- will start taking his medicine just at night unless the he has bad GI issues. Call with any concerns.       Relevant Medications   metFORMIN (GLUCOPHAGE-XR) 500 MG 24 hr tablet   Other Relevant Orders   Bayer DCA Hb A1c Waived (Completed)   Comp Met (CMET) (Completed)   Lipid Panel w/o Chol/HDL Ratio OUT (Completed)   Microalbumin, Urine Waived (Completed)   CBC with Differential OUT (Completed)   TSH (Completed)   UA/M w/rflx Culture, Routine (Completed)     Other   Bipolar 2 disorder (HCC)    Stable off medicine. Encouraged him to take his medicine- refill given. Call with any concerns or if getting worse.       Relevant Orders   TSH (Completed)       Follow up plan: Return in about 3 months (around 11/24/2019).   . This visit was completed via FaceTime due to the restrictions of the COVID-19 pandemic. All issues as above were discussed and addressed. Physical exam was done as above through visual confirmation on FaceTime. If it was felt that the patient should be evaluated in the office, they were directed there. The patient verbally consented to this visit. . Location of the patient: home . Location of the provider: home . Those involved with this call:  . Provider: Park Liter, DO . CMA: Yvonna Alanis, [place] . Front Desk/Registration: Don Perking  . Time spent on call: 25 minutes with patient face to face via video conference. More than 50% of this time was spent in counseling and coordination of care. 40 minutes total spent in review of patient's record and preparation of their chart.

## 2019-08-27 ENCOUNTER — Encounter: Payer: Self-pay | Admitting: Family Medicine

## 2019-08-27 LAB — COMPREHENSIVE METABOLIC PANEL
ALT: <5 IU/L (ref 0–44)
AST: 14 IU/L (ref 0–40)
Albumin/Globulin Ratio: 1.9 (ref 1.2–2.2)
Albumin: 4.5 g/dL (ref 4.0–5.0)
Alkaline Phosphatase: 130 IU/L — ABNORMAL HIGH (ref 39–117)
BUN/Creatinine Ratio: 24 — ABNORMAL HIGH (ref 9–20)
BUN: 20 mg/dL (ref 6–20)
Bilirubin Total: 0.3 mg/dL (ref 0.0–1.2)
CO2: 21 mmol/L (ref 20–29)
Calcium: 9.4 mg/dL (ref 8.7–10.2)
Chloride: 100 mmol/L (ref 96–106)
Creatinine, Ser: 0.85 mg/dL (ref 0.76–1.27)
GFR calc Af Amer: 131 mL/min/{1.73_m2} (ref >59)
GFR calc non Af Amer: 113 mL/min/{1.73_m2} (ref >59)
Globulin, Total: 2.4 g/dL (ref 1.5–4.5)
Glucose: 391 mg/dL — ABNORMAL HIGH (ref 65–99)
Potassium: 4.7 mmol/L (ref 3.5–5.2)
Sodium: 135 mmol/L (ref 134–144)
Total Protein: 6.9 g/dL (ref 6.0–8.5)

## 2019-08-27 LAB — CBC WITH DIFFERENTIAL/PLATELET
Basophils Absolute: 0.1 10*3/uL (ref 0.0–0.2)
Basos: 1 %
EOS (ABSOLUTE): 0.1 10*3/uL (ref 0.0–0.4)
Eos: 1 %
Hematocrit: 47.8 % (ref 37.5–51.0)
Hemoglobin: 15.7 g/dL (ref 13.0–17.7)
Immature Grans (Abs): 0 10*3/uL (ref 0.0–0.1)
Immature Granulocytes: 0 %
Lymphocytes Absolute: 2.2 10*3/uL (ref 0.7–3.1)
Lymphs: 23 %
MCH: 27.9 pg (ref 26.6–33.0)
MCHC: 32.8 g/dL (ref 31.5–35.7)
MCV: 85 fL (ref 79–97)
Monocytes Absolute: 0.7 10*3/uL (ref 0.1–0.9)
Monocytes: 7 %
Neutrophils Absolute: 6.6 10*3/uL (ref 1.4–7.0)
Neutrophils: 68 %
Platelets: 309 10*3/uL (ref 150–450)
RBC: 5.63 x10E6/uL (ref 4.14–5.80)
RDW: 12.9 % (ref 11.6–15.4)
WBC: 9.6 10*3/uL (ref 3.4–10.8)

## 2019-08-27 LAB — UA/M W/RFLX CULTURE, ROUTINE
Bilirubin, UA: NEGATIVE
Ketones, UA: NEGATIVE
Leukocytes,UA: NEGATIVE
Nitrite, UA: NEGATIVE
Protein,UA: NEGATIVE
RBC, UA: NEGATIVE
Specific Gravity, UA: 1.015 (ref 1.005–1.030)
Urobilinogen, Ur: 0.2 mg/dL (ref 0.2–1.0)
pH, UA: 5.5 (ref 5.0–7.5)

## 2019-08-27 LAB — LIPID PANEL W/O CHOL/HDL RATIO
Cholesterol, Total: 167 mg/dL (ref 100–199)
HDL: 44 mg/dL (ref >39)
LDL Chol Calc (NIH): 81 mg/dL (ref 0–99)
Triglycerides: 255 mg/dL — ABNORMAL HIGH (ref 0–149)
VLDL Cholesterol Cal: 42 mg/dL — ABNORMAL HIGH (ref 5–40)

## 2019-08-27 LAB — MICROALBUMIN, URINE WAIVED
Creatinine, Urine Waived: 50 mg/dL (ref 10–300)
Microalb, Ur Waived: 10 mg/L (ref 0–19)

## 2019-08-27 LAB — BAYER DCA HB A1C WAIVED: HB A1C (BAYER DCA - WAIVED): 9.5 % — ABNORMAL HIGH (ref <7.0)

## 2019-08-27 LAB — TSH: TSH: 0.597 u[IU]/mL (ref 0.450–4.500)

## 2019-08-30 ENCOUNTER — Encounter: Payer: Self-pay | Admitting: Family Medicine

## 2019-08-30 NOTE — Assessment & Plan Note (Signed)
Needs to come in for A1c. Will get results and adjust medicine. Has not been taking his medicine regularly in the morning- will start taking his medicine just at night unless the he has bad GI issues. Call with any concerns.

## 2019-08-30 NOTE — Assessment & Plan Note (Signed)
Stable off medicine. Encouraged him to take his medicine- refill given. Call with any concerns or if getting worse.

## 2019-11-27 ENCOUNTER — Ambulatory Visit: Payer: 59 | Admitting: Family Medicine

## 2019-12-24 ENCOUNTER — Other Ambulatory Visit: Payer: 59

## 2019-12-24 ENCOUNTER — Encounter: Payer: Self-pay | Admitting: Family Medicine

## 2019-12-24 ENCOUNTER — Other Ambulatory Visit: Payer: Self-pay

## 2019-12-24 ENCOUNTER — Telehealth (INDEPENDENT_AMBULATORY_CARE_PROVIDER_SITE_OTHER): Payer: 59 | Admitting: Family Medicine

## 2019-12-24 VITALS — BP 148/85 | HR 118 | Ht 66.69 in | Wt 172.2 lb

## 2019-12-24 DIAGNOSIS — E1165 Type 2 diabetes mellitus with hyperglycemia: Secondary | ICD-10-CM | POA: Diagnosis not present

## 2019-12-24 DIAGNOSIS — R109 Unspecified abdominal pain: Secondary | ICD-10-CM | POA: Diagnosis not present

## 2019-12-24 LAB — UA/M W/RFLX CULTURE, ROUTINE
Bilirubin, UA: NEGATIVE
Leukocytes,UA: NEGATIVE
Nitrite, UA: NEGATIVE
Protein,UA: NEGATIVE
RBC, UA: NEGATIVE
Specific Gravity, UA: 1.015 (ref 1.005–1.030)
Urobilinogen, Ur: 0.2 mg/dL (ref 0.2–1.0)
pH, UA: 5 (ref 5.0–7.5)

## 2019-12-24 LAB — BAYER DCA HB A1C WAIVED: HB A1C (BAYER DCA - WAIVED): 13.9 % — ABNORMAL HIGH (ref <7.0)

## 2019-12-24 LAB — MICROALBUMIN, URINE WAIVED
Creatinine, Urine Waived: 50 mg/dL (ref 10–300)
Microalb, Ur Waived: 10 mg/L (ref 0–19)

## 2019-12-24 MED ORDER — METFORMIN HCL ER 500 MG PO TB24: 1000.0000 mg | ORAL_TABLET | Freq: Two times a day (BID) | ORAL | 1 refills | Status: DC

## 2019-12-24 MED ORDER — LISINOPRIL 5 MG PO TABS: 5.0000 mg | ORAL_TABLET | Freq: Every day | ORAL | 3 refills | Status: DC

## 2019-12-24 MED ORDER — TRULICITY 0.75 MG/0.5ML ~~LOC~~ SOAJ: 0.7500 mg | SUBCUTANEOUS | 2 refills | Status: DC

## 2019-12-24 MED ORDER — ATORVASTATIN CALCIUM 40 MG PO TABS
40.0000 mg | ORAL_TABLET | Freq: Every day | ORAL | 3 refills | Status: DC
Start: 2019-12-24 — End: 2020-08-15

## 2019-12-24 NOTE — Assessment & Plan Note (Signed)
Not under good control. A1c back up to 13.9. Patient states that he has been taking his medicine. Will add trulicity and get CCM involved. Call with any concerns.

## 2019-12-24 NOTE — Progress Notes (Signed)
BP (!) 148/85   Pulse (!) 118   Ht 5' 6.69" (1.694 m)   Wt 172 lb 3.2 oz (78.1 kg)   SpO2 96%   BMI 27.22 kg/m    Subjective:    Patient ID: Alexis [name], male    DOB: [birth year], 34 y.o.   MRN: [id number]  HPI: Alexis [name] is a 34 y.o. male  Chief Complaint  Patient presents with  . Diabetes  . Back Pain   DIABETES- has lost about 10lbs since last visit. Has been taking his medicine. Has not been watching his diet Hypoglycemic episodes:no Polydipsia/polyuria: no Visual disturbance: no Chest pain: no Paresthesias: no Glucose Monitoring: no  Accucheck frequency: Not Checking Taking Insulin?: no Blood Pressure Monitoring: not checking Retinal Examination: Not up to Date Foot Exam: Up to Date Diabetic Education: Not Completed Pneumovax: Up to Date Influenza: Not up to Date Aspirin: no  Relevant past medical, surgical, family and social history reviewed and updated as indicated. Interim medical history since our last visit reviewed. Allergies and medications reviewed and updated.  Review of Systems  Constitutional: Negative.   Respiratory: Negative.   Cardiovascular: Negative.   Gastrointestinal: Negative.   Musculoskeletal: Negative.   Psychiatric/Behavioral: Negative.     Per HPI unless specifically indicated above     Objective:    BP (!) 148/85   Pulse (!) 118   Ht 5' 6.69" (1.694 m)   Wt 172 lb 3.2 oz (78.1 kg)   SpO2 96%   BMI 27.22 kg/m   Wt Readings from Last 3 Encounters:  12/24/19 172 lb 3.2 oz (78.1 kg)  03/20/19 182 lb (82.6 kg)  05/02/18 195 lb (88.5 kg)    Physical Exam Constitutional:      General: He is not in acute distress.    Appearance: Normal appearance. He is well-developed. He is not ill-appearing, toxic-appearing or diaphoretic.  HENT:     Head: Normocephalic and atraumatic.     Right Ear: Hearing and external ear normal.     Left Ear: Hearing and external ear normal.     Nose: Nose normal.  Eyes:   General: Lids are normal. No scleral icterus.       Right eye: No discharge.        Left eye: No discharge.     Extraocular Movements: Extraocular movements intact.     Conjunctiva/sclera: Conjunctivae normal.     Pupils: Pupils are equal, round, and reactive to light.  Pulmonary:     Effort: Pulmonary effort is normal. No respiratory distress.  Musculoskeletal:        General: Normal range of motion.     Cervical back: Normal range of motion.  Skin:    Coloration: Skin is not jaundiced or pale.     Findings: No bruising, erythema, lesion or rash.  Neurological:     General: No focal deficit present.     Mental Status: He is alert and oriented to person, place, and time.  Psychiatric:        Mood and Affect: Mood normal.        Speech: Speech normal.        Behavior: Behavior normal.        Thought Content: Thought content normal.        Judgment: Judgment normal.     Results for orders placed or performed in visit on 08/26/19  UA/M w/rflx Culture, Routine   Specimen: Urine   URINE  Result Value  Ref Range   Specific Gravity, UA 1.015 1.005 - 1.030   pH, UA 5.5 5.0 - 7.5   Color, UA Yellow Yellow   Appearance Ur Clear Clear   Leukocytes,UA Negative Negative   Protein,UA Negative Negative/Trace   Glucose, UA 3+ (A) Negative   Ketones, UA Negative Negative   RBC, UA Negative Negative   Bilirubin, UA Negative Negative   Urobilinogen, Ur 0.2 0.2 - 1.0 mg/dL   Nitrite, UA Negative Negative  TSH  Result Value Ref Range   TSH 0.597 0.450 - 4.500 uIU/mL  CBC with Differential OUT  Result Value Ref Range   WBC 9.6 3.4 - 10.8 x10E3/uL   RBC 5.63 4.14 - 5.80 x10E6/uL   Hemoglobin 15.7 13.0 - 17.7 g/dL   Hematocrit 47.8 37.5 - 51.0 %   MCV 85 79 - 97 fL   MCH 27.9 26.6 - 33.0 pg   MCHC 32.8 31.5 - 35.7 g/dL   RDW 12.9 11.6 - 15.4 %   Platelets 309 150 - 450 x10E3/uL   Neutrophils 68 Not Estab. %   Lymphs 23 Not Estab. %   Monocytes 7 Not Estab. %   Eos 1 Not Estab. %    Basos 1 Not Estab. %   Neutrophils Absolute 6.6 1.4 - 7.0 x10E3/uL   Lymphocytes Absolute 2.2 0.7 - 3.1 x10E3/uL   Monocytes Absolute 0.7 0.1 - 0.9 x10E3/uL   EOS (ABSOLUTE) 0.1 0.0 - 0.4 x10E3/uL   Basophils Absolute 0.1 0.0 - 0.2 x10E3/uL   Immature Granulocytes 0 Not Estab. %   Immature Grans (Abs) 0.0 0.0 - 0.1 x10E3/uL  Microalbumin, Urine Waived  Result Value Ref Range   Microalb, Ur Waived 10 0 - 19 mg/L   Creatinine, Urine Waived 50 10 - 300 mg/dL   Microalb/Creat Ratio 30-300 (H) <30 mg/g  Lipid Panel w/o Chol/HDL Ratio OUT  Result Value Ref Range   Cholesterol, Total 167 100 - 199 mg/dL   Triglycerides 255 (H) 0 - 149 mg/dL   HDL 44 >39 mg/dL   VLDL Cholesterol Cal 42 (H) 5 - 40 mg/dL   LDL Chol Calc (NIH) 81 0 - 99 mg/dL  Comp Met (CMET)  Result Value Ref Range   Glucose 391 (H) 65 - 99 mg/dL   BUN 20 6 - 20 mg/dL   Creatinine, Ser 0.85 0.76 - 1.27 mg/dL   GFR calc non Af Amer 113 >59 mL/min/1.73   GFR calc Af Amer 131 >59 mL/min/1.73   BUN/Creatinine Ratio 24 (H) 9 - 20   Sodium 135 134 - 144 mmol/L   Potassium 4.7 3.5 - 5.2 mmol/L   Chloride 100 96 - 106 mmol/L   CO2 21 20 - 29 mmol/L   Calcium 9.4 8.7 - 10.2 mg/dL   Total Protein 6.9 6.0 - 8.5 g/dL   Albumin 4.5 4.0 - 5.0 g/dL   Globulin, Total 2.4 1.5 - 4.5 g/dL   Albumin/Globulin Ratio 1.9 1.2 - 2.2   Bilirubin Total 0.3 0.0 - 1.2 mg/dL   Alkaline Phosphatase 130 (H) 39 - 117 IU/L   AST 14 0 - 40 IU/L   ALT <5 0 - 44 IU/L  Bayer DCA Hb A1c Waived  Result Value Ref Range   HB A1C (BAYER DCA - WAIVED) 9.5 (H) <7.0 %      Assessment & Plan:   Problem List Items Addressed This Visit      Endocrine   Uncontrolled type 2 diabetes mellitus ([place]) - Primary  Not under good control. A1c back up to 13.9. Patient states that he has been taking his medicine. Will add trulicity and get CCM involved. Call with any concerns.       Relevant Medications   Dulaglutide (TRULICITY) 1.61 WR/6.0AV SOPN   metFORMIN  (GLUCOPHAGE-XR) 500 MG 24 hr tablet   atorvastatin (LIPITOR) 40 MG tablet   lisinopril (ZESTRIL) 5 MG tablet   Other Relevant Orders   Bayer DCA Hb A1c Waived   CBC with Differential/Platelet   Comprehensive metabolic panel   Lipid Panel w/o Chol/HDL Ratio   Microalbumin, Urine Waived   TSH   UA/M w/rflx Culture, Routine   Referral to Chronic Care Management Services    Other Visit Diagnoses    Flank pain       Will check labs await results.    Relevant Orders   Comprehensive metabolic panel   TSH   UA/M w/rflx Culture, Routine       Follow up plan: Return in about 4 weeks (around 01/21/2020) for DM follow up.    . This visit was completed via MyChart due to the restrictions of the COVID-19 pandemic. All issues as above were discussed and addressed. Physical exam was done as above through visual confirmation on MyChart. If it was felt that the patient should be evaluated in the office, they were directed there. The patient verbally consented to this visit. . Location of the patient: home . Location of the provider: work . Those involved with this call:  . Provider: Park Liter, DO . CMA: Lauretta Grill, RMA . Front Desk/Registration: Don Perking  . Time spent on call: 15 minutes with patient face to face via video conference. More than 50% of this time was spent in counseling and coordination of care. 23 minutes total spent in review of patient's record and preparation of their chart.

## 2019-12-25 ENCOUNTER — Encounter: Payer: Self-pay | Admitting: Family Medicine

## 2019-12-25 ENCOUNTER — Telehealth: Payer: Self-pay | Admitting: Family Medicine

## 2019-12-25 LAB — COMPREHENSIVE METABOLIC PANEL
ALT: <5 IU/L (ref 0–44)
AST: 14 IU/L (ref 0–40)
Albumin/Globulin Ratio: 1.8 (ref 1.2–2.2)
Albumin: 4.4 g/dL (ref 4.0–5.0)
Alkaline Phosphatase: 115 IU/L (ref 39–117)
BUN/Creatinine Ratio: 17 (ref 9–20)
BUN: 16 mg/dL (ref 6–20)
Bilirubin Total: 0.3 mg/dL (ref 0.0–1.2)
CO2: 24 mmol/L (ref 20–29)
Calcium: 9.3 mg/dL (ref 8.7–10.2)
Chloride: 96 mmol/L (ref 96–106)
Creatinine, Ser: 0.93 mg/dL (ref 0.76–1.27)
GFR calc Af Amer: 122 mL/min/{1.73_m2} (ref >59)
GFR calc non Af Amer: 105 mL/min/{1.73_m2} (ref >59)
Globulin, Total: 2.4 g/dL (ref 1.5–4.5)
Glucose: 405 mg/dL — ABNORMAL HIGH (ref 65–99)
Potassium: 4.6 mmol/L (ref 3.5–5.2)
Sodium: 134 mmol/L (ref 134–144)
Total Protein: 6.8 g/dL (ref 6.0–8.5)

## 2019-12-25 LAB — CBC WITH DIFFERENTIAL/PLATELET
Basophils Absolute: 0 10*3/uL (ref 0.0–0.2)
Basos: 1 %
EOS (ABSOLUTE): 0 10*3/uL (ref 0.0–0.4)
Eos: 1 %
Hematocrit: 45.4 % (ref 37.5–51.0)
Hemoglobin: 15.2 g/dL (ref 13.0–17.7)
Immature Grans (Abs): 0 10*3/uL (ref 0.0–0.1)
Immature Granulocytes: 0 %
Lymphocytes Absolute: 2.4 10*3/uL (ref 0.7–3.1)
Lymphs: 32 %
MCH: 28 pg (ref 26.6–33.0)
MCHC: 33.5 g/dL (ref 31.5–35.7)
MCV: 84 fL (ref 79–97)
Monocytes Absolute: 0.6 10*3/uL (ref 0.1–0.9)
Monocytes: 8 %
Neutrophils Absolute: 4.4 10*3/uL (ref 1.4–7.0)
Neutrophils: 58 %
Platelets: 289 10*3/uL (ref 150–450)
RBC: 5.42 x10E6/uL (ref 4.14–5.80)
RDW: 12.6 % (ref 11.6–15.4)
WBC: 7.5 10*3/uL (ref 3.4–10.8)

## 2019-12-25 LAB — LIPID PANEL W/O CHOL/HDL RATIO
Cholesterol, Total: 158 mg/dL (ref 100–199)
HDL: 45 mg/dL (ref >39)
LDL Chol Calc (NIH): 79 mg/dL (ref 0–99)
Triglycerides: 205 mg/dL — ABNORMAL HIGH (ref 0–149)
VLDL Cholesterol Cal: 34 mg/dL (ref 5–40)

## 2019-12-25 LAB — TSH: TSH: 0.585 u[IU]/mL (ref 0.450–4.500)

## 2019-12-25 NOTE — Chronic Care Management (AMB) (Signed)
  Care Management   Note  12/25/2019 Name: ITHAN TOUHEY MRN: [id number] DOB: 1985-11-19  Alexis [name] is a 34 y.o. year old male who is a primary care patient of Dorcas Carrow, DO. I reached out to Alexis [name] by phone today in response to a referral sent by Mr. KINSTON MAGNAN [name]'s health plan.    Mr. Crisco was given information about care management services today including:  1. Care management services include personalized support from designated clinical staff supervised by his physician, including individualized plan of care and coordination with other care providers 2. 24/7 contact phone numbers for assistance for urgent and routine care needs. 3. The patient may stop care management services at any time by phone call to the office staff.  Patient agreed to services and verbal consent obtained.   Follow up plan: Telephone appointment with care management team member scheduled for:02/12/2020  Elisha Ponder, LPN Health Advisor, Embedded Care Coordination [facility] Health Care Management ??[email or web address] ??[phone]

## 2019-12-28 ENCOUNTER — Telehealth: Payer: Self-pay

## 2019-12-28 MED ORDER — OMEPRAZOLE 20 MG PO CPDR
20.0000 mg | DELAYED_RELEASE_CAPSULE | Freq: Every day | ORAL | 3 refills | Status: DC
Start: 2019-12-28 — End: 2020-02-05

## 2019-12-28 NOTE — Telephone Encounter (Signed)
Patient requesting RX for omeprazole to help with acid reflux. Patient uses [facility].

## 2020-01-05 ENCOUNTER — Ambulatory Visit: Payer: Self-pay | Admitting: Pharmacist

## 2020-01-05 DIAGNOSIS — E1165 Type 2 diabetes mellitus with hyperglycemia: Secondary | ICD-10-CM

## 2020-01-05 NOTE — Chronic Care Management (AMB) (Signed)
Chronic Care Management   Note  01/05/2020 Name: Alexis [name] MRN: [id number] DOB: 03-22-1986   Subjective:  Alexis [name] is a 34 y.o. year old male who is a primary care patient of Valerie Roys, DO. The CCM team was consulted for assistance with chronic disease management and care coordination needs.    Contacted patient and fiance for medication access concerns  Review of patient status, including review of consultants reports, laboratory and other test data, was performed as part of comprehensive evaluation and provision of chronic care management services.   SDOH (Social Determinants of Health) assessments and interventions performed:  SDOH Interventions     Most Recent Value  SDOH Interventions  Financial Strain Interventions  Other (Comment) [educated on manufacturer coupons]     yes  Objective:  Lab Results  Component Value Date   CREATININE 0.93 12/24/2019   CREATININE 0.85 08/26/2019   CREATININE 0.99 03/20/2019    Lab Results  Component Value Date   HGBA1C 13.9 (H) 12/24/2019       Component Value Date/Time   CHOL 158 12/24/2019 1523   CHOL 125 07/27/2016 0806   TRIG 205 (H) 12/24/2019 1523   TRIG 109 07/27/2016 0806   HDL 45 12/24/2019 1523   VLDL 22 07/27/2016 0806   LDLCALC 79 12/24/2019 1523    Clinical ASCVD: No  The ASCVD Risk score [name], et al., 2013) failed to calculate for the following reasons:   The 2013 ASCVD risk score is only valid for ages 24 to 57    BP Readings from Last 3 Encounters:  12/24/19 (!) 148/85  08/26/19 123/79  03/20/19 121/81    No Known Allergies  Medications Reviewed Today    Reviewed by Alexis [name], CMA (Certified Medical Assistant) on 12/24/19 at [place] List Status: <None>  Medication Order Taking? Sig Documenting Provider Last Dose Status Informant  metFORMIN (GLUCOPHAGE-XR) 500 MG 24 hr tablet [id number] Yes Take 2 tablets (1,000 mg total) by mouth daily with breakfast.    Patient taking differently: Take 500 mg by mouth in the morning and at bedtime.    Johnson, Megan P, DO Taking Active   QUEtiapine (SEROQUEL) 25 MG tablet [id number] No Take 1 tablet (25 mg total) by mouth at bedtime.  Patient not taking: Reported on 12/24/2019   Valerie Roys, DO Not Taking Active            Assessment:   Goals Addressed            This Visit's Progress     Patient Stated   . PharmD "I need to get my diabetes under better control" (pt-stated)       CARE PLAN ENTRY (see longtitudinal plan of care for additional care plan information)  Current Barriers:  . Diabetes: uncontrolled; complicated by chronic medical conditions including bipolar disorder, most recent A1c 13.1% o Works as a Clinical cytogeneticist. Worries about stomach upset and having to use customer bathrooms. . Most recent eGFR: >100 mL/min . Current antihyperglycemic regimen: metformin XR 1000 mg QAM, 1000 mg QPM - reports that prior to last visit w/ Dr. Wynetta Emery, he was just taking 2 QPM. Some stomach upset. Started Trulicity 0.86 mg weekly via samples, has not tried to fill w/ insurance yet . Cardiovascular risk reduction: o Current hypertensive regimen: lisinopril 5 mg daily - just started over the weekend o Current hyperlipidemia regimen: atorvastatin 40 mg daily - just started over the weekend o Current  antiplatelet regimen: n/a . GERD: omeprazole 20 mg daily- reports he has not started . Bipolar disorder - is prescribed quetiapine, but has not been taking because it makes him feel "weird" ; even when taking at night, he struggled with next morning drowsiness  Pharmacist Clinical Goal(s):  [name] Over the next 90 days, patient will work with PharmD and primary care provider to address optimized medication management  Interventions: . Comprehensive medication review performed, medication list updated in electronic medical record . Inter-disciplinary care team collaboration (see longitudinal plan of  care) . Reviewed goal A1c, goal fasting, and goal 2 hour post prandial glucose. Will provide written information to fiance, Alexis [name] (on DPR) . Reviewed MOA and side effects of metformin and Trulicity. Will advise to pause on metformin titration if significant GI upset with starting Trulicity and titrating metformin at the same time . Reviewed indication for lisinopril and atorvastatin . Patient unsure the cost of Trulicity as he hasn't needed to fill it yet. Will instruct Alexis [name] on how to download coupon card to use at the pharmacy in combination w/ commercial insurance.  . Introduced role of RN CM on team. Reminded of appt in July.  Patient Self Care Activities:  . Patient will check blood glucose BID, document, and provide at future appointments . Patient will take medications as prescribed . Patient will report any questions or concerns to provider   Initial goal documentation        Plan: - Scheduled f/u call in ~ 12 weeks  Catie Darnelle Maffucci, PharmD, [place] [phone]

## 2020-01-05 NOTE — Patient Instructions (Addendum)
Alexis [name] and Alexis [name],   A1c is a 3 month average of your blood sugars. Our goal A1c for you is <7%. This is the best way to reduce your risk of heart attacks and strokes, as diabetes increases the risk of these events. An A1c <7% generally corresponds with fasting sugars <130 and 2 hour after meal sugars <180. I would recommend you check blood sugars at both of these times (fasting and after the biggest meal of your day) to start to see the impact of taking your medications.   We have you on:  - Metformin - we want to maximize this medication by having you take 2 tablets in the morning and 2 in the evening, however, this can cause stomach upset when first increasing the dose.  - Trulicity 7.59 mg weekly - this medication comes as 3 higher strengths, so we will be able to increase this over time to help lower your blood sugars.This medication tells your pancreas to release insulin when your sugars are high, and also slows how quickly your stomach empties into your intestines. This prevents your blood sugar from spiking as much after meals, and also decreases your appetite - so this may help with weight loss.   Increasing metformin and adding Trulicity at the same time may both upset your stomach, so just be aware of that. This stomach upset generally gets better over time as your body gets used to the medications, but do feel free to call me or talk to Dr. Wynetta Emery if things aren't improving.   If the Trulicity ends up being more than $25 on your insurance, go to the [employer] and sign up for a savings card (https://wright.info/). Take this with you to the pharmacy to use in combination with your insurance.   Lisinopril is for your blood pressure. Our goal is for this to be <130/80 to best reduce your risk of heart disease or kidney damage.   Atorvastatin is for your cholesterol. This medication itself helps protect your vessels from getting clogged, but we also want to see your  bad cholesterol, or LDL, less than 70.  Please call me with any questions! See the scheduled phone call appointments with Pam (our nurse) and I (pharmacist), and let us know if you need to reschedule.   Catie Darnelle Maffucci, PharmD [phone]  Visit Information  Goals Addressed            This Visit's Progress     Patient Stated   . PharmD "I need to get my diabetes under better control" (pt-stated)       CARE PLAN ENTRY (see longtitudinal plan of care for additional care plan information)  Current Barriers:  . Diabetes: uncontrolled; complicated by chronic medical conditions including bipolar disorder, most recent A1c 13.1% o Works as a Clinical cytogeneticist. Worries about stomach upset and having to use customer bathrooms. . Most recent eGFR: >100 mL/min . Current antihyperglycemic regimen: metformin XR 1000 mg QAM, 1000 mg QPM - reports that prior to last visit w/ Dr. Wynetta Emery, he was just taking 2 QPM. Some stomach upset. Started Trulicity 3.57 mg weekly via samples, has not tried to fill w/ insurance yet . Cardiovascular risk reduction: o Current hypertensive regimen: lisinopril 5 mg daily - just started over the weekend o Current hyperlipidemia regimen: atorvastatin 40 mg daily - just started over the weekend o Current antiplatelet regimen: n/a . GERD: omeprazole 20 mg daily- reports he has not started . Bipolar disorder - is prescribed quetiapine, but has  not been taking because it makes him feel "weird" ; even when taking at night, he struggled with next morning drowsiness  Pharmacist Clinical Goal(s):  [name] Over the next 90 days, patient will work with PharmD and primary care provider to address optimized medication management  Interventions: . Comprehensive medication review performed, medication list updated in electronic medical record . Inter-disciplinary care team collaboration (see longitudinal plan of care) . Reviewed goal A1c, goal fasting, and goal 2 hour post prandial glucose. Will  provide written information to fiance, Alexis [name] (on DPR) . Reviewed MOA and side effects of metformin and Trulicity. Will advise to pause on metformin titration if significant GI upset with starting Trulicity and titrating metformin at the same time . Reviewed indication for lisinopril and atorvastatin . Patient unsure the cost of Trulicity as he hasn't needed to fill it yet. Will instruct Alexis [name] on how to download coupon card to use at the pharmacy in combination w/ commercial insurance.  . Introduced role of RN CM on team. Reminded of appt in July.  Patient Self Care Activities:  . Patient will check blood glucose BID, document, and provide at future appointments . Patient will take medications as prescribed . Patient will report any questions or concerns to provider   Initial goal documentation        The patient verbalized understanding of instructions provided today and agreed to receive a mailed copy of patient instruction and/or educational materials.  Plan:  - Scheduled f/u call in ~ 12 weeks  Catie Darnelle Maffucci, PharmD, [place]  [phone]

## 2020-02-05 ENCOUNTER — Encounter: Payer: Self-pay | Admitting: Family Medicine

## 2020-02-05 ENCOUNTER — Ambulatory Visit (INDEPENDENT_AMBULATORY_CARE_PROVIDER_SITE_OTHER): Payer: 59 | Admitting: Family Medicine

## 2020-02-05 ENCOUNTER — Telehealth: Payer: Self-pay | Admitting: Family Medicine

## 2020-02-05 ENCOUNTER — Other Ambulatory Visit: Payer: Self-pay

## 2020-02-05 VITALS — BP 116/75 | HR 99 | Temp 98.0°F | Wt 171.0 lb

## 2020-02-05 DIAGNOSIS — E1165 Type 2 diabetes mellitus with hyperglycemia: Secondary | ICD-10-CM | POA: Diagnosis not present

## 2020-02-05 DIAGNOSIS — W57XXXA Bitten or stung by nonvenomous insect and other nonvenomous arthropods, initial encounter: Secondary | ICD-10-CM

## 2020-02-05 MED ORDER — TRIAMCINOLONE ACETONIDE 0.5 % EX OINT
1.0000 | TOPICAL_OINTMENT | Freq: Two times a day (BID) | CUTANEOUS | 0 refills | Status: DC
Start: 2020-02-05 — End: 2020-08-15

## 2020-02-05 MED ORDER — PANTOPRAZOLE SODIUM 40 MG PO TBEC: 40.0000 mg | DELAYED_RELEASE_TABLET | Freq: Every day | ORAL | 3 refills | Status: DC

## 2020-02-05 MED ORDER — TRIAMCINOLONE ACETONIDE 40 MG/ML IJ SUSP
40.0000 mg | Freq: Once | INTRAMUSCULAR | Status: AC
  Administered 2020-02-05: 40 mg via INTRAMUSCULAR

## 2020-02-05 MED ORDER — TRULICITY 0.75 MG/0.5ML ~~LOC~~ SOAJ: 1.5000 mg | SUBCUTANEOUS | 2 refills | Status: DC

## 2020-02-05 NOTE — Assessment & Plan Note (Signed)
Sugars still running high. Will continue 1.5mg  of the trulicity and the metformin. Will likely need to go up to 4.5mg . Will recheck 1 month. Call with any concerns.

## 2020-02-05 NOTE — Telephone Encounter (Signed)
erroneous error  

## 2020-02-05 NOTE — Progress Notes (Signed)
BP 116/75   Pulse 99   Temp 98 F (36.7 C) (Oral)   Wt 171 lb (77.6 kg)   SpO2 99%   BMI 27.03 kg/m    Subjective:    Patient ID: Alexis [name], male    DOB: 21-Jul-1986, 34 y.o.   MRN: [id number]  HPI: Alexis [name] is a 34 y.o. male  Chief Complaint  Patient presents with  . Diabetes   DIABETES- has been eating better, tolerating his metformin. Just went up to the 1.5  Of his trulicity on Monday Hypoglycemic episodes:no Polydipsia/polyuria: better Visual disturbance: getting better Chest pain: no Paresthesias: no Glucose Monitoring: yes  Accucheck frequency: occasionally  Fasting glucose: not checked  Post prandial: 280, 330 Taking Insulin?: no Blood Pressure Monitoring: not checking Retinal Examination: Up to Date Foot Exam: Up to Date Diabetic Education: Completed Pneumovax: Up to Date Influenza: postpone to flu season Aspirin: no  RASH- got a bunch of bug bites all over itching like crazy and making him very uncomfortable. Duration:  Couple of days  Location: generalized  Itching: yes Burning: no Redness: yes Oozing: no Scaling: no Blisters: no Painful: no Fevers: no Change in detergents/soaps/personal care products: no Recent illness: no Recent travel:no History of same: yes Context: worse Alleviating factors: nothing Treatments attempted:nothing Shortness of breath: no  Throat/tongue swelling: no Myalgias/arthralgias: no  Relevant past medical, surgical, family and social history reviewed and updated as indicated. Interim medical history since our last visit reviewed. Allergies and medications reviewed and updated.  Review of Systems  Constitutional: Negative.   Respiratory: Negative.   Cardiovascular: Negative.   Gastrointestinal: Positive for abdominal pain. Negative for abdominal distention, anal bleeding, blood in stool, constipation, diarrhea, nausea, rectal pain and vomiting.       + heartburn  Genitourinary:  Negative.   Musculoskeletal: Negative.   Skin: Positive for rash. Negative for color change, pallor and wound.  Psychiatric/Behavioral: Negative.     Per HPI unless specifically indicated above     Objective:    BP 116/75   Pulse 99   Temp 98 F (36.7 C) (Oral)   Wt 171 lb (77.6 kg)   SpO2 99%   BMI 27.03 kg/m   Wt Readings from Last 3 Encounters:  02/05/20 171 lb (77.6 kg)  12/24/19 172 lb 3.2 oz (78.1 kg)  03/20/19 182 lb (82.6 kg)    Physical Exam Vitals and nursing note reviewed.  Constitutional:      General: He is not in acute distress.    Appearance: Normal appearance. He is not ill-appearing, toxic-appearing or diaphoretic.  HENT:     Head: Normocephalic and atraumatic.     Right Ear: External ear normal.     Left Ear: External ear normal.     Nose: Nose normal.     Mouth/Throat:     Mouth: Mucous membranes are moist.     Pharynx: Oropharynx is clear.  Eyes:     General: No scleral icterus.       Right eye: No discharge.        Left eye: No discharge.     Extraocular Movements: Extraocular movements intact.     Conjunctiva/sclera: Conjunctivae normal.     Pupils: Pupils are equal, round, and reactive to light.  Cardiovascular:     Rate and Rhythm: Normal rate and regular rhythm.     Pulses: Normal pulses.     Heart sounds: Normal heart sounds. No murmur heard.  No friction  rub. No gallop.   Pulmonary:     Effort: Pulmonary effort is normal. No respiratory distress.     Breath sounds: Normal breath sounds. No stridor. No wheezing, rhonchi or rales.  Chest:     Chest wall: No tenderness.  Musculoskeletal:        General: Normal range of motion.     Cervical back: Normal range of motion and neck supple.  Skin:    General: Skin is warm and dry.     Capillary Refill: Capillary refill takes less than 2 seconds.     Coloration: Skin is not jaundiced or pale.     Findings: Rash (bug bites on arms legs and belly) present. No bruising, erythema or lesion.    Neurological:     General: No focal deficit present.     Mental Status: He is alert and oriented to person, place, and time. Mental status is at baseline.  Psychiatric:        Mood and Affect: Mood normal.        Behavior: Behavior normal.        Thought Content: Thought content normal.        Judgment: Judgment normal.     Results for orders placed or performed in visit on 12/24/19  Bayer DCA Hb A1c Waived  Result Value Ref Range   HB A1C (BAYER DCA - WAIVED) 13.9 (H) <7.0 %  CBC with Differential/Platelet  Result Value Ref Range   WBC 7.5 3.4 - 10.8 x10E3/uL   RBC 5.42 4.14 - 5.80 x10E6/uL   Hemoglobin 15.2 13.0 - 17.7 g/dL   Hematocrit 86.5 78.4 - 51.0 %   MCV 84 79 - 97 fL   MCH 28.0 26.6 - 33.0 pg   MCHC 33.5 31 - 35 g/dL   RDW 69.6 29.5 - 28.4 %   Platelets 289 150 - 450 x10E3/uL   Neutrophils 58 Not Estab. %   Lymphs 32 Not Estab. %   Monocytes 8 Not Estab. %   Eos 1 Not Estab. %   Basos 1 Not Estab. %   Neutrophils Absolute 4.4 1 - 7 x10E3/uL   Lymphocytes Absolute 2.4 0 - 3 x10E3/uL   Monocytes Absolute 0.6 0 - 0 x10E3/uL   EOS (ABSOLUTE) 0.0 0.0 - 0.4 x10E3/uL   Basophils Absolute 0.0 0 - 0 x10E3/uL   Immature Granulocytes 0 Not Estab. %   Immature Grans (Abs) 0.0 0.0 - 0.1 x10E3/uL  Comprehensive metabolic panel  Result Value Ref Range   Glucose 405 (H) 65 - 99 mg/dL   BUN 16 6 - 20 mg/dL   Creatinine, Ser 1.32 0.76 - 1.27 mg/dL   GFR calc non Af Amer 105 >59 mL/min/1.73   GFR calc Af Amer 122 >59 mL/min/1.73   BUN/Creatinine Ratio 17 9 - 20   Sodium 134 134 - 144 mmol/L   Potassium 4.6 3.5 - 5.2 mmol/L   Chloride 96 96 - 106 mmol/L   CO2 24 20 - 29 mmol/L   Calcium 9.3 8.7 - 10.2 mg/dL   Total Protein 6.8 6.0 - 8.5 g/dL   Albumin 4.4 4.0 - 5.0 g/dL   Globulin, Total 2.4 1.5 - 4.5 g/dL   Albumin/Globulin Ratio 1.8 1.2 - 2.2   Bilirubin Total 0.3 0.0 - 1.2 mg/dL   Alkaline Phosphatase 115 39 - 117 IU/L   AST 14 0 - 40 IU/L   ALT <5 0 - 44 IU/L   Lipid Panel w/o Chol/HDL Ratio  Result  Value Ref Range   Cholesterol, Total 158 100 - 199 mg/dL   Triglycerides 329 (H) 0 - 149 mg/dL   HDL 45 >51 mg/dL   VLDL Cholesterol Cal 34 5 - 40 mg/dL   LDL Chol Calc (NIH) 79 0 - 99 mg/dL  Microalbumin, Urine Waived  Result Value Ref Range   Microalb, Ur Waived 10 0 - 19 mg/L   Creatinine, Urine Waived 50 10 - 300 mg/dL   Microalb/Creat Ratio 30-300 (H) <30 mg/g  TSH  Result Value Ref Range   TSH 0.585 0.450 - 4.500 uIU/mL  UA/M w/rflx Culture, Routine   Specimen: Blood   BLD  Result Value Ref Range   Specific Gravity, UA 1.015 1.005 - 1.030   pH, UA 5.0 5.0 - 7.5   Color, UA Yellow Yellow   Appearance Ur Clear Clear   Leukocytes,UA Negative Negative   Protein,UA Negative Negative/Trace   Glucose, UA 3+ (A) Negative   Ketones, UA Trace (A) Negative   RBC, UA Negative Negative   Bilirubin, UA Negative Negative   Urobilinogen, Ur 0.2 0.2 - 1.0 mg/dL   Nitrite, UA Negative Negative      Assessment & Plan:   Problem List Items Addressed This Visit      Endocrine   Uncontrolled type 2 diabetes mellitus (HCC) - Primary    Sugars still running high. Will continue 1.5mg  of the trulicity and the metformin. Will likely need to go up to 4.5mg . Will recheck 1 month. Call with any concerns.       Relevant Medications   Dulaglutide (TRULICITY) 1.5 MG/0.5ML SOPN   Dulaglutide (TRULICITY) 0.75 MG/0.5ML SOPN   Other Relevant Orders   Comprehensive metabolic panel   Lipid Panel w/o Chol/HDL Ratio    Other Visit Diagnoses    Insect bite, unspecified site, initial encounter       Will treat with triamcinalone shot and cream. Call if not improving or worsening.    Relevant Medications   triamcinolone acetonide (KENALOG-40) injection 40 mg (Start on 02/05/2020  4:15 PM)       Follow up plan: Return in about 4 weeks (around 03/04/2020).

## 2020-02-06 LAB — COMPREHENSIVE METABOLIC PANEL
ALT: 2 IU/L (ref 0–44)
AST: 14 IU/L (ref 0–40)
Albumin/Globulin Ratio: 2.2 (ref 1.2–2.2)
Albumin: 4.4 g/dL (ref 4.0–5.0)
Alkaline Phosphatase: 108 IU/L (ref 48–121)
BUN/Creatinine Ratio: 18 (ref 9–20)
BUN: 14 mg/dL (ref 6–20)
Bilirubin Total: 0.3 mg/dL (ref 0.0–1.2)
CO2: 25 mmol/L (ref 20–29)
Calcium: 9.2 mg/dL (ref 8.7–10.2)
Chloride: 102 mmol/L (ref 96–106)
Creatinine, Ser: 0.8 mg/dL (ref 0.76–1.27)
GFR calc Af Amer: 133 mL/min/{1.73_m2} (ref >59)
GFR calc non Af Amer: 115 mL/min/{1.73_m2} (ref >59)
Globulin, Total: 2 g/dL (ref 1.5–4.5)
Glucose: 252 mg/dL — ABNORMAL HIGH (ref 65–99)
Potassium: 4.2 mmol/L (ref 3.5–5.2)
Sodium: 139 mmol/L (ref 134–144)
Total Protein: 6.4 g/dL (ref 6.0–8.5)

## 2020-02-06 LAB — LIPID PANEL W/O CHOL/HDL RATIO
Cholesterol, Total: 89 mg/dL — ABNORMAL LOW (ref 100–199)
HDL: 41 mg/dL (ref >39)
LDL Chol Calc (NIH): 26 mg/dL (ref 0–99)
Triglycerides: 126 mg/dL (ref 0–149)
VLDL Cholesterol Cal: 22 mg/dL (ref 5–40)

## 2020-02-08 ENCOUNTER — Encounter: Payer: Self-pay | Admitting: Family Medicine

## 2020-02-12 ENCOUNTER — Ambulatory Visit: Payer: Self-pay | Admitting: General Practice

## 2020-02-12 ENCOUNTER — Telehealth: Payer: 59

## 2020-02-12 NOTE — Chronic Care Management (AMB) (Signed)
°  Chronic Care Management   Outreach Note  02/12/2020 Name: Alexis [name] MRN: [id number] DOB: [birth year]  Referred by: Dorcas Carrow, DO Reason for referral : Care Coordination [facility] Initial Outreach for Chronic Disease Management and Care Coordination Needs)   An unsuccessful telephone outreach was attempted today. The patient was referred to the case management team for assistance with care management and care coordination.   Follow Up Plan: The care management team will reach out to the patient again over the next 30 to 60 days.   [name] RN, MSN, CCM Community Care Coordinator [place]   [facility] Mobile: [phone]

## 2020-03-23 ENCOUNTER — Telehealth: Payer: Self-pay | Admitting: General Practice

## 2020-03-23 ENCOUNTER — Telehealth: Payer: Self-pay | Admitting: *Deleted

## 2020-03-23 ENCOUNTER — Telehealth: Payer: Self-pay

## 2020-03-23 ENCOUNTER — Telehealth: Payer: 59

## 2020-03-23 NOTE — Chronic Care Management (AMB) (Signed)
  Care Management   Note  03/23/2020 Name: Alexis [name] MRN: [id number] DOB: 26-Jul-1986  [name] is a 34 y.o. year old male who is a primary care patient of Dorcas Carrow, DO and is actively engaged with the care management team. I reached out to [name] by phone today to assist with re-scheduling a follow up visit with the Pharmacist.  Follow up plan: Unsuccessful telephone outreach attempt made. The care management team will reach out to the patient again over the next 7 days. If patient returns call to provider office, please advise to call Embedded Care Management Care Guide Gwenevere Ghazi at [phone].  Gwenevere Ghazi  Care Guide, Embedded Care Coordination [facility]  [place], [place] [address] Direct Dial: [phone] [email or web address]

## 2020-03-23 NOTE — Telephone Encounter (Signed)
°  Chronic Care Management   Outreach Note  03/23/2020 Name: Alexis [name] MRN: [id number] DOB: 1985/12/06  Referred by: Dorcas Carrow, DO Reason for referral : Appointment (RNCM: 2nd attempt at Initial outreach for Chronic Disease managment and care Coordination needs)   A second unsuccessful telephone outreach was attempted today. The patient was referred to the case management team for assistance with care management and care coordination.   Follow Up Plan: The care management team will reach out to the patient again over the next 30 to 60 days.   [name] RN, MSN, CCM Community Care Coordinator [place]   [facility] Mobile: [phone]

## 2020-03-29 ENCOUNTER — Telehealth: Payer: Self-pay

## 2020-03-29 NOTE — Chronic Care Management (AMB) (Signed)
  Care Management   Note  03/29/2020 Name: [name] MRN: [id number] DOB: 17-Dec-1985  Alexis [name] is a 34 y.o. year old male who is a primary care patient of Dorcas Carrow, DO and is actively engaged with the care management team. I reached out to Alexis [name] by phone today to assist with re-scheduling an initial visit with the RN Case Manager  Follow up plan: Unsuccessful telephone outreach attempt made. The care management team will reach out to the patient again over the next 7 days.  If patient returns call to provider office, please advise to call Embedded Care Management Care Guide Penne Lash  at [phone]  Penne Lash, RMA Care Guide, Embedded Care Coordination [facility]  [place], [place] [address] Direct Dial: [phone] [email or web address]

## 2020-04-04 NOTE — Chronic Care Management (AMB) (Signed)
  Care Management   Note  04/04/2020 Name: Alexis [name] MRN: [id number] DOB: 1985-11-19  Alexis [name] is a 34 y.o. year old male who is a primary care patient of Dorcas Carrow, DO and is actively engaged with the care management team. I reached out to Alexis [name] by phone today to assist with re-scheduling an initial visit with the RN Case Manager.  Follow up plan: Telephone appointment with care management team member scheduled for:05/20/2020  [facility] Guide, Embedded Care Coordination [facility]  [place], [place] [address] Direct Dial: [phone] [email or web address]: [email or web address]

## 2020-04-04 NOTE — Chronic Care Management (AMB) (Signed)
  Care Management   Note  04/04/2020 Name: [name] MRN: [id number] DOB: 07/29/1986  Alexis [name] is a 34 y.o. year old male who is a primary care patient of Dorcas Carrow, DO and is actively engaged with the care management team. I reached out to Alexis [name] by phone today to assist with re-scheduling a follow up visit with the Pharmacist.  Follow up plan: Telephone appointment with care management team member scheduled for: 04/25/2020  [facility] Guide, Embedded Care Coordination [facility]  [place], [place] [address] Direct Dial: [phone] [email or web address]: [email or web address]

## 2020-04-05 ENCOUNTER — Telehealth: Payer: Self-pay

## 2020-04-06 NOTE — Telephone Encounter (Signed)
Pt has been r/s for 05/20/2020

## 2020-04-25 ENCOUNTER — Telehealth: Payer: 59

## 2020-04-26 ENCOUNTER — Telehealth: Payer: Self-pay | Admitting: Pharmacist

## 2020-04-26 NOTE — Chronic Care Management (AMB) (Signed)
    Chronic Care Management Pharmacy Assistant   Name: Alexis [name]  MRN: [id number] DOB: 1986/06/30  Reason for Encounter: Attempted Hypertension Management Call.     PCP : Dorcas Carrow, DO  Allergies:  No Known Allergies  Medications: Outpatient Encounter Medications as of 04/26/2020  Medication Sig  . atorvastatin (LIPITOR) 40 MG tablet Take 1 tablet (40 mg total) by mouth daily.  . Dulaglutide (TRULICITY) 0.75 MG/0.5ML SOPN Inject 1 mL (1.5 mg total) into the skin once a week.  . Dulaglutide (TRULICITY) 1.5 MG/0.5ML SOPN Inject 1.5 mg into the skin once a week.  [name] lisinopril (ZESTRIL) 5 MG tablet Take 1 tablet (5 mg total) by mouth daily.  . metFORMIN (GLUCOPHAGE-XR) 500 MG 24 hr tablet Take 2 tablets (1,000 mg total) by mouth in the morning and at bedtime.  . pantoprazole (PROTONIX) 40 MG tablet Take 1 tablet (40 mg total) by mouth daily.  . QUEtiapine (SEROQUEL) 25 MG tablet Take 1 tablet (25 mg total) by mouth at bedtime. (Patient not taking: Reported on 12/24/2019)  . triamcinolone ointment (KENALOG) 0.5 % Apply 1 application topically 2 (two) times daily.   No facility-administered encounter medications on file as of 04/26/2020.    Current Diagnosis: Patient Active Problem List   Diagnosis Date Noted  . Bipolar 2 disorder (HCC) 02/10/2018  . Uncontrolled type 2 diabetes mellitus (HCC) 01/20/2016    Recent Relevant Labs: Lab Results  Component Value Date/Time   HGBA1C 13.9 (H) 12/24/2019 02:50 PM   HGBA1C 9.5 (H) 08/26/2019 04:13 PM   HGBA1C 6.1 03/08/2017 12:00 AM   HGBA1C 6.1 07/27/2016 12:00 AM   MICROALBUR 10 12/24/2019 02:50 PM   MICROALBUR 10 08/26/2019 04:13 PM    Kidney Function Lab Results  Component Value Date/Time   CREATININE 0.80 02/05/2020 04:09 PM   CREATININE 0.93 12/24/2019 03:23 PM   GFRNONAA 115 02/05/2020 04:09 PM   GFRAA 133 02/05/2020 04:09 PM   04/26/2020: Three attempts to contact patient to discuss hypertension  management. No answer. Left HIPPA compliant voicemail requesting a call back.   Suezanne Cheshire, CMA Clinical Pharmicist Assistant [phone]   Follow-Up:  Pharmacist Review

## 2020-05-02 ENCOUNTER — Telehealth: Payer: Self-pay | Admitting: Pharmacist

## 2020-05-02 NOTE — Progress Notes (Signed)
Unable to leave message to schedule a follow up appointment due to phone inbox not being set up .      Aloha Gell ,[facility] Clinical Pharmacist Assistant [phone]

## 2020-05-02 NOTE — Progress Notes (Signed)
  Chronic Care Management   Outreach Note  05/02/2020 Name: Alexis [name] MRN: [id number] DOB: [birth year]  Referred by: Dorcas Carrow, DO Reason for referral : Chronic Care Management   An unsuccessful telephone outreach was attempted today. The patient was referred to the pharmacist for assistance with care management and care coordination.   Follow Up Plan: Will ask CPA to reach out to patient for reschedule with PharmD for continued medication management needs.  Mercer Pod. Tiburcio Pea PharmD, BCPS Clinical Pharmacist [facility] [phone]

## 2020-05-20 ENCOUNTER — Telehealth: Payer: 59 | Admitting: General Practice

## 2020-05-20 ENCOUNTER — Ambulatory Visit: Payer: Self-pay | Admitting: General Practice

## 2020-05-20 DIAGNOSIS — E1165 Type 2 diabetes mellitus with hyperglycemia: Secondary | ICD-10-CM

## 2020-05-20 NOTE — Patient Instructions (Signed)
Visit Information  Goals Addressed              This Visit's Progress     RNCM: Monitor and Manage My Blood Sugar        Follow Up Date 07-06-2020   - check blood sugar at prescribed times - check blood sugar if I feel it is too high or too low - take the blood sugar log to all doctor visits    Why is this important?   Checking your blood sugar at home helps to keep it from getting very high or very low.  Writing the results in a diary or log helps the doctor know how to care for you.  Your blood sugar log should have the time, date and the results.  Also, write down the amount of insulin or other medicine that you take.  Other information, like what you ate, exercise done and how you were feeling, will also be helpful.     Notes: Currently the patient is not taking blood sugars regularly. Last blood sugar taken last week was 250 per the patient.       RNCM: Pt-"I eat a lot of fast food" (pt-stated)        CARE PLAN ENTRY (see longtitudinal plan of care for additional care plan information)  Objective:  Lab Results  Component Value Date   HGBA1C 13.9 (H) 12/24/2019    Lab Results  Component Value Date   CREATININE 0.80 02/05/2020   CREATININE 0.93 12/24/2019   CREATININE 0.85 08/26/2019     No results found for: EGFR  Current Barriers:   Knowledge Deficits related to basic Diabetes pathophysiology and self care/management  Knowledge Deficits related to medications used for management of diabetes  Does not use cbg meter   Not taking Trulicity as prescribed  Not following up with pcp per recommendations  Case Manager Clinical Goal(s):  Over the next 120 days, patient will demonstrate improved adherence to prescribed treatment plan for diabetes self care/management as evidenced by:   daily monitoring and recording of CBG   adherence to ADA/ carb modified diet  exercise 3/4 days/week  adherence to prescribed medication regimen  Interventions:    Provided education to patient about basic DM disease process  Reviewed medications with patient and discussed importance of medication adherence.  The patient verbalized the Trulicity causes him to be dizzy and he stopped it because with his job he could not work and have dizzy spells.   Discussed plans with patient for ongoing care management follow up and provided patient with direct contact information for care management team  Provided patient with written educational materials related to hypo and hyperglycemia and importance of correct treatment. The patient states that he does not have an episodes of hypoglycemia. Does have to pee a lot. States he has a big appetite also.   Reviewed scheduled/upcoming provider appointments including: the patient has no upcoming appointments. Encouraged getting an appointment with pcp to follow up with DM management and also to discuss the side effects of Trulicity and see what options for an alternate medication there are.  The patient has stopped taking.   Advised patient, providing education and rationale, to check cbg BID and record, calling pcp for findings outside established parameters. The patient is not checking his blood sugars regularly. The patient states that his girlfriend took it last week and it was 250. Education on the benefits of taking blood sugars as directed by the pcp.  Referral made to pharmacy team for assistance with will let the pharmacist know that the patient works til 4:30 daily, he takes his lunch break between calls.   Evaluation of dietary habits. The patient states he eats a lot of fast foods because of his job. Is not consistent with ADA diet.   Review of patient status, including review of consultants reports, relevant laboratory and other test results, and medications completed.  Patient Self Care Activities:   UNABLE to independently manage DM as evidence of hemoglobin A1C of 13.9% on 12-24-2019  Self administers  oral medications as prescribed  Self administers injectable DM medication (Tulicity) as prescribed  Attends all scheduled provider appointments  Checks blood sugars as prescribed and utilize hyper and hypoglycemia protocol as needed  Adheres to prescribed ADA/carb modified  Initial goal documentation       RNCM: Set My Target A1C        Follow Up Date 07/06/2020   - set target A1C    Why is this important?   Your target A1C is decided together by you and your doctor.  It is based on several things like your age and other health issues.    Notes: The patients last hemoglobin A1C was 13.9% on 12-24-2019       Mr. Colston was given information about Care Management services today including:  1. Care Management services include personalized support from designated clinical staff supervised by his physician, including individualized plan of care and coordination with other care providers 2. 24/7 contact phone numbers for assistance for urgent and routine care needs. 3. The patient may stop CCM services at any time (effective at the end of the month) by phone call to the office staff.  Patient agreed to services and verbal consent obtained.   Patient verbalizes understanding of instructions provided today.   Telephone follow up appointment with care management team member scheduled for: 07-06-2020 at 4:15 pm  [place], MSN, [facility] Mobile: [phone]

## 2020-05-20 NOTE — Chronic Care Management (AMB) (Signed)
Care Management   Initial Visit Note  05/20/2020 Name: Alexis [name] MRN: [id number] DOB: [birth year]  Subjective:   Objective:  Assessment: Alexis [name] III is a 34 y.o. year old male who sees Valerie Roys, DO for primary care. The care management team was consulted for assistance with care management and care coordination needs related to Disease Management Educational Needs Care Coordination Medication Management and Education.   Review of patient status, including review of consultants reports, relevant laboratory and other test results, and collaboration with appropriate care team members and the patient's provider was performed as part of comprehensive patient evaluation and provision of care management services.    SDOH (Social Determinants of Health) assessments performed: Yes See Care Plan activities for detailed interventions related to [facility])     Outpatient Encounter Medications as of 05/20/2020  Medication Sig  . atorvastatin (LIPITOR) 40 MG tablet Take 1 tablet (40 mg total) by mouth daily.  . Dulaglutide (TRULICITY) 0.10 UV/2.5DG SOPN Inject 1 mL (1.5 mg total) into the skin once a week.  . Dulaglutide (TRULICITY) 1.5 UY/4.0HK SOPN Inject 1.5 mg into the skin once a week.  [name] lisinopril (ZESTRIL) 5 MG tablet Take 1 tablet (5 mg total) by mouth daily.  . metFORMIN (GLUCOPHAGE-XR) 500 MG 24 hr tablet Take 2 tablets (1,000 mg total) by mouth in the morning and at bedtime.  . pantoprazole (PROTONIX) 40 MG tablet Take 1 tablet (40 mg total) by mouth daily.  . QUEtiapine (SEROQUEL) 25 MG tablet Take 1 tablet (25 mg total) by mouth at bedtime. (Patient not taking: Reported on 12/24/2019)  . triamcinolone ointment (KENALOG) 0.5 % Apply 1 application topically 2 (two) times daily.   No facility-administered encounter medications on file as of 05/20/2020.    Goals Addressed              This Visit's Progress   .  RNCM: Monitor and Manage My Blood Sugar         Follow Up Date 07-06-2020   - check blood sugar at prescribed times - check blood sugar if I feel it is too high or too low - take the blood sugar log to all doctor visits    Why is this important?   Checking your blood sugar at home helps to keep it from getting very high or very low.  Writing the results in a diary or log helps the doctor know how to care for you.  Your blood sugar log should have the time, date and the results.  Also, write down the amount of insulin or other medicine that you take.  Other information, like what you ate, exercise done and how you were feeling, will also be helpful.     Notes: Currently the patient is not taking blood sugars regularly. Last blood sugar taken last week was 250 per the patient.     [name]  RNCM: Pt-"I eat a lot of fast food" (pt-stated)        CARE PLAN ENTRY (see longtitudinal plan of care for additional care plan information)  Objective:  Lab Results  Component Value Date   HGBA1C 13.9 (H) 12/24/2019 .   Lab Results  Component Value Date   CREATININE 0.80 02/05/2020   CREATININE 0.93 12/24/2019   CREATININE 0.85 08/26/2019 .   [name] No results found for: EGFR  Current Barriers:  [name] Knowledge Deficits related to basic Diabetes pathophysiology and self care/management . Knowledge Deficits related to medications used for  management of diabetes . Does not use cbg meter  . Not taking Trulicity as prescribed . Not following up with pcp per recommendations  Case Manager Clinical Goal(s):  Over the next 120 days, patient will demonstrate improved adherence to prescribed treatment plan for diabetes self care/management as evidenced by:  . daily monitoring and recording of CBG  . adherence to ADA/ carb modified diet . exercise 3/4 days/week . adherence to prescribed medication regimen  Interventions:  . Provided education to patient about basic DM disease process . Reviewed medications with patient and discussed importance of medication  adherence.  The patient verbalized the Trulicity causes him to be dizzy and he stopped it because with his job he could not work and have dizzy spells.  . Discussed plans with patient for ongoing care management follow up and provided patient with direct contact information for care management team . Provided patient with written educational materials related to hypo and hyperglycemia and importance of correct treatment. The patient states that he does not have an episodes of hypoglycemia. Does have to pee a lot. States he has a big appetite also.  . Reviewed scheduled/upcoming provider appointments including: the patient has no upcoming appointments. Encouraged getting an appointment with pcp to follow up with DM management and also to discuss the side effects of Trulicity and see what options for an alternate medication there are.  The patient has stopped taking.  . Advised patient, providing education and rationale, to check cbg BID and record, calling pcp for findings outside established parameters. The patient is not checking his blood sugars regularly. The patient states that his girlfriend took it last week and it was 250. Education on the benefits of taking blood sugars as directed by the pcp.   [name] Referral made to pharmacy team for assistance with will let the pharmacist know that the patient works til 4:30 daily, he takes his lunch break between calls.  . Evaluation of dietary habits. The patient states he eats a lot of fast foods because of his job. Is not consistent with ADA diet.  . Review of patient status, including review of consultants reports, relevant laboratory and other test results, and medications completed.  Patient Self Care Activities:  . UNABLE to independently manage DM as evidence of hemoglobin A1C of 13.9% on 12-24-2019 . Self administers oral medications as prescribed . Self administers injectable DM medication (Tulicity) as prescribed . Attends all scheduled provider  appointments . Checks blood sugars as prescribed and utilize hyper and hypoglycemia protocol as needed . Adheres to prescribed ADA/carb modified  Initial goal documentation     .  RNCM: Set My Target A1C        Follow Up Date 07/06/2020   - set target A1C    Why is this important?   Your target A1C is decided together by you and your doctor.  It is based on several things like your age and other health issues.    Notes: The patients last hemoglobin A1C was 13.9% on 12-24-2019        Follow up plan:  Telephone follow up appointment with care management team member scheduled for: 07-06-2020 at 4:15 pm  Mr. Morton was given information about Care Management services today including:  1. Care Management services include personalized support from designated clinical staff supervised by a physician, including individualized plan of care and coordination with other care providers 2. 24/7 contact phone numbers for assistance for urgent and routine care needs. 3. The  patient may stop Care Management services at any time (effective at the end of the month) by phone call to the office staff.  Patient agreed to services and verbal consent obtained.  Noreene Larsson RN, MSN, [facility] Mobile: [phone]

## 2020-07-06 ENCOUNTER — Telehealth: Payer: Self-pay

## 2020-07-15 ENCOUNTER — Telehealth: Payer: Self-pay | Admitting: General Practice

## 2020-07-15 ENCOUNTER — Ambulatory Visit: Payer: Self-pay | Admitting: General Practice

## 2020-07-15 DIAGNOSIS — F3181 Bipolar II disorder: Secondary | ICD-10-CM

## 2020-07-15 DIAGNOSIS — E1165 Type 2 diabetes mellitus with hyperglycemia: Secondary | ICD-10-CM

## 2020-07-15 NOTE — Chronic Care Management (AMB) (Signed)
Care Management   Follow Up Note   07/15/2020 Name: Alexis [name] MRN: [id number] DOB: [birth year]  Alexis [name] is enrolled in a Managed Medicaid plan: No. Outreach attempt today was successful.    Referred by: Valerie Roys, DO Reason for referral : Care Coordination (RNCM Follow up call for Chronic Disease Managment and Care Coordination Needs)   Alexis [name] is a 34 y.o. year old male who is a primary care patient of Valerie Roys, DO. The care management team was consulted for assistance with care management and care coordination needs.    Review of patient status, including review of consultants reports, relevant laboratory and other test results, and collaboration with appropriate care team members and the patient's provider was performed as part of comprehensive patient evaluation and provision of chronic care management services.    Goals Addressed              This Visit's Progress   .  RNCM: Monitor and Manage My Blood Sugar           - check blood sugar at prescribed times - check blood sugar if I feel it is too high or too low - take the blood sugar log to all doctor visits       Notes: Currently the patient is not taking blood sugars regularly. Last blood sugar taken last week was 250 per the patient. 07-15-2020: The patient states his blood sugar was 200- 2 weeks ago.    [name]  RNCM: Pt-"I eat a lot of fast food" (pt-stated)        CARE PLAN ENTRY (see longtitudinal plan of care for additional care plan information)  Objective:  Lab Results  Component Value Date   HGBA1C 13.9 (H) 12/24/2019 .   Lab Results  Component Value Date   CREATININE 0.80 02/05/2020   CREATININE 0.93 12/24/2019   CREATININE 0.85 08/26/2019 .   [name] No results found for: EGFR  Current Barriers:  [name] Knowledge Deficits related to basic Diabetes pathophysiology and self care/management . Knowledge Deficits related to medications used for management of  diabetes . Does not use cbg meter  . Not taking Trulicity as prescribed . Not following up with pcp per recommendations  Case Manager Clinical Goal(s):  Over the next 120 days, patient will demonstrate improved adherence to prescribed treatment plan for diabetes self care/management as evidenced by:  . daily monitoring and recording of CBG  . adherence to ADA/ carb modified diet . exercise 3/4 days/week . adherence to prescribed medication regimen  Interventions:  . Provided education to patient about basic DM disease process . Reviewed medications with patient and discussed importance of medication adherence.  The patient verbalized the Trulicity causes him to be dizzy and he stopped it because with his job he could not work and have dizzy spells. 07-15-2020: The patient states he is taking his medications as directed but did not talk with the RNCM long. Agrees he needs to come in to see the pcp for follow up.  . Discussed plans with patient for ongoing care management follow up and provided patient with direct contact information for care management team . Provided patient with written educational materials related to hypo and hyperglycemia and importance of correct treatment. The patient states that he does not have an episodes of hypoglycemia. Does have to pee a lot. States he has a big appetite also. 07-15-2020: The patient states that he is always "hungry" education  on this being a sign of high blood sugars. Education on getting blood sugars under control.  . Reviewed scheduled/upcoming provider appointments including: the patient has no upcoming appointments. Encouraged getting an appointment with pcp to follow up with DM management and also to discuss the side effects of Trulicity and see what options for an alternate medication there are.  The patient has stopped taking. 07-15-2020: The patient has not seen the provider for follow up and recheck of blood sugars. The patient agrees to get an  appointment to come in and see the pcp.  . Advised patient, providing education and rationale, to check cbg BID and record, calling pcp for findings outside established parameters. The patient is not checking his blood sugars regularly. The patient states that his girlfriend took it last week and it was 200.  He states she tries to check it more often but he just doesn't do it.  Contracted with the patient to check his blood sugars at least three times a week. The patient states he will try to do this. Education on fasting blood sugar of <130 or less and 2 hours after eating <180 or less. . Education on the benefits of taking blood sugars as directed by the pcp.   [name] Referral made to pharmacy team for assistance with will let the pharmacist know that the patient works til 4:30 daily, he takes his lunch break between calls.  . Evaluation of dietary habits. The patient states he eats a lot of fast foods because of his job. Is not consistent with ADA diet.  . Review of patient status, including review of consultants reports, relevant laboratory and other test results, and medications completed. . Collaboration with front office staff to get an appointment for the patient to come in to be seen by Dr. Wynetta Emery. Also will give educational material to the patients girlfriend along with log book. Will continue to work with the patient in managing his health and well being.   Patient Self Care Activities:  . UNABLE to independently manage DM as evidence of hemoglobin A1C of 13.9% on 12-24-2019 . Self administers oral medications as prescribed . Self administers injectable DM medication (Tulicity) as prescribed . Attends all scheduled provider appointments . Checks blood sugars as prescribed and utilize hyper and hypoglycemia protocol as needed . Adheres to prescribed ADA/carb modified  Please see past updates related to this goal by clicking on the "Past Updates" button in the selected goal           Telephone follow up appointment with care management team member scheduled for: 09-02-2020 at 4:15 pm  [place], MSN, [facility] Mobile: [phone]

## 2020-07-15 NOTE — Patient Instructions (Signed)
Visit Information  Goals Addressed              This Visit's Progress   .  RNCM: Monitor and Manage My Blood Sugar           - check blood sugar at prescribed times - check blood sugar if I feel it is too high or too low - take the blood sugar log to all doctor visits       Notes: Currently the patient is not taking blood sugars regularly. Last blood sugar taken last week was 250 per the patient. 07-15-2020: The patient states his blood sugar was 200- 2 weeks ago.    [name]  RNCM: Pt-"I eat a lot of fast food" (pt-stated)        CARE PLAN ENTRY (see longtitudinal plan of care for additional care plan information)  Objective:  Lab Results  Component Value Date   HGBA1C 13.9 (H) 12/24/2019 .   Lab Results  Component Value Date   CREATININE 0.80 02/05/2020   CREATININE 0.93 12/24/2019   CREATININE 0.85 08/26/2019 .   [name] No results found for: EGFR  Current Barriers:  [name] Knowledge Deficits related to basic Diabetes pathophysiology and self care/management . Knowledge Deficits related to medications used for management of diabetes . Does not use cbg meter  . Not taking Trulicity as prescribed . Not following up with pcp per recommendations  Case Manager Clinical Goal(s):  Over the next 120 days, patient will demonstrate improved adherence to prescribed treatment plan for diabetes self care/management as evidenced by:  . daily monitoring and recording of CBG  . adherence to ADA/ carb modified diet . exercise 3/4 days/week . adherence to prescribed medication regimen  Interventions:  . Provided education to patient about basic DM disease process . Reviewed medications with patient and discussed importance of medication adherence.  The patient verbalized the Trulicity causes him to be dizzy and he stopped it because with his job he could not work and have dizzy spells. 07-15-2020: The patient states he is taking his medications as directed but did not talk with the RNCM long.  Agrees he needs to come in to see the pcp for follow up.  . Discussed plans with patient for ongoing care management follow up and provided patient with direct contact information for care management team . Provided patient with written educational materials related to hypo and hyperglycemia and importance of correct treatment. The patient states that he does not have an episodes of hypoglycemia. Does have to pee a lot. States he has a big appetite also. 07-15-2020: The patient states that he is always "hungry" education on this being a sign of high blood sugars. Education on getting blood sugars under control.  . Reviewed scheduled/upcoming provider appointments including: the patient has no upcoming appointments. Encouraged getting an appointment with pcp to follow up with DM management and also to discuss the side effects of Trulicity and see what options for an alternate medication there are.  The patient has stopped taking. 07-15-2020: The patient has not seen the provider for follow up and recheck of blood sugars. The patient agrees to get an appointment to come in and see the pcp.  . Advised patient, providing education and rationale, to check cbg BID and record, calling pcp for findings outside established parameters. The patient is not checking his blood sugars regularly. The patient states that his girlfriend took it last week and it was 200.  He states she tries to check  it more often but he just doesn't do it.  Contracted with the patient to check his blood sugars at least three times a week. The patient states he will try to do this. Education on fasting blood sugar of <130 or less and 2 hours after eating <180 or less. . Education on the benefits of taking blood sugars as directed by the pcp.   [name] Referral made to pharmacy team for assistance with will let the pharmacist know that the patient works til 4:30 daily, he takes his lunch break between calls.  . Evaluation of dietary habits. The patient  states he eats a lot of fast foods because of his job. Is not consistent with ADA diet.  . Review of patient status, including review of consultants reports, relevant laboratory and other test results, and medications completed. . Collaboration with front office staff to get an appointment for the patient to come in to be seen by Dr. Wynetta Emery. Also will give educational material to the patients girlfriend along with log book. Will continue to work with the patient in managing his health and well being.   Patient Self Care Activities:  . UNABLE to independently manage DM as evidence of hemoglobin A1C of 13.9% on 12-24-2019 . Self administers oral medications as prescribed . Self administers injectable DM medication (Tulicity) as prescribed . Attends all scheduled provider appointments . Checks blood sugars as prescribed and utilize hyper and hypoglycemia protocol as needed . Adheres to prescribed ADA/carb modified  Please see past updates related to this goal by clicking on the "Past Updates" button in the selected goal         The patient verbalized understanding of instructions, educational materials, and care plan provided today and declined offer to receive copy of patient instructions, educational materials, and care plan.   Telephone follow up appointment with care management team member scheduled for: 09-02-2020 at 4:15 pm  [place], MSN, [facility] Mobile: [phone]

## 2020-07-18 NOTE — Progress Notes (Signed)
Unable to lvm due to voicemail not set up.

## 2020-07-19 NOTE — Progress Notes (Signed)
Pt schedule for 08/15/2020 due to work schedule he wanted apt after new year. Pt verbalized understanding.

## 2020-08-15 ENCOUNTER — Encounter: Payer: Self-pay | Admitting: Family Medicine

## 2020-08-15 ENCOUNTER — Ambulatory Visit (INDEPENDENT_AMBULATORY_CARE_PROVIDER_SITE_OTHER): Payer: 59 | Admitting: Family Medicine

## 2020-08-15 ENCOUNTER — Telehealth: Payer: Self-pay | Admitting: *Deleted

## 2020-08-15 ENCOUNTER — Other Ambulatory Visit: Payer: Self-pay

## 2020-08-15 VITALS — BP 127/83 | HR 87 | Temp 97.6°F | Wt 167.6 lb

## 2020-08-15 DIAGNOSIS — F3181 Bipolar II disorder: Secondary | ICD-10-CM | POA: Diagnosis not present

## 2020-08-15 DIAGNOSIS — Z23 Encounter for immunization: Secondary | ICD-10-CM

## 2020-08-15 DIAGNOSIS — E1165 Type 2 diabetes mellitus with hyperglycemia: Secondary | ICD-10-CM | POA: Diagnosis not present

## 2020-08-15 LAB — URINALYSIS, ROUTINE W REFLEX MICROSCOPIC
Bilirubin, UA: NEGATIVE
Glucose, UA: NEGATIVE
Leukocytes,UA: NEGATIVE
Nitrite, UA: NEGATIVE
Protein,UA: NEGATIVE
RBC, UA: NEGATIVE
Specific Gravity, UA: 1.015 (ref 1.005–1.030)
Urobilinogen, Ur: 0.2 mg/dL (ref 0.2–1.0)
pH, UA: 5.5 (ref 5.0–7.5)

## 2020-08-15 LAB — MICROALBUMIN, URINE WAIVED
Creatinine, Urine Waived: 50 mg/dL (ref 10–300)
Microalb, Ur Waived: 10 mg/L (ref 0–19)
Microalb/Creat Ratio: <30 mg/g (ref <30)

## 2020-08-15 LAB — BAYER DCA HB A1C WAIVED: HB A1C (BAYER DCA - WAIVED): 13.9 % — ABNORMAL HIGH (ref <7.0)

## 2020-08-15 MED ORDER — TRULICITY 0.75 MG/0.5ML ~~LOC~~ SOAJ: 0.7500 mg | SUBCUTANEOUS | 1 refills | Status: AC

## 2020-08-15 MED ORDER — METFORMIN HCL ER 500 MG PO TB24: 1000.0000 mg | ORAL_TABLET | Freq: Two times a day (BID) | ORAL | 0 refills | Status: DC

## 2020-08-15 MED ORDER — TRULICITY 0.75 MG/0.5ML ~~LOC~~ SOAJ: 0.7500 mg | SUBCUTANEOUS | 1 refills | Status: DC

## 2020-08-15 MED ORDER — PANTOPRAZOLE SODIUM 40 MG PO TBEC: 40.0000 mg | DELAYED_RELEASE_TABLET | Freq: Every day | ORAL | 0 refills | Status: DC

## 2020-08-15 MED ORDER — ATORVASTATIN CALCIUM 40 MG PO TABS
40.0000 mg | ORAL_TABLET | Freq: Every day | ORAL | 0 refills | Status: DC
Start: 2020-08-15 — End: 2020-12-09

## 2020-08-15 NOTE — Progress Notes (Signed)
BP 127/83   Pulse 87   Temp 97.6 F (36.4 C)   Wt 167 lb 9.6 oz (76 kg)   SpO2 99%   BMI 26.49 kg/m    Subjective:    Patient ID: Alexis [name], male    DOB: 10/24/1985, 35 y.o.   MRN: [id number]  HPI: Alexis [name] is a 35 y.o. male  Chief Complaint  Patient presents with  . Diabetes   DIABETES Hypoglycemic episodes:no Polydipsia/polyuria: yes Visual disturbance: no Chest pain: no Paresthesias: no Glucose Monitoring: yes  Accucheck frequency: Not Checking Taking Insulin?: no Blood Pressure Monitoring: not checking Retinal Examination: Not up to Date Foot Exam: Done today Diabetic Education: Completed Pneumovax: Up to Date Influenza: Not up to Date Aspirin: no  HYPERTENSION / HYPERLIPIDEMIA- has not been taking his medicine for months Satisfied with current treatment? yes Duration of hypertension: chronic BP monitoring frequency: not checking BP medication side effects: not sure- was dizzy Past BP meds: lisinopril Duration of hyperlipidemia: chronic Cholesterol medication side effects: no Cholesterol supplements: none Past cholesterol medications: atorvastatin Medication compliance: poor compliance Aspirin: no Recent stressors: no Recurrent headaches: no Visual changes: no Palpitations: no Dyspnea: no Chest pain: no Lower extremity edema: no Dizzy/lightheaded: no  Relevant past medical, surgical, family and social history reviewed and updated as indicated. Interim medical history since our last visit reviewed. Allergies and medications reviewed and updated.  Review of Systems  Constitutional: Positive for fatigue. Negative for activity change, appetite change, chills, diaphoresis, fever and unexpected weight change.  HENT: Negative.   Respiratory: Negative.   Cardiovascular: Negative.   Gastrointestinal: Negative.   Endocrine: Positive for polydipsia, polyphagia and polyuria. Negative for cold intolerance and heat intolerance.   Musculoskeletal: Negative.   Psychiatric/Behavioral: Negative.     Per HPI unless specifically indicated above     Objective:    BP 127/83   Pulse 87   Temp 97.6 F (36.4 C)   Wt 167 lb 9.6 oz (76 kg)   SpO2 99%   BMI 26.49 kg/m   Wt Readings from Last 3 Encounters:  08/15/20 167 lb 9.6 oz (76 kg)  02/05/20 171 lb (77.6 kg)  12/24/19 172 lb 3.2 oz (78.1 kg)    Physical Exam Vitals and nursing note reviewed.  Constitutional:      General: He is not in acute distress.    Appearance: Normal appearance. He is not ill-appearing, toxic-appearing or diaphoretic.  HENT:     Head: Normocephalic and atraumatic.     Right Ear: External ear normal.     Left Ear: External ear normal.     Nose: Nose normal.     Mouth/Throat:     Mouth: Mucous membranes are moist.     Pharynx: Oropharynx is clear.  Eyes:     General: No scleral icterus.       Right eye: No discharge.        Left eye: No discharge.     Extraocular Movements: Extraocular movements intact.     Conjunctiva/sclera: Conjunctivae normal.     Pupils: Pupils are equal, round, and reactive to light.  Cardiovascular:     Rate and Rhythm: Normal rate and regular rhythm.     Pulses: Normal pulses.     Heart sounds: Normal heart sounds. No murmur heard. No friction rub. No gallop.   Pulmonary:     Effort: Pulmonary effort is normal. No respiratory distress.     Breath sounds: Normal breath sounds. No  stridor. No wheezing, rhonchi or rales.  Chest:     Chest wall: No tenderness.  Musculoskeletal:        General: Normal range of motion.     Cervical back: Normal range of motion and neck supple.  Skin:    General: Skin is warm and dry.     Capillary Refill: Capillary refill takes less than 2 seconds.     Coloration: Skin is not jaundiced or pale.     Findings: No bruising, erythema, lesion or rash.  Neurological:     General: No focal deficit present.     Mental Status: He is alert and oriented to person, place, and  time. Mental status is at baseline.  Psychiatric:        Mood and Affect: Mood normal.        Behavior: Behavior normal.        Thought Content: Thought content normal.        Judgment: Judgment normal.     Results for orders placed or performed in visit on 02/05/20  Comprehensive metabolic panel  Result Value Ref Range   Glucose 252 (H) 65 - 99 mg/dL   BUN 14 6 - 20 mg/dL   Creatinine, Ser 1.02 0.76 - 1.27 mg/dL   GFR calc non Af Amer 115 >59 mL/min/1.73   GFR calc Af Amer 133 >59 mL/min/1.73   BUN/Creatinine Ratio 18 9 - 20   Sodium 139 134 - 144 mmol/L   Potassium 4.2 3.5 - 5.2 mmol/L   Chloride 102 96 - 106 mmol/L   CO2 25 20 - 29 mmol/L   Calcium 9.2 8.7 - 10.2 mg/dL   Total Protein 6.4 6.0 - 8.5 g/dL   Albumin 4.4 4.0 - 5.0 g/dL   Globulin, Total 2.0 1.5 - 4.5 g/dL   Albumin/Globulin Ratio 2.2 1.2 - 2.2   Bilirubin Total 0.3 0.0 - 1.2 mg/dL   Alkaline Phosphatase 108 48 - 121 IU/L   AST 14 0 - 40 IU/L   ALT 2 0 - 44 IU/L  Lipid Panel w/o Chol/HDL Ratio  Result Value Ref Range   Cholesterol, Total 89 (L) 100 - 199 mg/dL   Triglycerides 585 0 - 149 mg/dL   HDL 41 >27 mg/dL   VLDL Cholesterol Cal 22 5 - 40 mg/dL   LDL Chol Calc (NIH) 26 0 - 99 mg/dL      Assessment & Plan:   Problem List Items Addressed This Visit      Endocrine   Uncontrolled type 2 diabetes mellitus (HCC) - Primary    Not doing well with A1c of 13.9- has only been taking metformin. Will hold on lisinopril due to dizziness. Restart trulicity and atorvastatin. Continue metformin. Recheck 1 month with goal of increasing trulicity. Work on diet and exercise. Call with any concerns.       Relevant Medications   atorvastatin (LIPITOR) 40 MG tablet   metFORMIN (GLUCOPHAGE-XR) 500 MG 24 hr tablet   Dulaglutide (TRULICITY) 0.75 MG/0.5ML SOPN   Other Relevant Orders   Bayer DCA Hb A1c Waived   CBC with Differential/Platelet   Comprehensive metabolic panel   Lipid Panel w/o Chol/HDL Ratio    Microalbumin, Urine Waived   TSH   Urinalysis, Routine w reflex microscopic   AMB Referral to [facility] Coordin[facility]     Other   Bipolar 2 disorder (HCC)    Not taking his meds. Feeling OK now. Continue to monitor.  Follow up plan: Return in about 4 weeks (around 09/12/2020).

## 2020-08-15 NOTE — Assessment & Plan Note (Signed)
Not doing well with A1c of 13.9- has only been taking metformin. Will hold on lisinopril due to dizziness. Restart trulicity and atorvastatin. Continue metformin. Recheck 1 month with goal of increasing trulicity. Work on diet and exercise. Call with any concerns.

## 2020-08-15 NOTE — Assessment & Plan Note (Signed)
Not taking his meds. Feeling OK now. Continue to monitor.

## 2020-08-15 NOTE — Chronic Care Management (AMB) (Signed)
  Chronic Care Management   Note  08/15/2020 Name: GHASSAN COGGESHALL MRN: [id number] DOB: January 21, 1986  [name] is a 35 y.o. year old male who is a primary care patient of Dorcas Carrow, DO. TIM CORRIHER III is currently enrolled in care management services. An additional referral for Pharmacy was placed.   Follow up plan: Unsuccessful telephone outreach attempt made. The care management team will reach out to the patient again over the next 7 days.  If patient returns call to provider office, please advise to call Embedded Care Management Care Guide Gwenevere Ghazi at [phone].  Gwenevere Ghazi  Care Guide, Embedded Care Coordination [facility] Management

## 2020-08-15 NOTE — Patient Instructions (Addendum)
Thoracic Strain Rehab Ask your health care provider which exercises are safe for you. Do exercises exactly as told by your health care provider and adjust them as directed. It is normal to feel mild stretching, pulling, tightness, or discomfort as you do these exercises. Stop right away if you feel sudden pain or your pain gets worse. Do not begin these exercises until told by your health care provider. Stretching and range-of-motion exercise This exercise warms up your muscles and joints and improves the movement and flexibility of your back and shoulders. This exercise also helps to relieve pain. Chest and spine stretch  1. Lie down on your back on a firm surface. 2. Roll a towel or a small blanket so it is about 4 inches (10 cm) in diameter. 3. Put the towel lengthwise under the middle of your back so it is under your spine, but not under your shoulder blades. 4. Put your hands behind your head and let your elbows fall to your sides. This will increase your stretch. 5. Take a deep breath (inhale). 6. Hold for __________ seconds. 7. Relax after you breathe out (exhale). Repeat __________ times. Complete this exercise __________ times a day. Strengthening exercises These exercises build strength and endurance in your back and your shoulder blade muscles. Endurance is the ability to use your muscles for a long time, even after they get tired. Alternating arm and leg raises  1. Get on your hands and knees on a firm surface. If you are on a hard floor, you may want to use padding, such as an exercise mat, to cushion your knees. 2. Line up your arms and legs. Your hands should be directly below your shoulders, and your knees should be directly below your hips. 3. Lift your left leg behind you. At the same time, raise your right arm and straighten it in front of you. ? Do not lift your leg higher than your hip. ? Do not lift your arm higher than your shoulder. ? Keep your abdominal and back  muscles tight. ? Keep your hips facing the ground. ? Do not arch your back. ? Keep your balance carefully, and do not hold your breath. 4. Hold for __________ seconds. 5. Slowly return to the starting position and repeat with your right leg and your left arm. Repeat __________ times. Complete this exercise __________ times a day. Straight arm rows This exercise is also called shoulder extension exercise. 1. Stand with your feet shoulder width apart. 2. Secure an exercise band to a stable object in front of you so the band is at or above shoulder height. 3. Hold one end of the exercise band in each hand. 4. Straighten your elbows and lift your hands up to shoulder height. 5. Step back, away from the secured end of the exercise band, until the band stretches. 6. Squeeze your shoulder blades together and pull your hands down to the sides of your thighs. Stop when your hands are straight down by your sides. This is shoulder extension. Do not let your hands go behind your body. 7. Hold for __________ seconds. 8. Slowly return to the starting position. Repeat __________ times. Complete this exercise __________ times a day. Prone shoulder external rotation 1. Lie on your abdomen on a firm bed so your left / right forearm hangs over the edge of the bed and your upper arm is on the bed, straight out from your body. This is the prone position. ? Your elbow should be bent. ?   Your palm should be facing your feet. 2. If instructed, hold a __________ weight in your hand. 3. Squeeze your shoulder blade toward the middle of your back. Do not let your shoulder lift toward your ear. 4. Keep your elbow bent in a 90-degree angle (right angle) while you slowly move your forearm up toward the ceiling. Move your forearm up to the height of the bed, toward your head. This is external rotation. ? Your upper arm should not move. ? At the top of the movement, your palm should face the floor. 5. Hold for __________  seconds. 6. Slowly return to the starting position and relax your muscles. Repeat __________ times. Complete this exercise __________ times a day. Rowing scapular retraction This is an exercise in which the shoulder blades (scapulae) are pulled toward each other (retraction). 1. Sit in a stable chair without armrests, or stand up. 2. Secure an exercise band to a stable object in front of you so the band is at shoulder height. 3. Hold one end of the exercise band in each hand. Your palms should face down. 4. Bring your arms out straight in front of you. 5. Step back, away from the secured end of the exercise band, until the band stretches. 6. Pull the band backward. As you do this, bend your elbows and squeeze your shoulder blades together, but avoid letting the rest of your body move. Do not shrug your shoulders upward while you do this. 7. Stop when your elbows are at your sides or slightly behind your body. 8. Hold for __________ seconds. 9. Slowly straighten your arms to return to the starting position. Repeat __________ times. Complete this exercise __________ times a day. Posture and body mechanics Good posture and healthy body mechanics can help to relieve stress in your body's tissues and joints. Body mechanics refers to the movements and positions of your body while you do your daily activities. Posture is part of body mechanics. Good posture means:  Your spine is in its natural S-curve position (neutral).  Your shoulders are pulled back slightly.  Your head is not tipped forward. Follow these guidelines to improve your posture and body mechanics in your everyday activities. Standing   When standing, keep your spine neutral and your feet about hip width apart. Keep a slight bend in your knees. Your ears, shoulders, and hips should line up with each other.  When you do a task in which you lean forward while standing in one place for a long time, place one foot up on a stable  object that is 2-4 inches (5-10 cm) high, such as a footstool. This helps keep your spine neutral. Sitting   When sitting, keep your spine neutral and keep your feet flat on the floor. Use a footrest, if necessary, and keep your thighs parallel to the floor. Avoid rounding your shoulders, and avoid tilting your head forward.  When working at a desk or a computer, keep your desk at a height where your hands are slightly lower than your elbows. Slide your chair under your desk so you are close enough to maintain good posture.  When working at a computer, place your monitor at a height where you are looking straight ahead and you do not have to tilt your head forward or downward to look at the screen. Resting When lying down and resting, avoid positions that are most painful for you.  If you have pain with activities such as sitting, bending, stooping, or squatting (flexion-basedactivities), lie   in a position in which your body does not bend very much. For example, avoid curling up on your side with your arms and knees near your chest (fetal position).  If you have pain with activities such as standing for a long time or reaching with your arms (extension-basedactivities), lie with your spine in a neutral position and bend your knees slightly. Try the following positions: ? Lie on your side with a pillow between your knees. ? Lie on your back with a pillow under your knees.  Lifting   When lifting objects, keep your feet at least shoulder width apart and tighten your abdominal muscles.  Bend your knees and hips and keep your spine neutral. It is important to lift using the strength of your legs, not your back. Do not lock your knees straight out.  Always ask for help to lift heavy or awkward objects. This information is not intended to replace advice given to you by your health care provider. Make sure you discuss any questions you have with your health care provider. Document Revised:  11/21/2018 Document Reviewed: 09/08/2018 Elsevier Patient Education  2020 [employer]. https://www.cdc.gov/vaccines/hcp/vis/vis-statements/tdap.pdf">  Tdap (Tetanus, Diphtheria, Pertussis) Vaccine: What You Need to Know 1. Why get vaccinated? Tdap vaccine can prevent tetanus, diphtheria, and pertussis. Diphtheria and pertussis spread from person to person. Tetanus enters the body through cuts or wounds.  TETANUS (T) causes painful stiffening of the muscles. Tetanus can lead to serious health problems, including being unable to open the mouth, having trouble swallowing and breathing, or death.  DIPHTHERIA (D) can lead to difficulty breathing, heart failure, paralysis, or death.  PERTUSSIS (aP), also known as "whooping cough," can cause uncontrollable, violent coughing which makes it hard to breathe, eat, or drink. Pertussis can be extremely serious in babies and young children, causing pneumonia, convulsions, brain damage, or death. In teens and adults, it can cause weight loss, loss of bladder control, passing out, and rib fractures from severe coughing. 2. Tdap vaccine Tdap is only for children 7 years and older, adolescents, and adults.  Adolescents should receive a single dose of Tdap, preferably at age 93 or 12 years. Pregnant women should get a dose of Tdap during every pregnancy, to protect the newborn from pertussis. Infants are most at risk for severe, life-threatening complications from pertussis. Adults who have never received Tdap should get a dose of Tdap. Also, adults should receive a booster dose every 10 years, or earlier in the case of a severe and dirty wound or burn. Booster doses can be either Tdap or Td (a different vaccine that protects against tetanus and diphtheria but not pertussis). Tdap may be given at the same time as other vaccines. 3. Talk with your health care provider Tell your vaccine provider if the person getting the vaccine:  Has had an allergic reaction  after a previous dose of any vaccine that protects against tetanus, diphtheria, or pertussis, or has any severe, life-threatening allergies.  Has had a coma, decreased level of consciousness, or prolonged seizures within 7 days after a previous dose of any pertussis vaccine (DTP, DTaP, or Tdap).  Has seizures or another nervous system problem.  Has ever had Guillain-Barr Syndrome (also called GBS).  Has had severe pain or swelling after a previous dose of any vaccine that protects against tetanus or diphtheria. In some cases, your health care provider may decide to postpone Tdap vaccination to a future visit.  People with minor illnesses, such as a cold, may be vaccinated.  People who are moderately or severely ill should usually wait until they recover before getting Tdap vaccine.  Your health care provider can give you more information. 4. Risks of a vaccine reaction  Pain, redness, or swelling where the shot was given, mild fever, headache, feeling tired, and nausea, vomiting, diarrhea, or stomachache sometimes happen after Tdap vaccine. People sometimes faint after medical procedures, including vaccination. Tell your provider if you feel dizzy or have vision changes or ringing in the ears.  As with any medicine, there is a very remote chance of a vaccine causing a severe allergic reaction, other serious injury, or death. 5. What if there is a serious problem? An allergic reaction could occur after the vaccinated person leaves the clinic. If you see signs of a severe allergic reaction (hives, swelling of the face and throat, difficulty breathing, a fast heartbeat, dizziness, or weakness), call 9-1-1 and get the person to the nearest hospital. For other signs that concern you, call your health care provider.  Adverse reactions should be reported to the Vaccine Adverse Event Reporting System (VAERS). Your health care provider will usually file this report, or you can do it yourself. Visit the  VAERS website at www.vaers.LAgents.no or call 680-634-1486. VAERS is only for reporting reactions, and VAERS staff do not give medical advice. 6. The National Vaccine Injury Compensation Program The [employer] Vaccine Injury Compensation Program (VICP) is a federal program that was created to compensate people who may have been injured by certain vaccines. Visit the VICP website at SpiritualWord.at or call 602-565-6113 to learn about the program and about filing a claim. There is a time limit to file a claim for compensation. 7. How can I learn more?  Ask your health care provider.  Call your local or state health department.  Contact the Centers for Disease Control and Prevention (CDC): ? Call 443 318 4443 (1-800-CDC-INFO) or ? Visit CDC's website at PicCapture.uy Vaccine Information Statement Tdap (Tetanus, Diphtheria, Pertussis) Vaccine (11/12/2018) This information is not intended to replace advice given to you by your health care provider. Make sure you discuss any questions you have with your health care provider. Document Revised: 11/21/2018 Document Reviewed: 11/24/2018 Elsevier Patient Education  2020 [employer].

## 2020-08-16 LAB — COMPREHENSIVE METABOLIC PANEL
ALT: <5 IU/L (ref 0–44)
AST: 17 IU/L (ref 0–40)
Albumin/Globulin Ratio: 2.1 (ref 1.2–2.2)
Albumin: 4.7 g/dL (ref 4.0–5.0)
Alkaline Phosphatase: 114 IU/L (ref 44–121)
BUN/Creatinine Ratio: 15 (ref 9–20)
BUN: 12 mg/dL (ref 6–20)
Bilirubin Total: 0.2 mg/dL (ref 0.0–1.2)
CO2: 22 mmol/L (ref 20–29)
Calcium: 9.3 mg/dL (ref 8.7–10.2)
Chloride: 95 mmol/L — ABNORMAL LOW (ref 96–106)
Creatinine, Ser: 0.8 mg/dL (ref 0.76–1.27)
GFR calc Af Amer: 133 mL/min/{1.73_m2} (ref >59)
GFR calc non Af Amer: 115 mL/min/{1.73_m2} (ref >59)
Globulin, Total: 2.2 g/dL (ref 1.5–4.5)
Glucose: 279 mg/dL — ABNORMAL HIGH (ref 65–99)
Potassium: 4.3 mmol/L (ref 3.5–5.2)
Sodium: 134 mmol/L (ref 134–144)
Total Protein: 6.9 g/dL (ref 6.0–8.5)

## 2020-08-16 LAB — CBC WITH DIFFERENTIAL/PLATELET
Basophils Absolute: 0 10*3/uL (ref 0.0–0.2)
Basos: 1 %
EOS (ABSOLUTE): 0.1 10*3/uL (ref 0.0–0.4)
Eos: 1 %
Hematocrit: 47.6 % (ref 37.5–51.0)
Hemoglobin: 15.9 g/dL (ref 13.0–17.7)
Immature Grans (Abs): 0 10*3/uL (ref 0.0–0.1)
Immature Granulocytes: 0 %
Lymphocytes Absolute: 2.3 10*3/uL (ref 0.7–3.1)
Lymphs: 29 %
MCH: 28.5 pg (ref 26.6–33.0)
MCHC: 33.4 g/dL (ref 31.5–35.7)
MCV: 86 fL (ref 79–97)
Monocytes Absolute: 0.5 10*3/uL (ref 0.1–0.9)
Monocytes: 7 %
Neutrophils Absolute: 5.1 10*3/uL (ref 1.4–7.0)
Neutrophils: 62 %
Platelets: 280 10*3/uL (ref 150–450)
RBC: 5.57 x10E6/uL (ref 4.14–5.80)
RDW: 12.3 % (ref 11.6–15.4)
WBC: 8.1 10*3/uL (ref 3.4–10.8)

## 2020-08-16 LAB — LIPID PANEL W/O CHOL/HDL RATIO
Cholesterol, Total: 195 mg/dL (ref 100–199)
HDL: 50 mg/dL (ref >39)
LDL Chol Calc (NIH): 114 mg/dL — ABNORMAL HIGH (ref 0–99)
Triglycerides: 177 mg/dL — ABNORMAL HIGH (ref 0–149)
VLDL Cholesterol Cal: 31 mg/dL (ref 5–40)

## 2020-08-16 LAB — TSH: TSH: 0.835 u[IU]/mL (ref 0.450–4.500)

## 2020-08-16 NOTE — Chronic Care Management (AMB) (Signed)
  Care Management   Note  08/16/2020 Name: MAHIN GUARDIA MRN: [id number] DOB: Nov 29, 1985  [name] is a 35 y.o. year old male who is a primary care patient of Dorcas Carrow, DO. I reached out to [name] by phone today in response to a referral sent by Mr. LATONYA KNIGHT III's PCP Dorcas Carrow, DO.  Mr. Puccinelli was given information about care management services today including:  1. Care management services include personalized support from designated clinical staff supervised by his physician, including individualized plan of care and coordination with other care providers 2. 24/7 contact phone numbers for assistance for urgent and routine care needs. 3. The patient may stop care management services at any time by phone call to the office staff.  Patient agreed to services and verbal consent obtained.   Follow up plan: Telephone appointment with care management team member scheduled for:09/05/2020  [facility] Guide, Embedded Care Coordination [facility] Management

## 2020-08-17 ENCOUNTER — Telehealth: Payer: Self-pay

## 2020-08-17 NOTE — Telephone Encounter (Signed)
PA for Trulicity approved. Patient notified.

## 2020-08-17 NOTE — Telephone Encounter (Signed)
PA for Trulicity initiated and submitted via Cover My Meds. Key: [id number]

## 2020-09-02 ENCOUNTER — Telehealth: Payer: Self-pay

## 2020-09-05 ENCOUNTER — Ambulatory Visit: Payer: 59 | Admitting: Pharmacist

## 2020-09-05 NOTE — Progress Notes (Signed)
Chronic Care Management Pharmacy  Name: Alexis [name]  MRN: [id number] DOB: [birth year]   Chief Complaint/ HPI  Alexis [name],  35 y.o. , male presents for his Initial CCM visit with the [facility] clinical pharmacist via telephone.  PCP : Valerie Roys, DO Patient Care Team: Valerie Roys, DO as PCP - General (Family Medicine) Vanita Ingles, RN as Registered Nurse ([place]) Vladimir Faster, [facility] (Pharmacist)  Patient's chronic conditions include: Diabetes and Bipolar disorder   Office Visits: 08/15/20- Dr. Wynetta Emery- blood work restart Trulicity, hold lisinopril d/t dizziness      Objective: No Known Allergies  Medications: Outpatient Encounter Medications as of 09/05/2020  Medication Sig  . atorvastatin (LIPITOR) 40 MG tablet Take 1 tablet (40 mg total) by mouth daily.  . Dulaglutide (TRULICITY) 3.88 EK/8.0KL SOPN Inject 0.75 mg into the skin once a week.  . metFORMIN (GLUCOPHAGE-XR) 500 MG 24 hr tablet Take 2 tablets (1,000 mg total) by mouth in the morning and at bedtime.  . pantoprazole (PROTONIX) 40 MG tablet Take 1 tablet (40 mg total) by mouth daily.  . QUEtiapine (SEROQUEL) 25 MG tablet Take 1 tablet (25 mg total) by mouth at bedtime. (Patient not taking: No sig reported)   No facility-administered encounter medications on file as of 09/05/2020.    Wt Readings from Last 3 Encounters:  08/15/20 167 lb 9.6 oz (76 kg)  02/05/20 171 lb (77.6 kg)  12/24/19 172 lb 3.2 oz (78.1 kg)    Lab Results  Component Value Date   CREATININE 0.80 08/15/2020   BUN 12 08/15/2020   GFRNONAA 115 08/15/2020   GFRAA 133 08/15/2020   NA 134 08/15/2020   K 4.3 08/15/2020   CALCIUM 9.3 08/15/2020   CO2 22 08/15/2020     Current Diagnosis/Assessment:    Goals Addressed            This Visit's Progress   . pharmacy care plan       CARE PLAN ENTRY (see longitudinal plan of care for additional care plan information)  Current Barriers:   . Chronic Disease Management support, education, and care coordination needs related to Hyperlipidemia, Diabetes, and GERD    Hyperlipidemia Lab Results  Component Value Date/Time   LDLCALC 114 (H) 08/15/2020 01:49 PM   . Pharmacist Clinical Goal(s): o Over the next 60 days, patient will work with PharmD and providers to achieve LDL goal < 100 . Current regimen:  o Atorvastatin 40 mg qpm   . Interventions: o Reviewed fill history o Provided diet and exercise counseling. . Patient self care activities - Over the next 60 days, patient will: o Focus on increasing exercise  o Adhere to DASH diet  Diabetes Lab Results  Component Value Date/Time   HGBA1C 13.9 (H) 08/15/2020 01:46 PM   HGBA1C 13.9 (H) 12/24/2019 02:50 PM   HGBA1C 6.1 03/08/2017 12:00 AM   HGBA1C 6.1 07/27/2016 12:00 AM   . Pharmacist Clinical Goal(s): o Over the next 60 days, patient will work with PharmD and providers to achieve A1c goal <7% . Current regimen:  . Metformin 1000 mg XR twice daily . Trulicity 4.91 mg every week  . Interventions: o Reviewed goal glucose readings for an A1c of <7%, we want to see fasting sugars <130 and 2 hour after meal sugars <180.  o Reviewed fill history o Discussed symptoms of hyper and hypo glycemia . Patient self care activities - Over the next 60 days, patient will:  o Check blood sugar twice daily, document, and provide at future appointments o Contact provider with any episodes of hypoglycemia o Increase exercise and focus on DASH diet  Medication management . Pharmacist Clinical Goal(s): o Over the next 60 days, patient will work with PharmD and providers to achieve optimal medication adherence . Interventions o Comprehensive medication review performed. o Continue current medication management strategy . Patient self care activities - Over the next 60 days, patient will: o Focus on medication adherence by fill dates o Take medications as prescribed o Report any  questions or concerns to PharmD and/or provider(s)  Initial goal documentation        Diabetes   A1c goal <7%  Recent Relevant Labs: Lab Results  Component Value Date/Time   HGBA1C 13.9 (H) 08/15/2020 01:46 PM   HGBA1C 13.9 (H) 12/24/2019 02:50 PM   HGBA1C 6.1 03/08/2017 12:00 AM   HGBA1C 6.1 07/27/2016 12:00 AM   MICROALBUR 10 08/15/2020 01:46 PM   MICROALBUR 10 12/24/2019 02:50 PM    BMP Latest Ref Rng & Units 08/15/2020 02/05/2020 12/24/2019  Glucose 65 - 99 mg/dL 279(H) 252(H) 405(H)  BUN 6 - 20 mg/dL 12 14 16   Creatinine 0.76 - 1.27 mg/dL 0.80 0.80 0.93  BUN/Creat Ratio 9 - 20 15 18 17   Sodium 134 - 144 mmol/L 134 139 134  Potassium 3.5 - 5.2 mmol/L 4.3 4.2 4.6  Chloride 96 - 106 mmol/L 95(L) 102 96  CO2 20 - 29 mmol/L 22 25 24   Calcium 8.7 - 10.2 mg/dL 9.3 9.2 9.3    Last diabetic Eye exam: No results found for: HMDIABEYEEXA  Last diabetic Foot exam: No results found for: HMDIABFOOTEX   Checking BG: several times weekly  Recent FBG Readings: 190, 220 Recent pre-meal BG readings: 200  Patient has failed these meds in past: NA Patient is currently uncontrolled on the following medications: [name] Metformin 1000 mg XR bid . Trulicity 7.01 mg q week   We discussed: PA for Trulicity approved 7/79/39. Patient picked up and has been taking "for a couple weeks". His weekly dose is due tonight. He is not experiencing any adverse effects. He notes his appetite and thirst have decreased. His BG readings have decreased.   His girlfriend, Jonelle Sidle (on [place]), picked up the Trulicity and he denies any cost issues. Reminded patient of coupon for manufacturer if needed. He states he is "trying to get healthy". Reviewed goal glucose readings for an A1c of <7%, we want to see fasting sugars <130 and 2 hour after meal sugars <180. Reviewed next appt with PCP 09/20/20 at 4:20 pm. Encouraged regular SMBG fasting and postprandial daily.  Plan  Continue current medications and control with diet  and exercise  Hyperlipidemia?   LDL goal < 100  Last lipids Lab Results  Component Value Date   CHOL 195 08/15/2020   HDL 50 08/15/2020   LDLCALC 114 (H) 08/15/2020   TRIG 177 (H) 08/15/2020   Hepatic Function Latest Ref Rng & Units 08/15/2020 02/05/2020 12/24/2019  Total Protein 6.0 - 8.5 g/dL 6.9 6.4 6.8  Albumin 4.0 - 5.0 g/dL 4.7 4.4 4.4  AST 0 - 40 IU/L 17 14 14   ALT 0 - 44 IU/L <5 2 <5  Alk Phosphatase 44 - 121 IU/L 114 108 115  Total Bilirubin 0.0 - 1.2 mg/dL 0.2 0.3 0.3     The ASCVD Risk score [name], et al., 2013) failed to calculate for the following reasons:   The 2013  ASCVD risk score is only valid for ages 69 to 32   Patient has failed these meds in past: NA Patient is currently query  controlled on the following medications:  . Atorvastatin 40 mg qd  We discussed:  Tiffany fills his pill box and he knows he takes 4 tabs at night so he guesses this is one. Fill history is up to date. Plan  Continue current medications. Consider addition of hyperlipidemia diagnosis code.   GERD   Patient has failed these meds in past: NA Patient is currently controlled on the following medications:  Pantoprazole 40 mg qd    Will discuss at next visit.   Plan  Continue current medications and control with diet and exercise. Consider adding diagnosis code for medication.       Medication Management   Patient's preferred pharmacy is:  [facility] [address], [place] [address] Phone: [phone] Fax: [place], [place] [address] Phone: [phone] Fax: [phone]  Uses pill box? Yes Pt endorses 90% compliance    Plan  Continue current medication management strategy    Follow up: 2 month phone visit  Junita Push. Kenton Kingfisher PharmD, [facility] [phone]

## 2020-09-09 ENCOUNTER — Telehealth: Payer: Self-pay

## 2020-09-09 ENCOUNTER — Telehealth: Payer: Self-pay | Admitting: General Practice

## 2020-09-09 NOTE — Telephone Encounter (Signed)
  Chronic Care Management   Outreach Note  09/09/2020 Name: Alexis [name] MRN: [id number] DOB: 03/09/1986  Referred by: Dorcas Carrow, DO Reason for referral : Appointment (RNCM: follow up attempt for Chronic Disease Management and Care Coordination Needs )   An unsuccessful telephone outreach was attempted today. The patient was referred to the case management team for assistance with care management and care coordination.   Follow Up Plan: The care management team will reach out to the patient again over the next 30 to 60 days.   [name] RN, MSN, CCM Community Care Coordinator [place]  [facility] Mobile: [phone]

## 2020-09-19 ENCOUNTER — Telehealth: Payer: Self-pay

## 2020-09-19 NOTE — Chronic Care Management (AMB) (Signed)
  Care Management   Note  09/19/2020 Name: [name] MRN: [id number] DOB: Nov 16, 1985  Alexis [name] is a 35 y.o. year old male who is a primary care patient of Dorcas Carrow, DO and is actively engaged with the care management team. I reached out to Alexis [name] by phone today to assist with re-scheduling a follow up visit with the RN Case Manager  Follow up plan: Unsuccessful telephone outreach attempt made. A HIPAA compliant phone message was left for the patient providing contact information and requesting a return call.  The care management team will reach out to the patient again over the next 7 days.  If patient returns call to provider office, please advise to call Embedded Care Management Care Guide Penne Lash  at [phone]  Penne Lash, RMA Care Guide, Embedded Care Coordination [facility]  [place], [place] [address] Direct Dial: [phone] [email or web address]

## 2020-09-20 ENCOUNTER — Encounter: Payer: Self-pay | Admitting: Family Medicine

## 2020-09-20 ENCOUNTER — Telehealth (INDEPENDENT_AMBULATORY_CARE_PROVIDER_SITE_OTHER): Payer: 59 | Admitting: Family Medicine

## 2020-09-20 DIAGNOSIS — E1165 Type 2 diabetes mellitus with hyperglycemia: Secondary | ICD-10-CM | POA: Diagnosis not present

## 2020-09-20 MED ORDER — TRULICITY 1.5 MG/0.5ML ~~LOC~~ SOAJ: 1.5000 mg | SUBCUTANEOUS | 1 refills | Status: AC

## 2020-09-20 NOTE — Assessment & Plan Note (Signed)
Still uncontrolled with BS 280- will increase trulicity to 1.5 and recheck 1 month.

## 2020-09-20 NOTE — Progress Notes (Signed)
There were no vitals taken for this visit.   Subjective:    Patient ID: Alexis [name], male    DOB: July 19, 1986, 35 y.o.   MRN: [id number]  HPI: Alexis [name] is a 35 y.o. male  Chief Complaint  Patient presents with  . Diabetes    Follow up   . Emesis    Patient has vomited 4-5 times since he has started new medications    DIABETES Hypoglycemic episodes:no Polydipsia/polyuria: no Visual disturbance: no Chest pain: no Paresthesias: no Glucose Monitoring: yes  Accucheck frequency: rarely  Post prandial: 280 Taking Insulin?: no Blood Pressure Monitoring: not checking Retinal Examination: Not up to Date Foot Exam: Not up to Date Diabetic Education: Completed Pneumovax: Up to Date Influenza: Not up to Date Aspirin: no  Relevant past medical, surgical, family and social history reviewed and updated as indicated. Interim medical history since our last visit reviewed. Allergies and medications reviewed and updated.  Review of Systems  Constitutional: Negative.   HENT: Negative.   Respiratory: Negative.   Cardiovascular: Negative.   Gastrointestinal: Positive for nausea and vomiting. Negative for abdominal distention, abdominal pain, anal bleeding, blood in stool, constipation, diarrhea and rectal pain.  Musculoskeletal: Negative.   Skin: Negative.   Psychiatric/Behavioral: Negative.     Per HPI unless specifically indicated above     Objective:    There were no vitals taken for this visit.  Wt Readings from Last 3 Encounters:  08/15/20 167 lb 9.6 oz (76 kg)  02/05/20 171 lb (77.6 kg)  12/24/19 172 lb 3.2 oz (78.1 kg)    Physical Exam Vitals and nursing note reviewed.  Pulmonary:     Effort: Pulmonary effort is normal. No respiratory distress.     Comments: Speaking in full sentences Neurological:     Mental Status: He is alert.  Psychiatric:        Mood and Affect: Mood normal.        Behavior: Behavior normal.        Thought Content:  Thought content normal.        Judgment: Judgment normal.     Results for orders placed or performed in visit on 08/15/20  Bayer DCA Hb A1c Waived  Result Value Ref Range   HB A1C (BAYER DCA - WAIVED) 13.9 (H) <7.0 %  CBC with Differential/Platelet  Result Value Ref Range   WBC 8.1 3.4 - 10.8 x10E3/uL   RBC 5.57 4.14 - 5.80 x10E6/uL   Hemoglobin 15.9 13.0 - 17.7 g/dL   Hematocrit 70.3 50.0 - 51.0 %   MCV 86 79 - 97 fL   MCH 28.5 26.6 - 33.0 pg   MCHC 33.4 31.5 - 35.7 g/dL   RDW 93.8 18.2 - 99.3 %   Platelets 280 150 - 450 x10E3/uL   Neutrophils 62 Not Estab. %   Lymphs 29 Not Estab. %   Monocytes 7 Not Estab. %   Eos 1 Not Estab. %   Basos 1 Not Estab. %   Neutrophils Absolute 5.1 1.4 - 7.0 x10E3/uL   Lymphocytes Absolute 2.3 0.7 - 3.1 x10E3/uL   Monocytes Absolute 0.5 0.1 - 0.9 x10E3/uL   EOS (ABSOLUTE) 0.1 0.0 - 0.4 x10E3/uL   Basophils Absolute 0.0 0.0 - 0.2 x10E3/uL   Immature Granulocytes 0 Not Estab. %   Immature Grans (Abs) 0.0 0.0 - 0.1 x10E3/uL  Comprehensive metabolic panel  Result Value Ref Range   Glucose 279 (H) 65 - 99 mg/dL  BUN 12 6 - 20 mg/dL   Creatinine, Ser 6.96 0.76 - 1.27 mg/dL   GFR calc non Af Amer 115 >59 mL/min/1.73   GFR calc Af Amer 133 >59 mL/min/1.73   BUN/Creatinine Ratio 15 9 - 20   Sodium 134 134 - 144 mmol/L   Potassium 4.3 3.5 - 5.2 mmol/L   Chloride 95 (L) 96 - 106 mmol/L   CO2 22 20 - 29 mmol/L   Calcium 9.3 8.7 - 10.2 mg/dL   Total Protein 6.9 6.0 - 8.5 g/dL   Albumin 4.7 4.0 - 5.0 g/dL   Globulin, Total 2.2 1.5 - 4.5 g/dL   Albumin/Globulin Ratio 2.1 1.2 - 2.2   Bilirubin Total 0.2 0.0 - 1.2 mg/dL   Alkaline Phosphatase 114 44 - 121 IU/L   AST 17 0 - 40 IU/L   ALT <5 0 - 44 IU/L  Lipid Panel w/o Chol/HDL Ratio  Result Value Ref Range   Cholesterol, Total 195 100 - 199 mg/dL   Triglycerides 789 (H) 0 - 149 mg/dL   HDL 50 >38 mg/dL   VLDL Cholesterol Cal 31 5 - 40 mg/dL   LDL Chol Calc (NIH) 101 (H) 0 - 99 mg/dL   Microalbumin, Urine Waived  Result Value Ref Range   Microalb, Ur Waived 10 0 - 19 mg/L   Creatinine, Urine Waived 50 10 - 300 mg/dL   Microalb/Creat Ratio <30 <30 mg/g  TSH  Result Value Ref Range   TSH 0.835 0.450 - 4.500 uIU/mL  Urinalysis, Routine w reflex microscopic  Result Value Ref Range   Specific Gravity, UA 1.015 1.005 - 1.030   pH, UA 5.5 5.0 - 7.5   Color, UA Yellow Yellow   Appearance Ur Clear Clear   Leukocytes,UA Negative Negative   Protein,UA Negative Negative/Trace   Glucose, UA Negative Negative   Ketones, UA Trace (A) Negative   RBC, UA Negative Negative   Bilirubin, UA Negative Negative   Urobilinogen, Ur 0.2 0.2 - 1.0 mg/dL   Nitrite, UA Negative Negative      Assessment & Plan:   Problem List Items Addressed This Visit      Endocrine   Uncontrolled type 2 diabetes mellitus (HCC)    Still uncontrolled with BS 280- will increase trulicity to 1.5 and recheck 1 month.       Relevant Medications   Dulaglutide (TRULICITY) 1.5 MG/0.5ML SOPN       Follow up plan: Return in about 4 weeks (around 10/18/2020).   . This visit was completed via telephone due to the restrictions of the COVID-19 pandemic. All issues as above were discussed and addressed but no physical exam was performed. If it was felt that the patient should be evaluated in the office, they were directed there. The patient verbally consented to this visit. Patient was unable to complete an audio/visual visit due to Lack of equipment. Due to the catastrophic nature of the COVID-19 pandemic, this visit was done through audio contact only. . Location of the patient: home . Location of the provider: work . Those involved with this call:  . Provider: Olevia Perches, DO . CMA: Rolley Sims, CMA . Front Desk/Registration: Harriet Pho  . Time spent on call: 21 minutes on the phone discussing health concerns. 30 minutes total spent in review of patient's record and preparation of their  chart.

## 2020-10-11 NOTE — Chronic Care Management (AMB) (Signed)
  Care Management   Note  10/11/2020 Name: DEMARQUEZ CIOLEK MRN: [id number] DOB: December 03, 1985  Alexis [name] is a 34 y.o. year old male who is a primary care patient of Dorcas Carrow, DO and is actively engaged with the care management team. I reached out to Alexis [name] by phone today to assist with re-scheduling a follow up visit with the RN Case Manager  Follow up plan: Telephone appointment with care management team member scheduled for:11/15/2020  Penne Lash, RMA Care Guide, Embedded Care Coordination [facility]  [place], [place] [address] Direct Dial: [phone] [email or web address]

## 2020-10-20 ENCOUNTER — Encounter: Payer: Self-pay | Admitting: Family Medicine

## 2020-10-20 ENCOUNTER — Telehealth: Payer: 59 | Admitting: Family Medicine

## 2020-10-20 DIAGNOSIS — E1165 Type 2 diabetes mellitus with hyperglycemia: Secondary | ICD-10-CM | POA: Diagnosis not present

## 2020-10-20 NOTE — Progress Notes (Signed)
There were no vitals taken for this visit.   Subjective:    Patient ID: Alexis [name], male    DOB: 28-Jun-1986, 35 y.o.   MRN: [id number]  HPI: Alexis [name] is a 35 y.o. male  Chief Complaint  Patient presents with  . Diabetes   DIABETES- missed last week, but otherwise doing better Hypoglycemic episodes:no Polydipsia/polyuria: no Visual disturbance: no Chest pain: no Paresthesias: no Glucose Monitoring: no  Accucheck frequency: Not Checking Taking Insulin?: no Blood Pressure Monitoring: not checking Retinal Examination: Not up to Date Foot Exam: Up to Date Diabetic Education: Completed Pneumovax: Up to Date Influenza: Not up to Date Aspirin: no   Relevant past medical, surgical, family and social history reviewed and updated as indicated. Interim medical history since our last visit reviewed. Allergies and medications reviewed and updated.  Review of Systems  Constitutional: Negative.   Respiratory: Negative.   Cardiovascular: Negative.   Gastrointestinal: Negative.   Musculoskeletal: Negative.   Psychiatric/Behavioral: Negative.     Per HPI unless specifically indicated above     Objective:    There were no vitals taken for this visit.  Wt Readings from Last 3 Encounters:  08/15/20 167 lb 9.6 oz (76 kg)  02/05/20 171 lb (77.6 kg)  12/24/19 172 lb 3.2 oz (78.1 kg)    Physical Exam Vitals and nursing note reviewed.  Constitutional:      General: He is not in acute distress.    Appearance: Normal appearance. He is not ill-appearing, toxic-appearing or diaphoretic.  HENT:     Head: Normocephalic and atraumatic.     Right Ear: External ear normal.     Left Ear: External ear normal.     Nose: Nose normal.     Mouth/Throat:     Mouth: Mucous membranes are moist.     Pharynx: Oropharynx is clear.  Eyes:     General: No scleral icterus.       Right eye: No discharge.        Left eye: No discharge.     Extraocular Movements:  Extraocular movements intact.     Conjunctiva/sclera: Conjunctivae normal.     Pupils: Pupils are equal, round, and reactive to light.  Cardiovascular:     Rate and Rhythm: Normal rate and regular rhythm.     Pulses: Normal pulses.     Heart sounds: Normal heart sounds. No murmur heard. No friction rub. No gallop.   Pulmonary:     Effort: Pulmonary effort is normal. No respiratory distress.     Breath sounds: Normal breath sounds. No stridor. No wheezing, rhonchi or rales.  Chest:     Chest wall: No tenderness.  Musculoskeletal:        General: Normal range of motion.     Cervical back: Normal range of motion and neck supple.  Skin:    General: Skin is warm and dry.     Capillary Refill: Capillary refill takes less than 2 seconds.     Coloration: Skin is not jaundiced or pale.     Findings: No bruising, erythema, lesion or rash.  Neurological:     General: No focal deficit present.     Mental Status: He is alert and oriented to person, place, and time. Mental status is at baseline.  Psychiatric:        Mood and Affect: Mood normal.        Behavior: Behavior normal.        Thought Content:  Thought content normal.        Judgment: Judgment normal.     Results for orders placed or performed in visit on 08/15/20  Bayer DCA Hb A1c Waived  Result Value Ref Range   HB A1C (BAYER DCA - WAIVED) 13.9 (H) <7.0 %  CBC with Differential/Platelet  Result Value Ref Range   WBC 8.1 3.4 - 10.8 x10E3/uL   RBC 5.57 4.14 - 5.80 x10E6/uL   Hemoglobin 15.9 13.0 - 17.7 g/dL   Hematocrit 85.0 27.7 - 51.0 %   MCV 86 79 - 97 fL   MCH 28.5 26.6 - 33.0 pg   MCHC 33.4 31.5 - 35.7 g/dL   RDW 41.2 87.8 - 67.6 %   Platelets 280 150 - 450 x10E3/uL   Neutrophils 62 Not Estab. %   Lymphs 29 Not Estab. %   Monocytes 7 Not Estab. %   Eos 1 Not Estab. %   Basos 1 Not Estab. %   Neutrophils Absolute 5.1 1.4 - 7.0 x10E3/uL   Lymphocytes Absolute 2.3 0.7 - 3.1 x10E3/uL   Monocytes Absolute 0.5 0.1 - 0.9  x10E3/uL   EOS (ABSOLUTE) 0.1 0.0 - 0.4 x10E3/uL   Basophils Absolute 0.0 0.0 - 0.2 x10E3/uL   Immature Granulocytes 0 Not Estab. %   Immature Grans (Abs) 0.0 0.0 - 0.1 x10E3/uL  Comprehensive metabolic panel  Result Value Ref Range   Glucose 279 (H) 65 - 99 mg/dL   BUN 12 6 - 20 mg/dL   Creatinine, Ser 7.20 0.76 - 1.27 mg/dL   GFR calc non Af Amer 115 >59 mL/min/1.73   GFR calc Af Amer 133 >59 mL/min/1.73   BUN/Creatinine Ratio 15 9 - 20   Sodium 134 134 - 144 mmol/L   Potassium 4.3 3.5 - 5.2 mmol/L   Chloride 95 (L) 96 - 106 mmol/L   CO2 22 20 - 29 mmol/L   Calcium 9.3 8.7 - 10.2 mg/dL   Total Protein 6.9 6.0 - 8.5 g/dL   Albumin 4.7 4.0 - 5.0 g/dL   Globulin, Total 2.2 1.5 - 4.5 g/dL   Albumin/Globulin Ratio 2.1 1.2 - 2.2   Bilirubin Total 0.2 0.0 - 1.2 mg/dL   Alkaline Phosphatase 114 44 - 121 IU/L   AST 17 0 - 40 IU/L   ALT <5 0 - 44 IU/L  Lipid Panel w/o Chol/HDL Ratio  Result Value Ref Range   Cholesterol, Total 195 100 - 199 mg/dL   Triglycerides 947 (H) 0 - 149 mg/dL   HDL 50 >09 mg/dL   VLDL Cholesterol Cal 31 5 - 40 mg/dL   LDL Chol Calc (NIH) 628 (H) 0 - 99 mg/dL  Microalbumin, Urine Waived  Result Value Ref Range   Microalb, Ur Waived 10 0 - 19 mg/L   Creatinine, Urine Waived 50 10 - 300 mg/dL   Microalb/Creat Ratio <30 <30 mg/g  TSH  Result Value Ref Range   TSH 0.835 0.450 - 4.500 uIU/mL  Urinalysis, Routine w reflex microscopic  Result Value Ref Range   Specific Gravity, UA 1.015 1.005 - 1.030   pH, UA 5.5 5.0 - 7.5   Color, UA Yellow Yellow   Appearance Ur Clear Clear   Leukocytes,UA Negative Negative   Protein,UA Negative Negative/Trace   Glucose, UA Negative Negative   Ketones, UA Trace (A) Negative   RBC, UA Negative Negative   Bilirubin, UA Negative Negative   Urobilinogen, Ur 0.2 0.2 - 1.0 mg/dL   Nitrite, UA Negative Negative  Assessment & Plan:   Problem List Items Addressed This Visit      Endocrine   Uncontrolled type 2  diabetes mellitus (HCC)    Feeling much better. Not peeing as much. Continue current regimen and recheck 1 month with A1c. Call with any concerns. Refills up to date.           Follow up plan: Return in about 4 weeks (around 11/17/2020) for in person.    . This visit was completed via MyChart due to the restrictions of the COVID-19 pandemic. All issues as above were discussed and addressed. Physical exam was done as above through visual confirmation on MyChart. If it was felt that the patient should be evaluated in the office, they were directed there. The patient verbally consented to this visit. . Location of the patient: home . Location of the provider: work . Those involved with this call:  . Provider: Olevia Perches, DO . CMA: Rolley Sims, CMA . Front Desk/Registration: Harriet Pho  . Time spent on call: 15 minutes with patient face to face via video conference. More than 50% of this time was spent in counseling and coordination of care. 23 minutes total spent in review of patient's record and preparation of their chart.

## 2020-10-20 NOTE — Assessment & Plan Note (Signed)
Feeling much better. Not peeing as much. Continue current regimen and recheck 1 month with A1c. Call with any concerns. Refills up to date.

## 2020-10-24 ENCOUNTER — Telehealth: Payer: Self-pay

## 2020-10-24 NOTE — Telephone Encounter (Signed)
-----   Message from Dorcas Carrow, DO sent at 10/20/2020  3:43 PM EST ----- 4 weeks in person

## 2020-10-24 NOTE — Telephone Encounter (Signed)
Unable to lvm to make this apt/  

## 2020-10-25 NOTE — Telephone Encounter (Signed)
Follow-up disposition: Return in about 2 weeks (around 11/08/2020).  Check out comments: Fu on labs

## 2020-10-25 NOTE — Telephone Encounter (Signed)
Unable to lvm to make this apt/  

## 2020-10-26 ENCOUNTER — Encounter: Payer: Self-pay | Admitting: Family Medicine

## 2020-10-26 NOTE — Telephone Encounter (Signed)
Unable to lvm to make this apt sent letter.  

## 2020-11-02 ENCOUNTER — Ambulatory Visit: Payer: 59 | Admitting: Pharmacist

## 2020-11-02 ENCOUNTER — Other Ambulatory Visit: Payer: Self-pay

## 2020-11-02 DIAGNOSIS — E1165 Type 2 diabetes mellitus with hyperglycemia: Secondary | ICD-10-CM

## 2020-11-02 MED ORDER — TRULICITY 1.5 MG/0.5ML ~~LOC~~ SOAJ: 1.5000 mg | SUBCUTANEOUS | 0 refills | Status: AC

## 2020-11-02 NOTE — Progress Notes (Signed)
Chronic Care Management Pharmacy Note  11/02/2020 Name:  Alexis [name] MRN:  [id number] DOB:  1986-06-29  Subjective: Alexis [name] is an 35 y.o. year old male who is a primary patient of Valerie Roys, DO.  The CCM team was consulted for assistance with disease management and care coordination needs.    Engaged with patient by telephone for follow up visit in response to provider referral for pharmacy case management and/or care coordination services.   Consent to Services:  The patient was given information about Chronic Care Management services, agreed to services, and gave verbal consent prior to initiation of services.  Please see initial visit note for detailed documentation.   Patient Care Team: Valerie Roys, DO as PCP - General (Family Medicine) Vanita Ingles, RN as Registered Nurse ([place]) Vladimir Faster, [facility] (Pharmacist)  Recent office visits: 2/08Wynetta Emery PCP_ video visit- Trulicity increased to 1.5 mg q week  Recent consult visits: none  Hospital visits: None in previous 6 months  Objective:  Lab Results  Component Value Date   CREATININE 0.80 08/15/2020   BUN 12 08/15/2020   GFRNONAA 115 08/15/2020   GFRAA 133 08/15/2020   NA 134 08/15/2020   K 4.3 08/15/2020   CALCIUM 9.3 08/15/2020   CO2 22 08/15/2020   GLUCOSE 279 (H) 08/15/2020    Lab Results  Component Value Date/Time   HGBA1C 13.9 (H) 08/15/2020 01:46 PM   HGBA1C 13.9 (H) 12/24/2019 02:50 PM   HGBA1C 6.1 03/08/2017 12:00 AM   HGBA1C 6.1 07/27/2016 12:00 AM   MICROALBUR 10 08/15/2020 01:46 PM   MICROALBUR 10 12/24/2019 02:50 PM    Last diabetic Eye exam: No results found for: HMDIABEYEEXA  Last diabetic Foot exam: No results found for: HMDIABFOOTEX   Lab Results  Component Value Date   CHOL 195 08/15/2020   HDL 50 08/15/2020   LDLCALC 114 (H) 08/15/2020   TRIG 177 (H) 08/15/2020    Hepatic Function Latest Ref Rng & Units 08/15/2020 02/05/2020 12/24/2019   Total Protein 6.0 - 8.5 g/dL 6.9 6.4 6.8  Albumin 4.0 - 5.0 g/dL 4.7 4.4 4.4  AST 0 - 40 IU/L 17 14 14   ALT 0 - 44 IU/L <5 2 <5  Alk Phosphatase 44 - 121 IU/L 114 108 115  Total Bilirubin 0.0 - 1.2 mg/dL 0.2 0.3 0.3    Lab Results  Component Value Date/Time   TSH 0.835 08/15/2020 01:49 PM   TSH 0.585 12/24/2019 03:23 PM    CBC Latest Ref Rng & Units 08/15/2020 12/24/2019 08/26/2019  WBC 3.4 - 10.8 x10E3/uL 8.1 7.5 9.6  Hemoglobin 13.0 - 17.7 g/dL 15.9 15.2 15.7  Hematocrit 37.5 - 51.0 % 47.6 45.4 47.8  Platelets 150 - 450 x10E3/uL 280 289 309    No results found for: VD25OH  Clinical ASCVD: No  The ASCVD Risk score [name], et al., 2013) failed to calculate for the following reasons:   The 2013 ASCVD risk score is only valid for ages 72 to 32    Depression screen PHQ 2/9 03/20/2019 03/06/2018 02/10/2018  Decreased Interest 1 1 0  Down, Depressed, Hopeless 0 0 0  PHQ - 2 Score 1 1 0  Altered sleeping 0 2 3  Tired, decreased energy 1 1 3   Change in appetite 1 2 3   Feeling bad or failure about yourself  0 0 3  Trouble concentrating 0 0 1  Moving slowly or fidgety/restless 0 0  0  Suicidal thoughts 0 0 0  PHQ-9 Score 3 6 13   Difficult doing work/chores Somewhat difficult Not difficult at all Very difficult      Social History   Tobacco Use  Smoking Status Current Every Day Smoker  . Packs/day: 1.00  . Types: Cigarettes  Smokeless Tobacco Never Used   BP Readings from Last 3 Encounters:  08/15/20 127/83  02/05/20 116/75  12/24/19 (!) 148/85   Pulse Readings from Last 3 Encounters:  08/15/20 87  02/05/20 99  12/24/19 (!) 118   Wt Readings from Last 3 Encounters:  08/15/20 167 lb 9.6 oz (76 kg)  02/05/20 171 lb (77.6 kg)  12/24/19 172 lb 3.2 oz (78.1 kg)   BMI Readings from Last 3 Encounters:  08/15/20 26.49 kg/m  02/05/20 27.03 kg/m  12/24/19 27.22 kg/m    Assessment/Interventions: Review of patient past medical history, allergies, medications, health  status, including review of consultants reports, laboratory and other test data, was performed as part of comprehensive evaluation and provision of chronic care management services.   SDOH:  (Social Determinants of Health) assessments and interventions performed: No   CCM Care Plan  No Known Allergies  Medications Reviewed Today    Reviewed by Valerie Roys, DO (Physician) on 10/20/20 at [place] List Status: <None>  Medication Order Taking? Sig Documenting Provider Last Dose Status Informant  atorvastatin (LIPITOR) 40 MG tablet [id number] Yes Take 1 tablet (40 mg total) by mouth daily. Johnson, Megan P, DO Taking Active   Dulaglutide (TRULICITY) 1.5 EO/7.1QR SOPN [id number] Yes Inject 1.5 mg into the skin once a week. Johnson, Megan P, DO Taking Active   metFORMIN (GLUCOPHAGE-XR) 500 MG 24 hr tablet [id number] Yes Take 2 tablets (1,000 mg total) by mouth in the morning and at bedtime. Johnson, Megan P, DO Taking Active   pantoprazole (PROTONIX) 40 MG tablet [id number] Yes Take 1 tablet (40 mg total) by mouth daily. Johnson, Megan P, DO Taking Active   QUEtiapine (SEROQUEL) 25 MG tablet [id number] No Take 1 tablet (25 mg total) by mouth at bedtime.  Patient not taking: Reported on 10/20/2020   Valerie Roys, DO Not Taking Active           Patient Active Problem List   Diagnosis Date Noted  . Bipolar 2 disorder ([place]) 02/10/2018  . Uncontrolled type 2 diabetes mellitus ([place]) 01/20/2016    Immunization History  Administered Date(s) Administered  . Pneumococcal Polysaccharide-23 08/30/2017  . Tdap 08/15/2020    Conditions to be addressed/monitored:  Diabetes  Care Plan : Pharmacy Car Plan  Updates made by Vladimir Faster, [place] since 11/02/2020 12:00 AM    Problem: Uncontolled type 2 diabetes   Priority: High    Goal: Patient-Specific Goal   This Visit's Progress: On track  Note:   Current Barriers:  . Unable to independently monitor therapeutic efficacy . Unable to  achieve control of diabetes  . Unable to self administer medications as prescribed . Does not adhere to prescribed medication regimen . Does not maintain contact with provider office .   Pharmacist Clinical Goal(s):  [name] Patient will achieve adherence to monitoring guidelines and medication adherence to achieve therapeutic efficacy . achieve control of diabetes as evidenced by A1c . achieve ability to self administer medications as prescribed through use of phone reminders as evidenced by patient report . adhere to prescribed medication regimen as evidenced by A1c, home Bg readings . contact provider office for questions/concerns as evidenced notation of  same in electronic health record through collaboration with PharmD and provider.    Interventions: . 1:1 collaboration with Valerie Roys, DO regarding development and update of comprehensive plan of care as evidenced by provider attestation and co-signature . Inter-disciplinary care team collaboration (see longitudinal plan of care) . Comprehensive medication review performed; medication list updated in electronic medical record    Patient Goals/Self-Care Activities . Patient will:  - take medications as prescribed check glucose 2-3 times weekly working toward [place] of bid, document, and provide at future appointments target a minimum of 150 minutes of moderate intensity exercise weekly  Follow Up Plan: Telephone follow up appointment with care management team member scheduled for: 4-6 weeks   Diabetes (A1c goal <7%) -Uncontrolled -Current medications: . Trulicity 1.5 mg qweek (Thursday) . Metformin 1022m XR bid -Medications previously tried: NA  -Current home glucose readings 180, 200 reports girlfrien d checks at random times and not on a regular basis . He does note improvement in his readign -Denies hypoglycemic/hyperglycemic symptoms --Endorsed adherance to Trulicity and metformin. Denies issues with copay. Tolerating  Trulicity without complaint. -Educated on A1c and blood sugar goals; Complications of diabetes including kidney damage, retinal damage, and cardiovascular disease; Exercise goal of 150 minutes per week; Benefits of routine self-monitoring of blood sugar; -Counseled to check feet daily and get yearly eye exams -Recommended to continue current medication  -Advised patient to call office and schedule in person visit .      Medication Assistance: None required.  Patient affirms current coverage meets needs.  [facility] [address]Phone: [phone]: [phone] Uses pill box? Yes, girlfriend manages meds Pt endorses  100 % compliance   Care Plan and Follow Up Patient Decision:  Patient agrees to Care Plan and Follow-up.  Plan: Telephone follow up appointment with care management team member scheduled for:  4-6 weeks  JJunita Push HKenton KingfisherPharmD, BLittle ElmMVermont Eye Surgery Laser Center [phone]

## 2020-11-02 NOTE — Patient Instructions (Addendum)
Visit Information  It was a pleasure speaking with you today. Thank you for letting me be part of your clinical team. Please call with any questions or concerns.   Goals Addressed            This Visit's Progress   . Obtain Eye Exam-Diabetes Type 2       Follow Up Date one month follow up   - schedule appointment with eye doctor    Why is this important?    Eye check-ups are important when you have diabetes.   Vision loss can be prevented.    Notes:     . pharmacy care plan       CARE PLAN ENTRY (see longitudinal plan of care for additional care plan information)  Current Barriers:  . Chronic Disease Management support, education, and care coordination needs related to Hyperlipidemia, Diabetes, and GERD    Hyperlipidemia Lab Results  Component Value Date/Time   LDLCALC 114 (H) 08/15/2020 01:49 PM   . Pharmacist Clinical Goal(s): o Over the next 60 days, patient will work with PharmD and providers to achieve LDL goal < 100 . Current regimen:  o Atorvastatin 40 mg qpm   . Interventions: o Reviewed fill history o Provided diet and exercise counseling. . Patient self care activities - Over the next 60 days, patient will: o Focus on increasing exercise  o Adhere to DASH diet  Diabetes Lab Results  Component Value Date/Time   HGBA1C 13.9 (H) 08/15/2020 01:46 PM   HGBA1C 13.9 (H) 12/24/2019 02:50 PM   HGBA1C 6.1 03/08/2017 12:00 AM   HGBA1C 6.1 07/27/2016 12:00 AM   . Pharmacist Clinical Goal(s): o Over the next 60 days, patient will work with PharmD and providers to achieve A1c goal <7% . Current regimen:  . Metformin 1000 mg XR twice daily . Trulicity 0.75 mg every week  . Interventions: o Reviewed goal glucose readings for an A1c of <7%, we want to see fasting sugars <130 and 2 hour after meal sugars <180.  o Reviewed fill history o Discussed symptoms of hyper and hypo glycemia . Patient self care activities - Over the next 60 days, patient will: o Check  blood sugar twice daily, document, and provide at future appointments o Contact provider with any episodes of hypoglycemia o Increase exercise and focus on DASH diet  Medication management . Pharmacist Clinical Goal(s): o Over the next 60 days, patient will work with PharmD and providers to achieve optimal medication adherence . Interventions o Comprehensive medication review performed. o Continue current medication management strategy . Patient self care activities - Over the next 60 days, patient will: o Focus on medication adherence by fill dates o Take medications as prescribed o Report any questions or concerns to PharmD and/or provider(s)  Initial goal documentation        The patient verbalized understanding of instructions, educational materials, and care plan provided today and agreed to receive a mailed copy of patient instructions, educational materials, and care plan.   Telephone follow up appointment with pharmacy team member scheduled for: 4- 6 weeks   [place] Pod. Tiburcio Pea PharmD, BCPS Clinical Pharmacist [phone]  Hyperglycemia Hyperglycemia is when the sugar (glucose) level in your blood is too high. High blood sugar can happen to people who have or do not have diabetes. High blood sugar can happen quickly. It can be an emergency. What are the causes? If you have diabetes, high blood sugar may be caused by:  Medicines that increase  blood sugar or affect your control of diabetes.  Getting less physical activity.  Overeating.  Being sick or injured or having an infection.  Having surgery.  Stress.  Not giving yourself enough insulin (if you are taking it). You may have high blood sugar because you have diabetes that has not been diagnosed yet. If you do not have diabetes, high blood sugar may be caused by:  Certain medicines.  Stress.  A bad illness.  An infection.  Having surgery.  Diseases of the pancreas. What increases the risk? This  condition is more likely to develop in people who have risk factors for diabetes, such as:  Having a family member with diabetes.  Certain conditions in which the body's defense system (immune system) attacks itself. These are called autoimmune disorders.  Being overweight.  Not being active.  Having a condition called insulin resistance.  Having a history of: ? Prediabetes. ? Diabetes when pregnant. ? Polycystic ovarian syndrome (PCOS). What are the signs or symptoms? This condition may not cause symptoms. If you do have symptoms, they may include:  Feeling more thirsty than normal.  Needing to pee (urinate) more often than normal.  Hunger.  Feeling very tired.  Blurry eyesight (vision). You may get other symptoms as the condition gets worse, such as:  Dry mouth.  Pain in your belly (abdomen).  Not being hungry (loss of appetite).  Breath that smells fruity.  Weakness.  Weight loss that is not planned.  A tingling or numb feeling in your hands or feet.  A headache.  Cuts or bruises that heal slowly. How is this treated? Treatment depends on the cause of your condition. Treatment may include:  Taking medicine to control your blood sugar levels.  Changing your medicine or dosage if you take insulin or other diabetes medicines.  Lifestyle changes. These may include: ? Exercising more. ? Eating healthier foods. ? Losing weight.  Treating an illness or infection.  Checking your blood sugar more often.  Stopping or reducing steroid medicines. If your condition gets very bad, you will need to be treated in the hospital. Follow these instructions at home: General instructions  Take over-the-counter and prescription medicines only as told by your doctor.  Do not smoke or use any products that contain nicotine or tobacco. If you need help quitting, ask your doctor.  If you drink alcohol: ? Limit how much you have to:  0-1 drink a day for women who are  not pregnant.  0-2 drinks a day for men. ? Know how much alcohol is in a drink. In the [place] one drink equals one 12 oz bottle of beer (355 mL), one 5 oz glass of wine (148 mL), or one 1 oz glass of hard liquor (44 mL).  Manage stress. If you need help with this, ask your doctor.  Do exercises as told by your doctor.  Keep all follow-up visits. Eating and drinking  Stay at a healthy weight.  Make sure you drink enough fluid when you: ? Exercise. ? Get sick. ? Are in hot temperatures.  Drink enough fluid to keep your pee (urine) pale yellow.   If you have diabetes:  Know the symptoms of high blood sugar.  Follow your diabetes management plan as told by your doctor. Make sure you: ? Take insulin and medicines as told. ? Follow your exercise plan. ? Follow your meal plan. Eat on time. Do not skip meals. ? Check your blood sugar as often as told.  Make sure you check before and after exercise. If you exercise longer or in a different way, check your blood sugar more often. ? Follow your sick day plan whenever you cannot eat or drink normally. Make this plan ahead of time with your doctor.  Share your diabetes management plan with people in your workplace, school, and household.  Check your pee for ketones when you are ill and as told by your doctor.  Carry a card or wear jewelry that says that you have diabetes.   Where to find more information American Diabetes Association: www.diabetes.org Contact a doctor if:  Your blood sugar level is at or above 240 mg/dL (16.1 mmol/L) for 2 days in a row.  You have problems keeping your blood sugar in your target range.  You have high blood pressure often.  You have signs of illness, such as: ? Feeling like you may vomit (feeling nauseous). ? Vomiting. ? A fever. Get help right away if:  Your blood sugar monitor reads "high" even when you are taking insulin.  You have trouble breathing.  You have a change in how you think,  feel, or act (mental status).  You feel like you may vomit, and the feeling does not go away.  You cannot stop vomiting. These symptoms may be an emergency. Get medical help right away. Call your local emergency services (911 in the [place]  Do not wait to see if the symptoms will go away.  Do not drive yourself to the hospital. Summary  Hyperglycemia is when the sugar (glucose) level in your blood is too high.  High blood sugar can happen to people who have or do not have diabetes.  Make sure you drink enough fluids and follow your meal plan. Exercise as often as told by your doctor.  Contact your doctor if you have problems keeping your blood sugar in your target range. This information is not intended to replace advice given to you by your health care provider. Make sure you discuss any questions you have with your health care provider. Document Revised: 05/13/2020 Document Reviewed: 05/13/2020 Elsevier Patient Education  2021 [employer].

## 2020-11-15 ENCOUNTER — Telehealth: Payer: 59

## 2020-11-28 ENCOUNTER — Other Ambulatory Visit: Payer: Self-pay

## 2020-11-28 MED ORDER — METFORMIN HCL ER 500 MG PO TB24: 1000.0000 mg | ORAL_TABLET | Freq: Two times a day (BID) | ORAL | 0 refills | Status: DC

## 2020-11-30 ENCOUNTER — Telehealth: Payer: 59

## 2020-11-30 ENCOUNTER — Telehealth: Payer: Self-pay | Admitting: General Practice

## 2020-11-30 NOTE — Telephone Encounter (Signed)
  Chronic Care Management   Outreach Note  11/30/2020 Name: Alexis [name] MRN: [id number] DOB: 16-Dec-1985  Referred by: Dorcas Carrow, DO Reason for referral : Appointment (RNCM: Follow up for Chronic Disease Management and Care Coordination Needs )   An unsuccessful telephone outreach was attempted today. The patient was referred to the case management team for assistance with care management and care coordination. 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 Ask for a reschedule.   Follow Up Plan: The care management team will reach out to the patient again over the next 30 days.   [name] RN, MSN, CCM Community Care Coordinator [place]  [facility] Mobile: [phone]

## 2020-12-07 ENCOUNTER — Ambulatory Visit: Payer: Self-pay | Admitting: Pharmacist

## 2020-12-07 NOTE — Progress Notes (Signed)
Chronic Care Management Pharmacy Note  12/11/2020 Name:  Alexis [name] MRN:  [id number] DOB:  1986-02-05  Subjective: Alexis [name] is an 35 y.o. year old male who is a primary patient of Valerie Roys, DO.  The CCM team was consulted for assistance with disease management and care coordination needs.    Engaged with patient by telephone for follow up visit in response to provider referral for pharmacy case management and/or care coordination services.   Consent to Services:  The patient was given information about Chronic Care Management services, agreed to services, and gave verbal consent prior to initiation of services.  Please see initial visit note for detailed documentation.   Patient Care Team: Valerie Roys, DO as PCP - General (Family Medicine) Vanita Ingles, RN as Registered Nurse ([place]) Vladimir Faster, [facility] (Pharmacist)  Recent office visits: 2/08Wynetta Emery PCP_ video visit- Trulicity increased to 1.5 mg q week  Recent consult visits: none  Hospital visits: None in previous 6 months  Objective:  Lab Results  Component Value Date   CREATININE 0.80 08/15/2020   BUN 12 08/15/2020   GFRNONAA 115 08/15/2020   GFRAA 133 08/15/2020   NA 134 08/15/2020   K 4.3 08/15/2020   CALCIUM 9.3 08/15/2020   CO2 22 08/15/2020   GLUCOSE 279 (H) 08/15/2020    Lab Results  Component Value Date/Time   HGBA1C 8.7 (H) 12/09/2020 02:13 PM   HGBA1C 13.9 (H) 08/15/2020 01:46 PM   HGBA1C 6.1 03/08/2017 12:00 AM   HGBA1C 6.1 07/27/2016 12:00 AM   MICROALBUR 10 08/15/2020 01:46 PM   MICROALBUR 10 12/24/2019 02:50 PM    Last diabetic Eye exam: No results found for: HMDIABEYEEXA  Last diabetic Foot exam: No results found for: HMDIABFOOTEX   Lab Results  Component Value Date   CHOL 195 08/15/2020   HDL 50 08/15/2020   LDLCALC 114 (H) 08/15/2020   TRIG 177 (H) 08/15/2020    Hepatic Function Latest Ref Rng & Units 08/15/2020 02/05/2020 12/24/2019   Total Protein 6.0 - 8.5 g/dL 6.9 6.4 6.8  Albumin 4.0 - 5.0 g/dL 4.7 4.4 4.4  AST 0 - 40 IU/L _0 ALT 0 - 44 IU/L <5 2 <5  Alk Phosphatase 44 - 121 IU/L 114 108 115  Total Bilirubin 0.0 - 1.2 mg/dL 0.2 0.3 0.3    Lab Results  Component Value Date/Time   TSH 0.835 08/15/2020 01:49 PM   TSH 0.585 12/24/2019 03:23 PM    CBC Latest Ref Rng & Units 08/15/2020 12/24/2019 08/26/2019  WBC 3.4 - 10.8 x10E3/uL 8.1 7.5 9.6  Hemoglobin 13.0 - 17.7 g/dL 15.9 15.2 15.7  Hematocrit 37.5 - 51.0 % 47.6 45.4 47.8  Platelets 150 - 450 x10E3/uL 280 289 309    No results found for: VD25OH  Clinical ASCVD: No  The ASCVD Risk score [name], et al., 2013) failed to calculate for the following reasons:   The 2013 ASCVD risk score is only valid for ages 77 to 65    Depression screen PHQ 2/9 12/09/2020 03/20/2019 03/06/2018  Decreased Interest 0 1 1  Down, Depressed, Hopeless 0 0 0  PHQ - 2 Score 0 1 1  Altered sleeping - 0 2  Tired, decreased energy - 1 1  Change in appetite - 1 2  Feeling bad or failure about yourself  - 0 0  Trouble concentrating - 0 0  Moving slowly or fidgety/restless - 0  0  Suicidal thoughts - 0 0  PHQ-9 Score - 3 6  Difficult doing work/chores - Somewhat difficult Not difficult at all      Social History   Tobacco Use  Smoking Status Current Every Day Smoker  . Packs/day: 1.00  . Types: Cigarettes  Smokeless Tobacco Never Used   BP Readings from Last 3 Encounters:  12/09/20 117/78  08/15/20 127/83  02/05/20 116/75   Pulse Readings from Last 3 Encounters:  12/09/20 96  08/15/20 87  02/05/20 99   Wt Readings from Last 3 Encounters:  12/09/20 168 lb 12.8 oz (76.6 kg)  08/15/20 167 lb 9.6 oz (76 kg)  02/05/20 171 lb (77.6 kg)   BMI Readings from Last 3 Encounters:  12/09/20 26.68 kg/m  08/15/20 26.49 kg/m  02/05/20 27.03 kg/m    Assessment/Interventions: Review of patient past medical history, allergies, medications, health status, including  review of consultants reports, laboratory and other test data, was performed as part of comprehensive evaluation and provision of chronic care management services.   SDOH:  (Social Determinants of Health) assessments and interventions performed: No   Conditions to be addressed/monitored:  Diabetes, HLD, GERD  Care Plan : Pharmacy Car Plan  Updates made by Vladimir Faster, [place] since 12/11/2020 12:00 AM    Problem: Uncontolled type 2 diabetes   Priority: High    Goal: Patient-Specific Goal   Recent Progress: On track  Note:   Current Barriers:  . Unable to independently monitor therapeutic efficacy . Unable to achieve control of diabetes  . Unable to self administer medications as prescribed . Does not adhere to prescribed medication regimen . Does not maintain contact with provider office .   Pharmacist Clinical Goal(s):  [name] Patient will achieve adherence to monitoring guidelines and medication adherence to achieve therapeutic efficacy . achieve control of diabetes as evidenced by A1c . achieve ability to self administer medications as prescribed through use of phone reminders as evidenced by patient report . adhere to prescribed medication regimen as evidenced by A1c, home Bg readings . contact provider office for questions/concerns as evidenced notation of same in electronic health record through collaboration with PharmD and provider.    Interventions: . 1:1 collaboration with Valerie Roys, DO regarding development and update of comprehensive plan of care as evidenced by provider attestation and co-signature . Inter-disciplinary care team collaboration (see longitudinal plan of care) . Comprehensive medication review performed; medication list updated in electronic medical record    Patient Goals/Self-Care Activities . Patient will:  - take medications as prescribed check glucose 2-3 times weekly working toward ga[place] of bid, document, and provide at future  appointments target a minimum of 150 minutes of moderate intensity exercise weekly  Follow Up Plan: Telephone follow up appointment with care management team member scheduled for: 3 months   Diabetes (A1c goal <7%) -Uncontrolled -Current medications: . Trulicity 1.5 mg qweek (Thursday) . Metformin 1064m XR bid -Medications previously tried: NA  -Current home glucose readings 180, 200 reports girlfrien d checks at random times and not on a regular basis . He does note improvement in his readign -Reports dizzy spells 2-3 times weekly. Does not typically eat breakfast and has noticed correlation of spells on days he has only coffee until lunch. --Endorsed adherance to Trulicity and metformin. Denies issues with copay. Tolerating Trulicity without complaint. -Educated on A1c and blood sugar goals; Complications of diabetes including kidney damage, retinal damage, and cardiovascular disease; Exercise goal of 150 minutes per week;  Benefits of routine self-monitoring of blood sugar; -Counseled to check feet daily and get yearly eye exams -Recommended to continue current medication  -Patient has upcoming visit with Dr. Wynetta Emery. -Recommend patient consider Freestyle Libre CGM for better BG control and ability to check at work.  Hyperlipidemia: (LDL goal < 70) -Uncontrolled -Current treatment: . Atorvastatin 40 mg qd -Medications previously tried: NA   -Current dietary patterns: drinks coffee in am, eats lunch  -Current exercise habits: no structrured exercis -Educated on Benefits of statin for ASCVD risk reduction; Importance of limiting foods high in cholesterol; Exercise goal of 150 minutes per week; -Counseled on diet and exercise extensively Recommended change to rosuvastatin 40 mg for tighter ldl control       Medication Assistance: None required.  Patient affirms current coverage meets needs.  Patient's preferred pharmacy is:  [place], [place] - [address] [place] [address] Phone: [phone] Fax: [phone]  Uses pill box? Yes, girlfriend manages meds Pt endorses  100 % compliance   Care Plan and Follow Up Patient Decision:  Patient agrees to Care Plan and Follow-up.  Plan: Telephone follow up appointment with care management team member scheduled for:  4-6 weeks  Junita Push. Kenton Kingfisher PharmD, [facility] [phone]

## 2020-12-09 ENCOUNTER — Ambulatory Visit: Payer: 59 | Admitting: Family Medicine

## 2020-12-09 ENCOUNTER — Other Ambulatory Visit: Payer: Self-pay

## 2020-12-09 ENCOUNTER — Encounter: Payer: Self-pay | Admitting: Family Medicine

## 2020-12-09 VITALS — BP 117/78 | HR 96 | Temp 98.8°F | Wt 168.8 lb

## 2020-12-09 DIAGNOSIS — E1165 Type 2 diabetes mellitus with hyperglycemia: Secondary | ICD-10-CM

## 2020-12-09 LAB — BAYER DCA HB A1C WAIVED: HB A1C (BAYER DCA - WAIVED): 8.7 % — ABNORMAL HIGH (ref <7.0)

## 2020-12-09 MED ORDER — TRULICITY 3 MG/0.5ML ~~LOC~~ SOAJ: 3.0000 mg | SUBCUTANEOUS | 3 refills | Status: DC

## 2020-12-09 MED ORDER — PANTOPRAZOLE SODIUM 40 MG PO TBEC: 40.0000 mg | DELAYED_RELEASE_TABLET | Freq: Every day | ORAL | 1 refills | Status: DC

## 2020-12-09 MED ORDER — ATORVASTATIN CALCIUM 40 MG PO TABS: 40.0000 mg | ORAL_TABLET | Freq: Every day | ORAL | 1 refills | Status: DC

## 2020-12-09 NOTE — Progress Notes (Signed)
BP 117/78   Pulse 96   Temp 98.8 F (37.1 C)   Wt 168 lb 12.8 oz (76.6 kg)   SpO2 95%   BMI 26.68 kg/m    Subjective:    Patient ID: Alexis [name], male    DOB: 11-Jan-1986, 35 y.o.   MRN: [id number]  HPI: Alexis [name] is a 35 y.o. male  Chief Complaint  Patient presents with  . Diabetes   DIABETES Hypoglycemic episodes:yes Polydipsia/polyuria: no Visual disturbance: no Chest pain: no Paresthesias: no Glucose Monitoring: no  Accucheck frequency: Not Checking Taking Insulin?: no Blood Pressure Monitoring: not checking Retinal Examination: Not up to Date Foot Exam: Up to Date Diabetic Education: Completed Pneumovax: Up to Date Influenza: Up to Date Aspirin: yes  Relevant past medical, surgical, family and social history reviewed and updated as indicated. Interim medical history since our last visit reviewed. Allergies and medications reviewed and updated.  Review of Systems  Constitutional: Negative.   Respiratory: Negative.   Cardiovascular: Negative.   Gastrointestinal: Negative.   Musculoskeletal: Negative.   Psychiatric/Behavioral: Negative.     Per HPI unless specifically indicated above     Objective:    BP 117/78   Pulse 96   Temp 98.8 F (37.1 C)   Wt 168 lb 12.8 oz (76.6 kg)   SpO2 95%   BMI 26.68 kg/m   Wt Readings from Last 3 Encounters:  12/09/20 168 lb 12.8 oz (76.6 kg)  08/15/20 167 lb 9.6 oz (76 kg)  02/05/20 171 lb (77.6 kg)    Physical Exam Vitals and nursing note reviewed.  Constitutional:      General: He is not in acute distress.    Appearance: Normal appearance. He is not ill-appearing, toxic-appearing or diaphoretic.  HENT:     Head: Normocephalic and atraumatic.     Right Ear: External ear normal.     Left Ear: External ear normal.     Nose: Nose normal.     Mouth/Throat:     Mouth: Mucous membranes are moist.     Pharynx: Oropharynx is clear.  Eyes:     General: No scleral icterus.       Right  eye: No discharge.        Left eye: No discharge.     Extraocular Movements: Extraocular movements intact.     Conjunctiva/sclera: Conjunctivae normal.     Pupils: Pupils are equal, round, and reactive to light.  Cardiovascular:     Rate and Rhythm: Normal rate and regular rhythm.     Pulses: Normal pulses.     Heart sounds: Normal heart sounds. No murmur heard. No friction rub. No gallop.   Pulmonary:     Effort: Pulmonary effort is normal. No respiratory distress.     Breath sounds: Normal breath sounds. No stridor. No wheezing, rhonchi or rales.  Chest:     Chest wall: No tenderness.  Musculoskeletal:        General: Normal range of motion.     Cervical back: Normal range of motion and neck supple.  Skin:    General: Skin is warm and dry.     Capillary Refill: Capillary refill takes less than 2 seconds.     Coloration: Skin is not jaundiced or pale.     Findings: No bruising, erythema, lesion or rash.  Neurological:     General: No focal deficit present.     Mental Status: He is alert and oriented to person, place, and  time. Mental status is at baseline.  Psychiatric:        Mood and Affect: Mood normal.        Behavior: Behavior normal.        Thought Content: Thought content normal.        Judgment: Judgment normal.     Results for orders placed or performed in visit on 08/15/20  Bayer DCA Hb A1c Waived  Result Value Ref Range   HB A1C (BAYER DCA - WAIVED) 13.9 (H) <7.0 %  CBC with Differential/Platelet  Result Value Ref Range   WBC 8.1 3.4 - 10.8 x10E3/uL   RBC 5.57 4.14 - 5.80 x10E6/uL   Hemoglobin 15.9 13.0 - 17.7 g/dL   Hematocrit 40.9 81.1 - 51.0 %   MCV 86 79 - 97 fL   MCH 28.5 26.6 - 33.0 pg   MCHC 33.4 31.5 - 35.7 g/dL   RDW 91.4 78.2 - 95.6 %   Platelets 280 150 - 450 x10E3/uL   Neutrophils 62 Not Estab. %   Lymphs 29 Not Estab. %   Monocytes 7 Not Estab. %   Eos 1 Not Estab. %   Basos 1 Not Estab. %   Neutrophils Absolute 5.1 1.4 - 7.0 x10E3/uL    Lymphocytes Absolute 2.3 0.7 - 3.1 x10E3/uL   Monocytes Absolute 0.5 0.1 - 0.9 x10E3/uL   EOS (ABSOLUTE) 0.1 0.0 - 0.4 x10E3/uL   Basophils Absolute 0.0 0.0 - 0.2 x10E3/uL   Immature Granulocytes 0 Not Estab. %   Immature Grans (Abs) 0.0 0.0 - 0.1 x10E3/uL  Comprehensive metabolic panel  Result Value Ref Range   Glucose 279 (H) 65 - 99 mg/dL   BUN 12 6 - 20 mg/dL   Creatinine, Ser 2.13 0.76 - 1.27 mg/dL   GFR calc non Af Amer 115 >59 mL/min/1.73   GFR calc Af Amer 133 >59 mL/min/1.73   BUN/Creatinine Ratio 15 9 - 20   Sodium 134 134 - 144 mmol/L   Potassium 4.3 3.5 - 5.2 mmol/L   Chloride 95 (L) 96 - 106 mmol/L   CO2 22 20 - 29 mmol/L   Calcium 9.3 8.7 - 10.2 mg/dL   Total Protein 6.9 6.0 - 8.5 g/dL   Albumin 4.7 4.0 - 5.0 g/dL   Globulin, Total 2.2 1.5 - 4.5 g/dL   Albumin/Globulin Ratio 2.1 1.2 - 2.2   Bilirubin Total 0.2 0.0 - 1.2 mg/dL   Alkaline Phosphatase 114 44 - 121 IU/L   AST 17 0 - 40 IU/L   ALT <5 0 - 44 IU/L  Lipid Panel w/o Chol/HDL Ratio  Result Value Ref Range   Cholesterol, Total 195 100 - 199 mg/dL   Triglycerides 086 (H) 0 - 149 mg/dL   HDL 50 >57 mg/dL   VLDL Cholesterol Cal 31 5 - 40 mg/dL   LDL Chol Calc (NIH) 846 (H) 0 - 99 mg/dL  Microalbumin, Urine Waived  Result Value Ref Range   Microalb, Ur Waived 10 0 - 19 mg/L   Creatinine, Urine Waived 50 10 - 300 mg/dL   Microalb/Creat Ratio <30 <30 mg/g  TSH  Result Value Ref Range   TSH 0.835 0.450 - 4.500 uIU/mL  Urinalysis, Routine w reflex microscopic  Result Value Ref Range   Specific Gravity, UA 1.015 1.005 - 1.030   pH, UA 5.5 5.0 - 7.5   Color, UA Yellow Yellow   Appearance Ur Clear Clear   Leukocytes,UA Negative Negative   Protein,UA Negative Negative/Trace  Glucose, UA Negative Negative   Ketones, UA Trace (A) Negative   RBC, UA Negative Negative   Bilirubin, UA Negative Negative   Urobilinogen, Ur 0.2 0.2 - 1.0 mg/dL   Nitrite, UA Negative Negative      Assessment & Plan:    Problem List Items Addressed This Visit      Endocrine   Uncontrolled type 2 diabetes mellitus (HCC) - Primary    Doing much better with A1c of 8.7 down from >13. Will increase his trulicity to 3mg  and recheck 3 months. Call with any concerns.       Relevant Medications   Dulaglutide (TRULICITY) 3 MG/0.5ML SOPN   atorvastatin (LIPITOR) 40 MG tablet   Other Relevant Orders   Bayer DCA Hb A1c Waived       Follow up plan: Return in about 3 months (around 03/10/2021).

## 2020-12-09 NOTE — Assessment & Plan Note (Signed)
Doing much better with A1c of 8.7 down from >13. Will increase his trulicity to 3mg  and recheck 3 months. Call with any concerns.

## 2021-01-10 ENCOUNTER — Telehealth: Payer: Self-pay

## 2021-01-10 ENCOUNTER — Telehealth: Payer: 59

## 2021-01-10 NOTE — Telephone Encounter (Cosign Needed)
  Care Management   Follow Up Note   01/10/2021 Name: Alexis [name] MRN: [id number] DOB: 07-Sep-1985   Referred by: Dorcas Carrow, DO Reason for referral : Care Coordination (RN CM follow up call for Chronic Care Management and Care Coordination Needs - 3rd attempt.)   Third unsuccessful telephone outreach was attempted today. The patient was referred to the case management team for assistance with care management and care coordination. The patient's primary care provider has been notified of our unsuccessful attempts to make or maintain contact with the patient. The care management team is pleased to engage with this patient at any time in the future should he/she be interested in assistance from the care management team.   Follow Up Plan: We have been unable to make contact with the patient for follow up. The care management team is available to follow up with the patient after provider conversation with the patient regarding recommendation for care management engagement and subsequent re-referral to the care management team.   Virgina Norfolk RN, BSN Community Care Coordinator [place]  [facility] Mobile: [phone]

## 2021-02-03 ENCOUNTER — Other Ambulatory Visit: Payer: Self-pay

## 2021-03-10 ENCOUNTER — Encounter: Payer: Self-pay | Admitting: Family Medicine

## 2021-03-10 ENCOUNTER — Ambulatory Visit: Payer: 59 | Admitting: Family Medicine

## 2021-03-10 ENCOUNTER — Other Ambulatory Visit: Payer: Self-pay

## 2021-03-10 VITALS — BP 130/79 | HR 94 | Temp 98.5°F | Ht 67.0 in | Wt 165.4 lb

## 2021-03-10 DIAGNOSIS — E1165 Type 2 diabetes mellitus with hyperglycemia: Secondary | ICD-10-CM

## 2021-03-10 DIAGNOSIS — M79662 Pain in left lower leg: Secondary | ICD-10-CM | POA: Diagnosis not present

## 2021-03-10 LAB — BAYER DCA HB A1C WAIVED: HB A1C (BAYER DCA - WAIVED): 9.4 % — ABNORMAL HIGH (ref <7.0)

## 2021-03-10 MED ORDER — EMPAGLIFLOZIN 25 MG PO TABS
25.0000 mg | ORAL_TABLET | Freq: Every day | ORAL | 3 refills | Status: DC
Start: 2021-03-10 — End: 2021-03-23

## 2021-03-10 MED ORDER — TRULICITY 0.75 MG/0.5ML ~~LOC~~ SOAJ: 0.7500 mg | SUBCUTANEOUS | 0 refills | Status: DC

## 2021-03-10 MED ORDER — METFORMIN HCL ER 500 MG PO TB24: 1000.0000 mg | ORAL_TABLET | Freq: Two times a day (BID) | ORAL | 0 refills | Status: DC

## 2021-03-10 MED ORDER — TRULICITY 1.5 MG/0.5ML ~~LOC~~ SOAJ
1.5000 mg | SUBCUTANEOUS | 1 refills | Status: DC
Start: 2021-03-10 — End: 2021-03-23

## 2021-03-10 NOTE — Assessment & Plan Note (Signed)
Not under good control with A1c of 9.4- will cut back to 1.5mg  on trulicty and add jardiance. Recheck 3 months. Call with any concerns.

## 2021-03-10 NOTE — Progress Notes (Signed)
BP 130/79   Pulse 94   Temp 98.5 F (36.9 C)   Ht 5\' 7"  (1.702 m)   Wt 165 lb 6 oz (75 kg)   BMI 25.90 kg/m    Subjective:    Patient ID: [name], male    DOB: [birth year], 35 y.o.   MRN: [id number]  HPI: Alexis [name] is a 35 y.o. male  Chief Complaint  Patient presents with   Diabetes    Stopped trulicity about 1 month ago   Leg Pain    Patient states that he has pain over his shin. Painful to the touch    DIABETES- stopped his trulicity because he was having a lot of abdominal upset for about a month Hypoglycemic episodes:no Polydipsia/polyuria: yes Visual disturbance: no Chest pain: no Paresthesias: no Glucose Monitoring: rarely Taking Insulin?: no Blood Pressure Monitoring: not checking Retinal Examination: Not up to Date Foot Exam: Up to Date Diabetic Education: Completed Pneumovax: Up to Date Influenza: Up to Date Aspirin: no  LEG pain Duration: 4 months Pain: yes Severity: severe  Quality:  sharp Location:  top of shin bone Bilateral:  no Onset: sudden Frequency: constant- worse when he touches it Time of  day:    constant Sudden unintentional leg jerking:   no Paresthesias:   yes Decreased sensation:  yes Weakness:   no Insomnia:   no Fatigue:   no  Relevant past medical, surgical, family and social history reviewed and updated as indicated. Interim medical history since our last visit reviewed. Allergies and medications reviewed and updated.  Review of Systems  Constitutional: Negative.   Respiratory: Negative.    Cardiovascular: Negative.   Gastrointestinal: Negative.   Musculoskeletal:  Positive for myalgias. Negative for arthralgias, back pain, gait problem, joint swelling, neck pain and neck stiffness.  Neurological: Negative.   Psychiatric/Behavioral: Negative.     Per HPI unless specifically indicated above     Objective:    BP 130/79   Pulse 94   Temp 98.5 F (36.9 C)   Ht 5\' 7"  (1.702 m)   Wt 165 lb  6 oz (75 kg)   BMI 25.90 kg/m   Wt Readings from Last 3 Encounters:  03/10/21 165 lb 6 oz (75 kg)  12/09/20 168 lb 12.8 oz (76.6 kg)  08/15/20 167 lb 9.6 oz (76 kg)    Physical Exam Vitals and nursing note reviewed.  Constitutional:      General: He is not in acute distress.    Appearance: Normal appearance. He is not ill-appearing, toxic-appearing or diaphoretic.  HENT:     Head: Normocephalic and atraumatic.     Right Ear: External ear normal.     Left Ear: External ear normal.     Nose: Nose normal.     Mouth/Throat:     Mouth: Mucous membranes are moist.     Pharynx: Oropharynx is clear.  Eyes:     General: No scleral icterus.       Right eye: No discharge.        Left eye: No discharge.     Extraocular Movements: Extraocular movements intact.     Conjunctiva/sclera: Conjunctivae normal.     Pupils: Pupils are equal, round, and reactive to light.  Cardiovascular:     Rate and Rhythm: Normal rate and regular rhythm.     Pulses: Normal pulses.     Heart sounds: Normal heart sounds. No murmur heard.   No friction rub. No gallop.  Pulmonary:     Effort: Pulmonary effort is normal. No respiratory distress.     Breath sounds: Normal breath sounds. No stridor. No wheezing, rhonchi or rales.  Chest:     Chest wall: No tenderness.  Musculoskeletal:        General: Normal range of motion.     Cervical back: Normal range of motion and neck supple.  Skin:    General: Skin is warm and dry.     Capillary Refill: Capillary refill takes less than 2 seconds.     Coloration: Skin is not jaundiced or pale.     Findings: No bruising, erythema, lesion or rash.  Neurological:     General: No focal deficit present.     Mental Status: He is alert and oriented to person, place, and time. Mental status is at baseline.  Psychiatric:        Mood and Affect: Mood normal.        Behavior: Behavior normal.        Thought Content: Thought content normal.        Judgment: Judgment normal.     Results for orders placed or performed in visit on 12/09/20  Bayer DCA Hb A1c Waived  Result Value Ref Range   HB A1C (BAYER DCA - WAIVED) 8.7 (H) <7.0 %      Assessment & Plan:   Problem List Items Addressed This Visit       Endocrine   Uncontrolled type 2 diabetes mellitus (HCC) - Primary    Not under good control with A1c of 9.4- will cut back to 1.5mg  on trulicty and add jardiance. Recheck 3 months. Call with any concerns.        Relevant Medications   Dulaglutide (TRULICITY) 0.75 MG/0.5ML SOPN   Dulaglutide (TRULICITY) 1.5 MG/0.5ML SOPN   empagliflozin (JARDIANCE) 25 MG TABS tablet   metFORMIN (GLUCOPHAGE-XR) 500 MG 24 hr tablet   Other Relevant Orders   Bayer DCA Hb A1c Waived   Comprehensive metabolic panel   Lipid Panel w/o Chol/HDL Ratio   Other Visit Diagnoses     Pain in left shin       Concern for nerve issue vs stress fracture- will obtain x-ray. Await results.    Relevant Orders   DG FEMUR MIN 2 VIEWS LEFT        Follow up plan: Return in about 3 months (around 06/10/2021).

## 2021-03-11 LAB — COMPREHENSIVE METABOLIC PANEL
ALT: 1 IU/L (ref 0–44)
AST: 13 IU/L (ref 0–40)
Albumin/Globulin Ratio: 2 (ref 1.2–2.2)
Albumin: 4.7 g/dL (ref 4.0–5.0)
Alkaline Phosphatase: 103 IU/L (ref 44–121)
BUN/Creatinine Ratio: 18 (ref 9–20)
BUN: 15 mg/dL (ref 6–20)
Bilirubin Total: 0.4 mg/dL (ref 0.0–1.2)
CO2: 21 mmol/L (ref 20–29)
Calcium: 9.4 mg/dL (ref 8.7–10.2)
Chloride: 99 mmol/L (ref 96–106)
Creatinine, Ser: 0.84 mg/dL (ref 0.76–1.27)
Globulin, Total: 2.3 g/dL (ref 1.5–4.5)
Glucose: 270 mg/dL — ABNORMAL HIGH (ref 65–99)
Potassium: 4.3 mmol/L (ref 3.5–5.2)
Sodium: 136 mmol/L (ref 134–144)
Total Protein: 7 g/dL (ref 6.0–8.5)
eGFR: 115 mL/min/{1.73_m2} (ref >59)

## 2021-03-11 LAB — LIPID PANEL W/O CHOL/HDL RATIO
Cholesterol, Total: 88 mg/dL — ABNORMAL LOW (ref 100–199)
HDL: 47 mg/dL (ref >39)
LDL Chol Calc (NIH): 27 mg/dL (ref 0–99)
Triglycerides: 63 mg/dL (ref 0–149)
VLDL Cholesterol Cal: 14 mg/dL (ref 5–40)

## 2021-03-14 ENCOUNTER — Ambulatory Visit (INDEPENDENT_AMBULATORY_CARE_PROVIDER_SITE_OTHER): Payer: 59 | Admitting: Nurse Practitioner

## 2021-03-14 ENCOUNTER — Encounter: Payer: Self-pay | Admitting: Nurse Practitioner

## 2021-03-14 ENCOUNTER — Ambulatory Visit
Admission: RE | Admit: 2021-03-14 | Discharge: 2021-03-14 | Disposition: A | Discharge status: Home / Self Care | Payer: 59 | Source: Ambulatory Visit | Attending: Nurse Practitioner | Admitting: Nurse Practitioner

## 2021-03-14 ENCOUNTER — Other Ambulatory Visit: Payer: Self-pay

## 2021-03-14 DIAGNOSIS — S30861A Insect bite (nonvenomous) of abdominal wall, initial encounter: Secondary | ICD-10-CM

## 2021-03-14 DIAGNOSIS — R109 Unspecified abdominal pain: Secondary | ICD-10-CM | POA: Diagnosis present

## 2021-03-14 DIAGNOSIS — W57XXXA Bitten or stung by nonvenomous insect and other nonvenomous arthropods, initial encounter: Secondary | ICD-10-CM

## 2021-03-14 MED ORDER — CIPROFLOXACIN HCL 500 MG PO TABS: 500.0000 mg | ORAL_TABLET | Freq: Two times a day (BID) | ORAL | 0 refills | Status: DC

## 2021-03-14 MED ORDER — METRONIDAZOLE 500 MG PO TABS: 500.0000 mg | ORAL_TABLET | Freq: Two times a day (BID) | ORAL | 0 refills | Status: DC

## 2021-03-14 NOTE — Progress Notes (Signed)
Results discussed with patient over the phone.

## 2021-03-14 NOTE — Progress Notes (Signed)
There were no vitals taken for this visit.   Subjective:    Patient ID: Alexis [name], male    DOB: 01-06-1986, 35 y.o.   MRN: [id number]  HPI: Alexis [name] is a 35 y.o. male  Chief Complaint  Patient presents with   Abdominal Pain   Patient seen today via virtual visit with complaints of abdomen pain (sharp shooting pains) especially when he stands up, diarrhea, fatigue, poor PO intake.  Patient was seen in the ER last nihgt and found to have dehydration and a WBC of 17. Temp yesterday was 100.0.  Denies SOB, CP.   Patient recently restarted Jardiance and has been having lower blood sugars than he was used to.  Patient also recently found a tick on his abdomen that he removed today.     Relevant past medical, surgical, family and social history reviewed and updated as indicated. Interim medical history since our last visit reviewed. Allergies and medications reviewed and updated.  Review of Systems  Constitutional:  Positive for fatigue and fever.  Respiratory:  Negative for shortness of breath.   Cardiovascular:  Negative for chest pain.  Gastrointestinal:  Positive for abdominal pain and diarrhea.   Per HPI unless specifically indicated above     Objective:    There were no vitals taken for this visit.  Wt Readings from Last 3 Encounters:  03/10/21 165 lb 6 oz (75 kg)  12/09/20 168 lb 12.8 oz (76.6 kg)  08/15/20 167 lb 9.6 oz (76 kg)    Physical Exam Vitals and nursing note reviewed.  Constitutional:      General: He is not in acute distress.    Appearance: He is not ill-appearing.  HENT:     Head: Normocephalic.     Right Ear: Hearing normal.     Left Ear: Hearing normal.     Nose: Nose normal.  Pulmonary:     Effort: Pulmonary effort is normal. No respiratory distress.  Neurological:     Mental Status: He is alert.  Psychiatric:        Mood and Affect: Mood normal.        Behavior: Behavior normal.        Thought Content: Thought content  normal.        Judgment: Judgment normal.    Results for orders placed or performed in visit on 03/10/21  Bayer DCA Hb A1c Waived  Result Value Ref Range   HB A1C (BAYER DCA - WAIVED) 9.4 (H) <7.0 %  Comprehensive metabolic panel  Result Value Ref Range   Glucose 270 (H) 65 - 99 mg/dL   BUN 15 6 - 20 mg/dL   Creatinine, Ser 0.84 0.76 - 1.27 mg/dL   eGFR 115 >59 mL/min/1.73   BUN/Creatinine Ratio 18 9 - 20   Sodium 136 134 - 144 mmol/L   Potassium 4.3 3.5 - 5.2 mmol/L   Chloride 99 96 - 106 mmol/L   CO2 21 20 - 29 mmol/L   Calcium 9.4 8.7 - 10.2 mg/dL   Total Protein 7.0 6.0 - 8.5 g/dL   Albumin 4.7 4.0 - 5.0 g/dL   Globulin, Total 2.3 1.5 - 4.5 g/dL   Albumin/Globulin Ratio 2.0 1.2 - 2.2   Bilirubin Total 0.4 0.0 - 1.2 mg/dL   Alkaline Phosphatase 103 44 - 121 IU/L   AST 13 0 - 40 IU/L   ALT 1 0 - 44 IU/L  Lipid Panel w/o Chol/HDL Ratio  Result Value  Ref Range   Cholesterol, Total 88 (L) 100 - 199 mg/dL   Triglycerides 63 0 - 149 mg/dL   HDL 47 >39 mg/dL   VLDL Cholesterol Cal 14 5 - 40 mg/dL   LDL Chol Calc (NIH) 27 0 - 99 mg/dL      Assessment & Plan:   Problem List Items Addressed This Visit   None Visit Diagnoses     Abdominal pain, unspecified abdominal location    -  Primary   STAT CT ordered to evaluate for diverticulitis. Repeat CBC and CMP ordered. Will make recommendations based on imaging.   Relevant Orders   CT Abdomen Pelvis Wo Contrast   Tick bite of abdominal wall, initial encounter       Labs ordered today. Will make recommendations based on lab results.    Relevant Orders   Rocky mtn spotted fvr abs pnl(IgG+IgM)   Comp Met (CMET)   CBC w/Diff   Lyme Ab/Western Blot Reflex        Follow up plan: Return if symptoms worsen or fail to improve.  This visit was completed via MyChart due to the restrictions of the COVID-19 pandemic. All issues as above were discussed and addressed. Physical exam was done as above through visual confirmation on  MyChart. If it was felt that the patient should be evaluated in the office, they were directed there. The patient verbally consented to this visit. Location of the patient: Home Location of the provider: Office Those involved with this call:  Provider: Jon Billings, NP CMA: Irena Reichmann, [place] Desk/Registration: Roe Rutherford This encounter was conducted via Video.  I spent 15 dedicated to the care of this patient on the date of this encounter to include previsit review of 20, face to face time with the patient, and post visit ordering of testing.

## 2021-03-14 NOTE — Addendum Note (Signed)
Addended by: Pablo Ledger on: 03/14/2021 04:59 PM   Modules accepted: Orders

## 2021-03-14 NOTE — Addendum Note (Signed)
Addended by: Larae Grooms on: 03/14/2021 04:14 PM   Modules accepted: Orders

## 2021-03-14 NOTE — Addendum Note (Signed)
Addended by: Dorcas Carrow on: 03/14/2021 05:00 PM   Modules accepted: Orders

## 2021-03-14 NOTE — Addendum Note (Signed)
Addended by: Pablo Ledger on: 03/14/2021 04:05 PM   Modules accepted: Orders

## 2021-03-15 DIAGNOSIS — K529 Noninfective gastroenteritis and colitis, unspecified: Secondary | ICD-10-CM | POA: Insufficient documentation

## 2021-03-15 DIAGNOSIS — A0472 Enterocolitis due to Clostridium difficile, not specified as recurrent: Secondary | ICD-10-CM | POA: Insufficient documentation

## 2021-03-15 DIAGNOSIS — E1110 Type 2 diabetes mellitus with ketoacidosis without coma: Secondary | ICD-10-CM | POA: Insufficient documentation

## 2021-03-15 NOTE — Progress Notes (Signed)
Hi Alexis [name].  Your Lyme test was negative.

## 2021-03-15 NOTE — Progress Notes (Signed)
Hi Alexis [name]. Your white count is still elevated. Other lab work looks good. I will send another message when your Lyme and rocky mountain spotted fever come back.

## 2021-03-16 ENCOUNTER — Ambulatory Visit: Payer: 59 | Admitting: Family Medicine

## 2021-03-18 LAB — CBC WITH DIFFERENTIAL/PLATELET
Basophils Absolute: 0 10*3/uL (ref 0.0–0.2)
Basos: 0 %
EOS (ABSOLUTE): 0 10*3/uL (ref 0.0–0.4)
Eos: 0 %
Hematocrit: 50.4 % (ref 37.5–51.0)
Hemoglobin: 16.8 g/dL (ref 13.0–17.7)
Immature Grans (Abs): 0.1 10*3/uL (ref 0.0–0.1)
Immature Granulocytes: 1 %
Lymphocytes Absolute: 0.8 10*3/uL (ref 0.7–3.1)
Lymphs: 5 %
MCH: 28.3 pg (ref 26.6–33.0)
MCHC: 33.3 g/dL (ref 31.5–35.7)
MCV: 85 fL (ref 79–97)
Monocytes Absolute: 1 10*3/uL — ABNORMAL HIGH (ref 0.1–0.9)
Monocytes: 7 %
Neutrophils Absolute: 12.9 10*3/uL — ABNORMAL HIGH (ref 1.4–7.0)
Neutrophils: 87 %
Platelets: 311 10*3/uL (ref 150–450)
RBC: 5.93 x10E6/uL — ABNORMAL HIGH (ref 4.14–5.80)
RDW: 13.1 % (ref 11.6–15.4)
WBC: 14.8 10*3/uL — ABNORMAL HIGH (ref 3.4–10.8)

## 2021-03-18 LAB — COMPREHENSIVE METABOLIC PANEL
ALT: <5 IU/L (ref 0–44)
AST: 15 IU/L (ref 0–40)
Albumin/Globulin Ratio: 2.2 (ref 1.2–2.2)
Albumin: 5 g/dL (ref 4.0–5.0)
Alkaline Phosphatase: 102 IU/L (ref 44–121)
BUN/Creatinine Ratio: 22 — ABNORMAL HIGH (ref 9–20)
BUN: 21 mg/dL — ABNORMAL HIGH (ref 6–20)
Bilirubin Total: 0.4 mg/dL (ref 0.0–1.2)
CO2: 12 mmol/L — ABNORMAL LOW (ref 20–29)
Calcium: 9.4 mg/dL (ref 8.7–10.2)
Chloride: 97 mmol/L (ref 96–106)
Creatinine, Ser: 0.94 mg/dL (ref 0.76–1.27)
Globulin, Total: 2.3 g/dL (ref 1.5–4.5)
Glucose: 171 mg/dL — ABNORMAL HIGH (ref 65–99)
Potassium: 4.7 mmol/L (ref 3.5–5.2)
Sodium: 135 mmol/L (ref 134–144)
Total Protein: 7.3 g/dL (ref 6.0–8.5)
eGFR: 107 mL/min/{1.73_m2} (ref >59)

## 2021-03-18 LAB — LYME DISEASE SEROLOGY W/REFLEX: Lyme Total Antibody EIA: NEGATIVE

## 2021-03-18 LAB — ROCKY MTN SPOTTED FVR ABS PNL(IGG+IGM)
RMSF IgG: NEGATIVE
RMSF IgM: 0.61 index (ref 0.00–0.89)

## 2021-03-20 NOTE — Progress Notes (Signed)
Hi Ken. The test for rocky mountain spotted fever was also negative.

## 2021-03-23 ENCOUNTER — Other Ambulatory Visit: Payer: Self-pay | Admitting: Family Medicine

## 2021-03-23 ENCOUNTER — Ambulatory Visit: Payer: 59 | Admitting: Family Medicine

## 2021-03-23 ENCOUNTER — Other Ambulatory Visit: Payer: Self-pay

## 2021-03-23 ENCOUNTER — Encounter: Payer: Self-pay | Admitting: Family Medicine

## 2021-03-23 VITALS — BP 137/86 | HR 68 | Temp 98.2°F | Ht 67.0 in | Wt 168.8 lb

## 2021-03-23 DIAGNOSIS — E1110 Type 2 diabetes mellitus with ketoacidosis without coma: Secondary | ICD-10-CM

## 2021-03-23 DIAGNOSIS — E1165 Type 2 diabetes mellitus with hyperglycemia: Secondary | ICD-10-CM | POA: Diagnosis not present

## 2021-03-23 MED ORDER — METFORMIN HCL 500 MG PO TABS: 500.0000 mg | ORAL_TABLET | Freq: Two times a day (BID) | ORAL | 2 refills | Status: DC

## 2021-03-23 MED ORDER — INSULIN GLARGINE 100 UNIT/ML SOLOSTAR PEN: 15.0000 [IU] | PEN_INJECTOR | Freq: Every day | SUBCUTANEOUS | 2 refills | Status: DC

## 2021-03-23 NOTE — Progress Notes (Signed)
BP 137/86   Pulse 68   Temp 98.2 F (36.8 C) (Oral)   Ht 5' 7"  (1.702 m)   Wt 168 lb 12.8 oz (76.6 kg)   SpO2 98%   BMI 26.44 kg/m    Subjective:    Patient ID: Alexis [name], male    DOB: 1986-05-27, 35 y.o.   MRN: [id number]  HPI: Alexis [name] is a 35 y.o. male  Chief Complaint  Patient presents with   Hospitalization Follow-up   Transition of [facility] Follow up.   Hospital/Facility: [facility] D/C Physician: Renaee Munda, MD D/C Date: 03/15/2021  Records Requested: 03/23/21 Records Received:  03/23/21 Records Reviewed:  03/23/21  Diagnoses on Discharge: DKA (diabetic ketoacidosis) (CMS-HCC) Colitis  Date of interactive Contact within 48 hours of discharge: 03/17/21 and 03/19/21 Contact was through: phone  Date of 7 day or 14 day face-to-face visit:  03/23/21  within 7 days  Outpatient Encounter Medications as of 03/23/2021  Medication Sig   ACCU-CHEK GUIDE test strip See admin instructions.   atorvastatin (LIPITOR) 40 MG tablet Take 1 tablet (40 mg total) by mouth daily.   BD ULTRA-FINE PEN NEEDLES 29G X 12.7MM MISC USE AS DIRECTED WITH LANTUS   [DISCONTINUED] insulin glargine (LANTUS) 100 UNIT/ML Solostar Pen Inject 10 Units into the skin daily.   [DISCONTINUED] metFORMIN (GLUCOPHAGE) 500 MG tablet Take 1 tablet by mouth in the morning and at bedtime.   [DISCONTINUED] metFORMIN (GLUCOPHAGE-XR) 500 MG 24 hr tablet Take 2 tablets (1,000 mg total) by mouth in the morning and at bedtime. (Patient taking differently: Take 500 mg by mouth in the morning and at bedtime.)   insulin glargine (LANTUS) 100 UNIT/ML Solostar Pen Inject 15 Units into the skin daily.   metFORMIN (GLUCOPHAGE) 500 MG tablet Take 1 tablet (500 mg total) by mouth in the morning and at bedtime.   pantoprazole (PROTONIX) 40 MG tablet Take 1 tablet (40 mg total) by mouth daily. (Patient not taking: Reported on 03/23/2021)   [DISCONTINUED] ciprofloxacin (CIPRO) 500 MG  tablet Take 1 tablet (500 mg total) by mouth 2 (two) times daily for 10 days.   [DISCONTINUED] Dulaglutide (TRULICITY) 1.00 FH/2.1FX SOPN Inject 0.75 mg into the skin once a week.   [DISCONTINUED] Dulaglutide (TRULICITY) 1.5 JO/8.3GP SOPN Inject 1.5 mg into the skin once a week.   [DISCONTINUED] empagliflozin (JARDIANCE) 25 MG TABS tablet Take 1 tablet (25 mg total) by mouth daily before breakfast. (Patient not taking: Reported on 03/23/2021)   [DISCONTINUED] metroNIDAZOLE (FLAGYL) 500 MG tablet Take 1 tablet (500 mg total) by mouth 2 (two) times daily for 10 days.   No facility-administered encounter medications on file as of 03/23/2021.  Per Hospitalist: "35 year old man with history of presumed very poorly controlled type 2 diabetes admitted with DKA most likely precipitated by an episode of watery diarrhea from colitis seen on CT scan. He was treated with IV Unasyn for his diarrhea which resolved. He was transitioned to Augmentin. He had an anion gap of 25 and a positive acetest and a bicarb of 10 on admission. His DKA resolved with an insulin drip and he was started on Lantus 10 units at night with sliding scale. He required approximately 2 units of sliding scale insulin with each meal. He was sent home on Lantus 10 units every morning and to continue his home metformin. His Trulicity was stopped because it had given him GI symptoms and he did not wish to take it anymore.  His Jardiance was also discontinued because of his DKA. It was unclear whether he has type I or type 2 diabetes or a mixed type I-type II picture. His C-peptide level was normal. He was seen by the diabetic educator and given a follow-up appointment with them"  Diagnostic Tests Reviewed: CLINICAL DATA:  Extreme fatigue. Weight loss. Dizziness. History of  diabetes and recent COVID-19 infection.   EXAM:  CT HEAD WITHOUT CONTRAST   TECHNIQUE:  Contiguous axial images were obtained from the base of the skull  through the vertex  without intravenous contrast.   COMPARISON:  None.   FINDINGS:  Brain: There is no evidence of an acute infarct, intracranial  hemorrhage, mass, midline shift, or extra-axial fluid collection.  The ventricles and sulci are normal.   Vascular: No hyperdense vessel.   Skull: No fracture or suspicious osseous lesion.   Sinuses/Orbits: Visualized paranasal sinuses and mastoid air cells  are clear. Unremarkable orbits.   CLINICAL DATA:  Dyspnea, fatigue, polyuria, hyperglycemia   EXAM:  PORTABLE CHEST 1 VIEW   COMPARISON:  None.   FINDINGS:  The heart size and mediastinal contours are within normal limits.  Both lungs are clear. The visualized skeletal structures are  unremarkable. Disposition: Home  Consults: None  Discharge Instructions: Follow up here  Disease/illness Education: Discussed today.   Home Health/Community Services Discussions/Referrals: N/A  Establishment or re-establishment of referral orders for community resources: N/A  Discussion with other health care providers: N/A  Assessment and Support of treatment regimen adherence: Good  Appointments Coordinated with: Patient   Education for self-management, independent living, and ADLs: Discussed today.   Since getting out of the hospital, Alexis [name] has been feeling better.   DIABETES Hypoglycemic episodes:no Polydipsia/polyuria: no Visual disturbance: no Chest pain: no Paresthesias: no Glucose Monitoring: yes  Accucheck frequency: Daily  Fasting glucose: 205-210 Taking Insulin?: yes  Long acting insulin:  Short acting insulin: Blood Pressure Monitoring: not checking Retinal Examination: Not up to Date Foot Exam: Up to Date Diabetic Education: Completed Pneumovax: Up to Date Influenza: Not up to Date Aspirin: no   Relevant past medical, surgical, family and social history reviewed and updated as indicated. Interim medical history since our last visit reviewed. Allergies and medications reviewed  and updated.  Review of Systems  Constitutional: Negative.   Respiratory: Negative.    Cardiovascular: Negative.   Gastrointestinal: Negative.   Musculoskeletal: Negative.   Psychiatric/Behavioral: Negative.     Per HPI unless specifically indicated above     Objective:    BP 137/86   Pulse 68   Temp 98.2 F (36.8 C) (Oral)   Ht 5' 7"  (1.702 m)   Wt 168 lb 12.8 oz (76.6 kg)   SpO2 98%   BMI 26.44 kg/m   Wt Readings from Last 3 Encounters:  03/23/21 168 lb 12.8 oz (76.6 kg)  03/10/21 165 lb 6 oz (75 kg)  12/09/20 168 lb 12.8 oz (76.6 kg)    Physical Exam Vitals and nursing note reviewed.  Constitutional:      General: He is not in acute distress.    Appearance: Normal appearance. He is not ill-appearing, toxic-appearing or diaphoretic.  HENT:     Head: Normocephalic and atraumatic.     Right Ear: External ear normal.     Left Ear: External ear normal.     Nose: Nose normal.     Mouth/Throat:     Mouth: Mucous membranes are moist.     Pharynx: Oropharynx is clear.  Eyes:     General: No scleral icterus.       Right eye: No discharge.        Left eye: No discharge.     Extraocular Movements: Extraocular movements intact.     Conjunctiva/sclera: Conjunctivae normal.     Pupils: Pupils are equal, round, and reactive to light.  Cardiovascular:     Rate and Rhythm: Normal rate and regular rhythm.     Pulses: Normal pulses.     Heart sounds: Normal heart sounds. No murmur heard.   No friction rub. No gallop.  Pulmonary:     Effort: Pulmonary effort is normal. No respiratory distress.     Breath sounds: Normal breath sounds. No stridor. No wheezing, rhonchi or rales.  Chest:     Chest wall: No tenderness.  Musculoskeletal:        General: Normal range of motion.     Cervical back: Normal range of motion and neck supple.  Skin:    General: Skin is warm and dry.     Capillary Refill: Capillary refill takes less than 2 seconds.     Coloration: Skin is not  jaundiced or pale.     Findings: No bruising, erythema, lesion or rash.  Neurological:     General: No focal deficit present.     Mental Status: He is alert and oriented to person, place, and time. Mental status is at baseline.  Psychiatric:        Mood and Affect: Mood normal.        Behavior: Behavior normal.        Thought Content: Thought content normal.        Judgment: Judgment normal.    Results for orders placed or performed in visit on 03/14/21  CBC w/Diff  Result Value Ref Range   WBC 14.8 (H) 3.4 - 10.8 x10E3/uL   RBC 5.93 (H) 4.14 - 5.80 x10E6/uL   Hemoglobin 16.8 13.0 - 17.7 g/dL   Hematocrit 50.4 37.5 - 51.0 %   MCV 85 79 - 97 fL   MCH 28.3 26.6 - 33.0 pg   MCHC 33.3 31.5 - 35.7 g/dL   RDW 13.1 11.6 - 15.4 %   Platelets 311 150 - 450 x10E3/uL   Neutrophils 87 Not Estab. %   Lymphs 5 Not Estab. %   Monocytes 7 Not Estab. %   Eos 0 Not Estab. %   Basos 0 Not Estab. %   Neutrophils Absolute 12.9 (H) 1.4 - 7.0 x10E3/uL   Lymphocytes Absolute 0.8 0.7 - 3.1 x10E3/uL   Monocytes Absolute 1.0 (H) 0.1 - 0.9 x10E3/uL   EOS (ABSOLUTE) 0.0 0.0 - 0.4 x10E3/uL   Basophils Absolute 0.0 0.0 - 0.2 x10E3/uL   Immature Granulocytes 1 Not Estab. %   Immature Grans (Abs) 0.1 0.0 - 0.1 x10E3/uL  Comp Met (CMET)  Result Value Ref Range   Glucose 171 (H) 65 - 99 mg/dL   BUN 21 (H) 6 - 20 mg/dL   Creatinine, Ser 0.94 0.76 - 1.27 mg/dL   eGFR 107 >59 mL/min/1.73   BUN/Creatinine Ratio 22 (H) 9 - 20   Sodium 135 134 - 144 mmol/L   Potassium 4.7 3.5 - 5.2 mmol/L   Chloride 97 96 - 106 mmol/L   CO2 12 (L) 20 - 29 mmol/L   Calcium 9.4 8.7 - 10.2 mg/dL   Total Protein 7.3 6.0 - 8.5 g/dL   Albumin 5.0 4.0 - 5.0 g/dL   Globulin, Total 2.3  1.5 - 4.5 g/dL   Albumin/Globulin Ratio 2.2 1.2 - 2.2   Bilirubin Total 0.4 0.0 - 1.2 mg/dL   Alkaline Phosphatase 102 44 - 121 IU/L   AST 15 0 - 40 IU/L   ALT <5 0 - 44 IU/L  Rocky mtn spotted fvr abs pnl(IgG+IgM)  Result Value Ref Range    RMSF IgG Negative Negative   RMSF IgM 0.61 0.00 - 0.89 index  Lyme Disease Serology w/Reflex  Result Value Ref Range   Lyme Total Antibody EIA Negative Negative      Assessment & Plan:   Problem List Items Addressed This Visit       Endocrine   Uncontrolled type 2 diabetes mellitus ([place]) - Primary    Still running high in the AM. Will increase his insulin to 15units and recheck 1 week. Continue meformin. BMP checked today. Await results.       Relevant Medications   insulin glargine (LANTUS) 100 UNIT/ML Solostar Pen   metFORMIN (GLUCOPHAGE) 500 MG tablet   Other Relevant Orders   Basic metabolic panel   DKA (diabetic ketoacidosis) ([place])    Resolved. Feeling much better.       Relevant Medications   insulin glargine (LANTUS) 100 UNIT/ML Solostar Pen   metFORMIN (GLUCOPHAGE) 500 MG tablet     Follow up plan: Return in about 1 week (around 03/30/2021) for video/phone visit.

## 2021-03-23 NOTE — Assessment & Plan Note (Signed)
Resolved. Feeling much better. 

## 2021-03-23 NOTE — Assessment & Plan Note (Signed)
Still running high in the AM. Will increase his insulin to 15units and recheck 1 week. Continue meformin. BMP checked today. Await results.

## 2021-03-23 NOTE — Telephone Encounter (Signed)
Requested medication (s) are due for refill today -yes  Requested medication (s) are on the active medication list -yes  Future visit scheduled -visit today  Last refill: 03/23/21  Notes to clinic: Pharmacy is requesting changes to original Rx sent in today- sent for reveiw  Requested Prescriptions  Pending Prescriptions Disp Refills   Insulin Glargine Solostar (LANTUS) 100 UNIT/ML Solostar Pen [Pharmacy Med Name: insulin glargine (LANTUS) 100 UNIT/ML Solostar Pen] 4.5 mL 2    Sig: Inject 15 Units into the skin daily.     Endocrinology:  Diabetes - Insulins Failed - 03/23/2021  1:32 PM      Failed - HBA1C is between 0 and 7.9 and within 180 days    Hemoglobin A1C  Date Value Ref Range Status  03/08/2017 6.1  Final   HB A1C (BAYER DCA - WAIVED)  Date Value Ref Range Status  03/10/2021 9.4 (H) <7.0 % Final    Comment:                                          Diabetic Adult            <7.0                                       Healthy Adult        4.3 - 5.7                                                           (DCCT/NGSP) American Diabetes Association's Summary of Glycemic Recommendations for Adults with Diabetes: Hemoglobin A1c <7.0%. More stringent glycemic goals (A1c <6.0%) may further reduce complications at the cost of increased risk of hypoglycemia.           Passed - Valid encounter within last 6 months    Recent Outpatient Visits           Today Uncontrolled type 2 diabetes mellitus with hyperglycemia [facility])   [facility] [place], Megan P, DO   1 week ago Abdominal pain, unspecified abdominal location   [facility] Larae Grooms, NP   1 week ago Uncontrolled type 2 diabetes mellitus with hyperglycemia [facility])   [facility], Megan P, DO   3 months ago Uncontrolled type 2 diabetes mellitus with hyperglycemia [facility])   [facility] [place], Megan P, DO   5 months ago Uncontrolled type 2 diabetes mellitus  with hyperglycemia [facility])   [facility] [place], Oralia Rud, DO       Future Appointments             In 1 week Laural Benes, Oralia Rud, DO [facility]   In 2 months Johnson, Megan P, DO [facility]               Requested Prescriptions  Pending Prescriptions Disp Refills   Insulin Glargine Solostar (LANTUS) 100 UNIT/ML Solostar Pen [facility] Med Name: insulin glargine (LANTUS) 100 UNIT/ML Solostar Pen] 4.5 mL 2    Sig: Inject 15 Units into the skin daily.     Endocrinology:  Diabetes - Insulins Failed - 03/23/2021  1:32 PM      Failed - HBA1C is between 0 and 7.9 and within 180 days    Hemoglobin A1C  Date Value Ref Range Status  03/08/2017 6.1  Final   HB A1C (BAYER DCA - WAIVED)  Date Value Ref Range Status  03/10/2021 9.4 (H) <7.0 % Final    Comment:                                          Diabetic Adult            <7.0                                       Healthy Adult        4.3 - 5.7                                                           (DCCT/NGSP) American Diabetes Association's Summary of Glycemic Recommendations for Adults with Diabetes: Hemoglobin A1c <7.0%. More stringent glycemic goals (A1c <6.0%) may further reduce complications at the cost of increased risk of hypoglycemia.           Passed - Valid encounter within last 6 months    Recent Outpatient Visits           Today Uncontrolled type 2 diabetes mellitus with hyperglycemia (HCC)   [facility] [place], Megan P, DO   1 week ago Abdominal pain, unspecified abdominal location   [facility] Larae Grooms, NP   1 week ago Uncontrolled type 2 diabetes mellitus with hyperglycemia [facility])   [facility], Megan P, DO   3 months ago Uncontrolled type 2 diabetes mellitus with hyperglycemia [facility])   [facility] [place], Megan P, DO   5 months ago Uncontrolled type 2 diabetes mellitus with  hyperglycemia [facility])   [facility] [place], Oralia Rud, DO       Future Appointments             In 1 week Laural Benes, Oralia Rud, DO [facility]   In 2 months Laural Benes, Oralia Rud, DO [facility]

## 2021-03-24 LAB — BASIC METABOLIC PANEL
BUN/Creatinine Ratio: 19 (ref 9–20)
BUN: 13 mg/dL (ref 6–20)
CO2: 25 mmol/L (ref 20–29)
Calcium: 8.8 mg/dL (ref 8.7–10.2)
Chloride: 103 mmol/L (ref 96–106)
Creatinine, Ser: 0.68 mg/dL — ABNORMAL LOW (ref 0.76–1.27)
Glucose: 159 mg/dL — ABNORMAL HIGH (ref 65–99)
Potassium: 4.1 mmol/L (ref 3.5–5.2)
Sodium: 141 mmol/L (ref 134–144)
eGFR: 123 mL/min/{1.73_m2} (ref >59)

## 2021-03-31 ENCOUNTER — Telehealth: Payer: 59 | Admitting: Family Medicine

## 2021-04-16 DIAGNOSIS — S065XAA Traumatic subdural hemorrhage with loss of consciousness status unknown, initial encounter: Secondary | ICD-10-CM | POA: Insufficient documentation

## 2021-04-16 DIAGNOSIS — S065X9A Traumatic subdural hemorrhage with loss of consciousness of unspecified duration, initial encounter: Secondary | ICD-10-CM | POA: Insufficient documentation

## 2021-04-17 DIAGNOSIS — S066X0A Traumatic subarachnoid hemorrhage without loss of consciousness, initial encounter: Secondary | ICD-10-CM

## 2021-04-17 HISTORY — DX: Traumatic subarachnoid hemorrhage without loss of consciousness, initial encounter: S06.6X0A

## 2021-05-24 ENCOUNTER — Other Ambulatory Visit: Payer: Self-pay

## 2021-05-24 ENCOUNTER — Ambulatory Visit (INDEPENDENT_AMBULATORY_CARE_PROVIDER_SITE_OTHER): Payer: 59 | Admitting: Family Medicine

## 2021-05-24 ENCOUNTER — Encounter: Payer: Self-pay | Admitting: Family Medicine

## 2021-05-24 VITALS — BP 127/88 | HR 90 | Wt 152.0 lb

## 2021-05-24 DIAGNOSIS — E1065 Type 1 diabetes mellitus with hyperglycemia: Secondary | ICD-10-CM | POA: Diagnosis not present

## 2021-05-24 DIAGNOSIS — S02111A Type II occipital condyle fracture, initial encounter for closed fracture: Secondary | ICD-10-CM

## 2021-05-24 DIAGNOSIS — R569 Unspecified convulsions: Secondary | ICD-10-CM | POA: Diagnosis not present

## 2021-05-24 DIAGNOSIS — R41841 Cognitive communication deficit: Secondary | ICD-10-CM | POA: Diagnosis not present

## 2021-05-24 DIAGNOSIS — T8189XA Other complications of procedures, not elsewhere classified, initial encounter: Secondary | ICD-10-CM

## 2021-05-24 DIAGNOSIS — A0472 Enterocolitis due to Clostridium difficile, not specified as recurrent: Secondary | ICD-10-CM

## 2021-05-24 DIAGNOSIS — S069X0A Unspecified intracranial injury without loss of consciousness, initial encounter: Secondary | ICD-10-CM

## 2021-05-24 DIAGNOSIS — M5431 Sciatica, right side: Secondary | ICD-10-CM

## 2021-05-24 DIAGNOSIS — S82874A Nondisplaced pilon fracture of right tibia, initial encounter for closed fracture: Secondary | ICD-10-CM

## 2021-05-24 DIAGNOSIS — S32591A Other specified fracture of right pubis, initial encounter for closed fracture: Secondary | ICD-10-CM

## 2021-05-24 DIAGNOSIS — D72829 Elevated white blood cell count, unspecified: Secondary | ICD-10-CM

## 2021-05-24 DIAGNOSIS — F3181 Bipolar II disorder: Secondary | ICD-10-CM

## 2021-05-24 DIAGNOSIS — G8911 Acute pain due to trauma: Secondary | ICD-10-CM

## 2021-05-24 DIAGNOSIS — S0181XA Laceration without foreign body of other part of head, initial encounter: Secondary | ICD-10-CM

## 2021-05-24 DIAGNOSIS — S066X0A Traumatic subarachnoid hemorrhage without loss of consciousness, initial encounter: Secondary | ICD-10-CM

## 2021-05-24 DIAGNOSIS — S3130XA Unspecified open wound of scrotum and testes, initial encounter: Secondary | ICD-10-CM

## 2021-05-24 DIAGNOSIS — S065X0A Traumatic subdural hemorrhage without loss of consciousness, initial encounter: Secondary | ICD-10-CM

## 2021-05-24 DIAGNOSIS — H9192 Unspecified hearing loss, left ear: Secondary | ICD-10-CM

## 2021-05-24 DIAGNOSIS — S069XAA Unspecified intracranial injury with loss of consciousness status unknown, initial encounter: Secondary | ICD-10-CM | POA: Insufficient documentation

## 2021-05-24 HISTORY — DX: Other specified fracture of right pubis, initial encounter for closed fracture: S32.591A

## 2021-05-24 LAB — URINALYSIS, ROUTINE W REFLEX MICROSCOPIC
Bilirubin, UA: NEGATIVE
Ketones, UA: NEGATIVE
Leukocytes,UA: NEGATIVE
Nitrite, UA: NEGATIVE
Protein,UA: NEGATIVE
RBC, UA: NEGATIVE
Specific Gravity, UA: 1.025 (ref 1.005–1.030)
Urobilinogen, Ur: 0.2 mg/dL (ref 0.2–1.0)
pH, UA: 5.5 (ref 5.0–7.5)

## 2021-05-24 MED ORDER — MUPIROCIN 2 % EX OINT: 1.0000 "application " | TOPICAL_OINTMENT | Freq: Two times a day (BID) | CUTANEOUS | 0 refills | Status: DC

## 2021-05-24 MED ORDER — INSULIN GLARGINE SOLOSTAR 100 UNIT/ML ~~LOC~~ SOPN: 20.0000 [IU] | PEN_INJECTOR | Freq: Two times a day (BID) | SUBCUTANEOUS | 3 refills | Status: DC

## 2021-05-24 MED ORDER — METFORMIN HCL 500 MG PO TABS: 1000.0000 mg | ORAL_TABLET | Freq: Two times a day (BID) | ORAL | 1 refills | Status: DC

## 2021-05-24 MED ORDER — BACLOFEN 10 MG PO TABS
10.0000 mg | ORAL_TABLET | Freq: Every evening | ORAL | 1 refills | Status: DC | PRN
Start: 2021-05-24 — End: 2021-06-12

## 2021-05-24 MED ORDER — ATORVASTATIN CALCIUM 40 MG PO TABS: 40.0000 mg | ORAL_TABLET | Freq: Every day | ORAL | 1 refills | Status: DC

## 2021-05-24 MED ORDER — PANTOPRAZOLE SODIUM 40 MG PO TBEC: 40.0000 mg | DELAYED_RELEASE_TABLET | Freq: Every day | ORAL | 1 refills | Status: DC

## 2021-05-24 NOTE — Assessment & Plan Note (Signed)
Was very agitated in the hospital. Doing better now. Encouraged use of zyprexa for difficulty sleeping and agitation. Continue to monitor. Was suppose to have psychiatry consult at acute rehab. It does not appear that this was done. Will arrange referral today. Await their input.

## 2021-05-24 NOTE — Progress Notes (Signed)
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 closed, initial encounter [facility])    Following with  ortho. Due to see them again next week.         Genitourinary   Rupture of testis    S/p orchiopexy 05/03/21. Has a follow up with urology in January with follow up US. Urine today showed no sign of blood. Continue to monitor.         Other   Bipolar 2 disorder (HCC)    Was very agitated in the hospital. Doing better now. Encouraged use of zyprexa for difficulty sleeping and agitation. Continue to monitor. Was suppose to have psychiatry consult at acute rehab. It does not appear that this was done. Will arrange referral today. Await their input.       Relevant Orders   Ambulatory referral to Psychiatry   Cognitive communication deficit   Relevant Orders   Ambulatory referral to Neurology   Ambulatory referral to Psychiatry   Seizures [facility])    Has been on depakote. Does not have a scheduled neurology follow up- will get that set up ASAP.       Relevant Medications   divalproex  (DEPAKOTE) 250 MG DR tablet   gabapentin (NEURONTIN) 100 MG capsule   Other Relevant Orders   Ambulatory referral to Neurology   Other Visit Diagnoses     Problem involving surgical incision       No sign of infection but some irritation- will start bactroban. Call with any concerns. Continue to monitor.    Sciatica of right side       Likely due to cast disrupting gait. Will start baclofen and stretches. Advised pain management discussion with ortho. Continue to monitor.    Relevant Medications   divalproex (DEPAKOTE) 250 MG DR tablet   gabapentin (NEURONTIN) 100 MG capsule   hydrOXYzine (VISTARIL) 25 MG capsule   OLANZapine zydis (ZYPREXA) 5 MG disintegrating tablet   baclofen (LIORESAL) 10 MG tablet   Facial laceration, initial encounter       Resolved. Managed by plastic surgery.    Leukocytosis, unspecified type       Likely due to TBI/c diff. Will recheck today.   Acute traumatic pain       Besides sciatica, doing pretty well. Continue to follow with ortho. Call with any concerns.    Hypomagnesemia       Rechecking labs today. Await results. Treat as needed.         Follow up plan: Return in about 4 weeks (around 06/21/2021).  >1.5 hour spent with patient and chart review today

## 2021-05-24 NOTE — Assessment & Plan Note (Signed)
Had been following with neurosurgery. No scheduled follow up outpatient- we will try to get this set up ASAP as he is supposed to have repeat imaging done prior to his c-collar removal done on 05/29/21.

## 2021-05-24 NOTE — Assessment & Plan Note (Signed)
S/p orchiopexy 05/03/21. Has a follow up with urology in January with follow up US. Urine today showed no sign of blood. Continue to monitor.

## 2021-05-24 NOTE — Assessment & Plan Note (Signed)
Following with ortho. Due to see them next week. They are managing pain right now. Not having any PT right now- likely would benefit when his cast has been removed.

## 2021-05-24 NOTE — Assessment & Plan Note (Addendum)
S/p PO vancomycin. No diarrhea. Will recheck CBC. Await results. Continue to monitor.

## 2021-05-24 NOTE — Assessment & Plan Note (Signed)
Following with ortho. Due to see them again next week.

## 2021-05-24 NOTE — Assessment & Plan Note (Addendum)
Was very agitated in the hospital. Doing better now. Encouraged use of zyprexa for difficulty sleeping and agitation. Continue to monitor. Was suppose to have psychiatry consult at acute rehab. It does not appear that this was done. Will arrange referral today. Await their input.  

## 2021-05-24 NOTE — Assessment & Plan Note (Signed)
Had been following with neurosurgery. No scheduled follow up outpatient- we will try to get this set up ASAP as he is supposed to have repeat imaging done prior to his c-collar removal done on 05/29/21. 

## 2021-05-24 NOTE — Assessment & Plan Note (Signed)
Has been taking 1000mg  metformin BID and lantus 20mg  BID. Will continue this. Continue checking sugars 3x a day. A1c 9.7. Will plan on rechecking his A1c in November when he is due. Goal of keeping sugars under best control for better healing.

## 2021-05-24 NOTE — Assessment & Plan Note (Signed)
Saw ENT in the hospital. Has a follow up scheduled for 11/9 with audiogram. Continue to monitor.

## 2021-05-24 NOTE — Assessment & Plan Note (Signed)
Has been on depakote. Does not have a scheduled neurology follow up- will get that set up ASAP.

## 2021-05-24 NOTE — Assessment & Plan Note (Addendum)
Had been following with neurosurgery. No scheduled follow up outpatient- we will try to get this set up ASAP as he is supposed to have repeat imaging done prior to his c-collar removal done on 05/29/21. 

## 2021-05-25 LAB — CBC WITH DIFFERENTIAL/PLATELET
Basophils Absolute: 0.1 10*3/uL (ref 0.0–0.2)
Basos: 1 %
EOS (ABSOLUTE): 0.2 10*3/uL (ref 0.0–0.4)
Eos: 3 %
Hematocrit: 38.7 % (ref 37.5–51.0)
Hemoglobin: 12.4 g/dL — ABNORMAL LOW (ref 13.0–17.7)
Immature Grans (Abs): 0 10*3/uL (ref 0.0–0.1)
Immature Granulocytes: 1 %
Lymphocytes Absolute: 1.9 10*3/uL (ref 0.7–3.1)
Lymphs: 30 %
MCH: 28.2 pg (ref 26.6–33.0)
MCHC: 32 g/dL (ref 31.5–35.7)
MCV: 88 fL (ref 79–97)
Monocytes Absolute: 0.5 10*3/uL (ref 0.1–0.9)
Monocytes: 8 %
Neutrophils Absolute: 3.7 10*3/uL (ref 1.4–7.0)
Neutrophils: 57 %
Platelets: 390 10*3/uL (ref 150–450)
RBC: 4.39 x10E6/uL (ref 4.14–5.80)
RDW: 12.6 % (ref 11.6–15.4)
WBC: 6.4 10*3/uL (ref 3.4–10.8)

## 2021-05-25 LAB — COMPREHENSIVE METABOLIC PANEL
ALT: 1 IU/L (ref 0–44)
AST: 29 IU/L (ref 0–40)
Albumin/Globulin Ratio: 1.7 (ref 1.2–2.2)
Albumin: 4.4 g/dL (ref 4.0–5.0)
Alkaline Phosphatase: 242 IU/L — ABNORMAL HIGH (ref 44–121)
BUN/Creatinine Ratio: 34 — ABNORMAL HIGH (ref 9–20)
BUN: 24 mg/dL — ABNORMAL HIGH (ref 6–20)
Bilirubin Total: <0.2 mg/dL (ref 0.0–1.2)
CO2: 24 mmol/L (ref 20–29)
Calcium: 9.3 mg/dL (ref 8.7–10.2)
Chloride: 101 mmol/L (ref 96–106)
Creatinine, Ser: 0.7 mg/dL — ABNORMAL LOW (ref 0.76–1.27)
Globulin, Total: 2.6 g/dL (ref 1.5–4.5)
Glucose: 305 mg/dL — ABNORMAL HIGH (ref 70–99)
Potassium: 4.7 mmol/L (ref 3.5–5.2)
Sodium: 141 mmol/L (ref 134–144)
Total Protein: 7 g/dL (ref 6.0–8.5)
eGFR: 122 mL/min/{1.73_m2} (ref >59)

## 2021-05-25 LAB — MAGNESIUM: Magnesium: 1.8 mg/dL (ref 1.6–2.3)

## 2021-05-29 ENCOUNTER — Encounter: Payer: Self-pay | Admitting: Family Medicine

## 2021-06-12 ENCOUNTER — Telehealth (INDEPENDENT_AMBULATORY_CARE_PROVIDER_SITE_OTHER): Payer: 59 | Admitting: Family Medicine

## 2021-06-12 ENCOUNTER — Encounter: Payer: Self-pay | Admitting: Family Medicine

## 2021-06-12 DIAGNOSIS — S82874D Nondisplaced pilon fracture of right tibia, subsequent encounter for closed fracture with routine healing: Secondary | ICD-10-CM

## 2021-06-12 DIAGNOSIS — M5431 Sciatica, right side: Secondary | ICD-10-CM | POA: Diagnosis not present

## 2021-06-12 DIAGNOSIS — S82874A Nondisplaced pilon fracture of right tibia, initial encounter for closed fracture: Secondary | ICD-10-CM

## 2021-06-12 MED ORDER — BACLOFEN 10 MG PO TABS: 10.0000 mg | ORAL_TABLET | Freq: Two times a day (BID) | ORAL | 3 refills | Status: DC

## 2021-06-12 MED ORDER — GABAPENTIN 400 MG PO CAPS: 800.0000 mg | ORAL_CAPSULE | Freq: Three times a day (TID) | ORAL | 3 refills | Status: DC

## 2021-06-12 MED ORDER — OXYCODONE HCL 5 MG PO TABS: 5.0000 mg | ORAL_TABLET | Freq: Four times a day (QID) | ORAL | 0 refills | Status: DC | PRN
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
## 2021-06-13 NOTE — Progress Notes (Signed)
Called LMOM to inform patient to call the office and cancel aptps and reschedule for 2 weeks.

## 2021-06-13 NOTE — Progress Notes (Signed)
LMOM to inform pt to call the office and reschedule/canceled 06/16/2021 apt per provider.

## 2021-06-14 NOTE — Progress Notes (Signed)
Lmom asking pt to call back to schedule 2 week follow up

## 2021-06-16 ENCOUNTER — Ambulatory Visit: Payer: 59 | Admitting: Family Medicine

## 2021-06-16 NOTE — Progress Notes (Signed)
Lmom for pt to schedule f/u appointment.  (Return in about 6 weeks (around 07/05/2021) for Anxiety and LOC)

## 2021-06-17 NOTE — Assessment & Plan Note (Signed)
Due to follow up with his orthopedist in 10 days. Not doing any PT at this time. Will give pain medicine to bridge until he gets into see ortho. Advised them to try to get in with pain management at [facility], if they can't see him, we will refer him to preferred pain management. Increase gabapentin to 800mg  TID and baclofen BID. Continue to monitor closely. Call with any concerns.

## 2021-06-21 ENCOUNTER — Ambulatory Visit: Payer: 59 | Admitting: Family Medicine

## 2021-06-23 ENCOUNTER — Encounter: Payer: Self-pay | Admitting: Family Medicine

## 2021-06-27 NOTE — Telephone Encounter (Signed)
Appointment scheduled.

## 2021-06-27 NOTE — Telephone Encounter (Signed)
Appt please

## 2021-06-28 ENCOUNTER — Other Ambulatory Visit: Payer: Self-pay

## 2021-06-28 ENCOUNTER — Telehealth (INDEPENDENT_AMBULATORY_CARE_PROVIDER_SITE_OTHER): Payer: 59 | Admitting: Family Medicine

## 2021-06-28 ENCOUNTER — Encounter: Payer: Self-pay | Admitting: Family Medicine

## 2021-06-28 DIAGNOSIS — G8921 Chronic pain due to trauma: Secondary | ICD-10-CM | POA: Diagnosis not present

## 2021-06-28 DIAGNOSIS — S02111D Type II occipital condyle fracture, subsequent encounter for fracture with routine healing: Secondary | ICD-10-CM

## 2021-06-28 DIAGNOSIS — H9192 Unspecified hearing loss, left ear: Secondary | ICD-10-CM

## 2021-06-28 DIAGNOSIS — S82874D Nondisplaced pilon fracture of right tibia, subsequent encounter for closed fracture with routine healing: Secondary | ICD-10-CM

## 2021-06-28 DIAGNOSIS — S32591D Other specified fracture of right pubis, subsequent encounter for fracture with routine healing: Secondary | ICD-10-CM

## 2021-06-28 DIAGNOSIS — M5431 Sciatica, right side: Secondary | ICD-10-CM | POA: Diagnosis not present

## 2021-06-28 MED ORDER — OXYCODONE HCL 5 MG PO TABS: 5.0000 mg | ORAL_TABLET | Freq: Three times a day (TID) | ORAL | 0 refills | Status: DC | PRN
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
## 2021-06-28 NOTE — Assessment & Plan Note (Signed)
Would like to see local ENT. Referral placed today.

## 2021-06-29 ENCOUNTER — Ambulatory Visit: Payer: 59 | Admitting: Family Medicine

## 2021-06-30 NOTE — Progress Notes (Signed)
Lmom for patient to schedule f/u appt. 

## 2021-06-30 NOTE — Progress Notes (Signed)
Lmom asking pt to call back to schedule follow up appt. 

## 2021-07-04 NOTE — Telephone Encounter (Signed)
Kathie Rhodes from [facility] called saying patient declined to be seen there.  They can not bill his health insurance and he would be self pay there.  CB# [phone]

## 2021-07-10 ENCOUNTER — Other Ambulatory Visit: Payer: Self-pay

## 2021-07-10 ENCOUNTER — Ambulatory Visit: Payer: 59 | Admitting: Family Medicine

## 2021-07-17 ENCOUNTER — Other Ambulatory Visit: Payer: Self-pay

## 2021-07-17 MED ORDER — PEN NEEDLES 30G X 5 MM MISC: 1.0000 | Freq: Two times a day (BID) | 6 refills | Status: DC

## 2021-07-24 ENCOUNTER — Encounter: Payer: Self-pay | Admitting: Family Medicine

## 2021-07-24 ENCOUNTER — Other Ambulatory Visit: Payer: Self-pay

## 2021-07-24 ENCOUNTER — Ambulatory Visit (INDEPENDENT_AMBULATORY_CARE_PROVIDER_SITE_OTHER): Payer: 59 | Admitting: Family Medicine

## 2021-07-24 VITALS — BP 114/76 | HR 91 | Temp 97.3°F | Wt 153.2 lb

## 2021-07-24 DIAGNOSIS — S32591A Other specified fracture of right pubis, initial encounter for closed fracture: Secondary | ICD-10-CM | POA: Diagnosis not present

## 2021-07-24 DIAGNOSIS — E1065 Type 1 diabetes mellitus with hyperglycemia: Secondary | ICD-10-CM | POA: Diagnosis not present

## 2021-07-24 DIAGNOSIS — F3181 Bipolar II disorder: Secondary | ICD-10-CM

## 2021-07-24 DIAGNOSIS — G8921 Chronic pain due to trauma: Secondary | ICD-10-CM | POA: Diagnosis not present

## 2021-07-24 DIAGNOSIS — S82874D Nondisplaced pilon fracture of right tibia, subsequent encounter for closed fracture with routine healing: Secondary | ICD-10-CM | POA: Diagnosis not present

## 2021-07-24 LAB — BAYER DCA HB A1C WAIVED: HB A1C (BAYER DCA - WAIVED): 7.4 % — ABNORMAL HIGH (ref 4.8–5.6)

## 2021-07-24 MED ORDER — INSULIN GLARGINE SOLOSTAR 100 UNIT/ML ~~LOC~~ SOPN
20.0000 [IU] | PEN_INJECTOR | Freq: Every day | SUBCUTANEOUS | 3 refills | Status: DC
Start: 2021-07-24 — End: 2021-08-21

## 2021-07-24 MED ORDER — OXYCODONE HCL 5 MG PO TABS
5.0000 mg | ORAL_TABLET | Freq: Three times a day (TID) | ORAL | 0 refills | Status: DC | PRN
Start: 2021-07-28 — End: 2021-08-21

## 2021-07-24 MED ORDER — QUETIAPINE FUMARATE 25 MG PO TABS: 12.5000 mg | ORAL_TABLET | Freq: Every evening | ORAL | 1 refills | Status: DC | PRN

## 2021-07-24 NOTE — Progress Notes (Signed)
BP 114/76   Pulse 91   Temp (!) 97.3 F (36.3 C)   Wt 153 lb 3.2 oz (69.5 kg)   SpO2 97%   BMI 23.99 kg/m    Subjective:    Patient ID: Alexis [name], male    DOB: 1985/08/19, 35 y.o.   MRN: [id number]  HPI: JARIOUS LYON [name] is a 35 y.o. male  Chief Complaint  Patient presents with   Diabetes   DIABETES Hypoglycemic episodes:yes Polydipsia/polyuria: no Visual disturbance: no Chest pain: no Paresthesias: no Glucose Monitoring: yes  Accucheck frequency: Daily  Fasting glucose: 120s Taking Insulin?: yes Blood Pressure Monitoring: not checking Retinal Examination: Not up to Date Foot Exam: Up to Date Diabetic Education: Completed Pneumovax: Up to Date Influenza: Up to Date Aspirin: yes  CHRONIC PAIN  Present dose: 22.5-45 Morphine equivalents Pain control status: better Duration: 3 months Location: R leg and buttock Quality: aching and sore Current Pain Level: moderate to severe Previous Pain Level: severe Breakthrough pain: yes Benefit from narcotic medications: yes What Activities task can be accomplished with current medication? Able to do his ADLs Interested in weaning off narcotics:yes   Stool softners/OTC fiber: yes  Previous pain specialty evaluation: no Non-narcotic analgesic meds: yes Narcotic contract: yes  Relevant past medical, surgical, family and social history reviewed and updated as indicated. Interim medical history since our last visit reviewed. Allergies and medications reviewed and updated.  Review of Systems  Constitutional: Negative.   Respiratory: Negative.    Cardiovascular: Negative.   Musculoskeletal:  Positive for arthralgias, back pain and myalgias. Negative for gait problem, joint swelling, neck pain and neck stiffness.  Neurological: Negative.   Psychiatric/Behavioral:  Positive for sleep disturbance. Negative for agitation, behavioral problems, confusion, decreased concentration, dysphoric mood, hallucinations,  self-injury and suicidal ideas. The patient is not nervous/anxious and is not hyperactive.    Per HPI unless specifically indicated above     Objective:    BP 114/76   Pulse 91   Temp (!) 97.3 F (36.3 C)   Wt 153 lb 3.2 oz (69.5 kg)   SpO2 97%   BMI 23.99 kg/m   Wt Readings from Last 3 Encounters:  07/24/21 153 lb 3.2 oz (69.5 kg)  05/24/21 152 lb (68.9 kg)  03/23/21 168 lb 12.8 oz (76.6 kg)    Physical Exam Vitals and nursing note reviewed.  Constitutional:      General: He is not in acute distress.    Appearance: Normal appearance. He is not ill-appearing, toxic-appearing or diaphoretic.  HENT:     Head: Normocephalic and atraumatic.     Right Ear: External ear normal.     Left Ear: External ear normal.     Nose: Nose normal.     Mouth/Throat:     Mouth: Mucous membranes are moist.     Pharynx: Oropharynx is clear.  Eyes:     General: No scleral icterus.       Right eye: No discharge.        Left eye: No discharge.     Extraocular Movements: Extraocular movements intact.     Conjunctiva/sclera: Conjunctivae normal.     Pupils: Pupils are equal, round, and reactive to light.  Cardiovascular:     Rate and Rhythm: Normal rate and regular rhythm.     Pulses: Normal pulses.     Heart sounds: Normal heart sounds. No murmur heard.   No friction rub. No gallop.  Pulmonary:     Effort:  Pulmonary effort is normal. No respiratory distress.     Breath sounds: Normal breath sounds. No stridor. No wheezing, rhonchi or rales.  Chest:     Chest wall: No tenderness.  Musculoskeletal:        General: Normal range of motion.     Cervical back: Normal range of motion and neck supple.     Comments: Walking boot on R leg  Skin:    General: Skin is warm and dry.     Capillary Refill: Capillary refill takes less than 2 seconds.     Coloration: Skin is not jaundiced or pale.     Findings: No bruising, erythema, lesion or rash.  Neurological:     General: No focal deficit  present.     Mental Status: He is alert and oriented to person, place, and time. Mental status is at baseline.  Psychiatric:        Mood and Affect: Mood normal.        Behavior: Behavior normal.        Thought Content: Thought content normal.        Judgment: Judgment normal.    Results for orders placed or performed in visit on 07/24/21  Comprehensive metabolic panel  Result Value Ref Range   Glucose 152 (H) 70 - 99 mg/dL   BUN 7 6 - 20 mg/dL   Creatinine, Ser 0.63 (L) 0.76 - 1.27 mg/dL   eGFR 126 >59 mL/min/1.73   BUN/Creatinine Ratio 11 9 - 20   Sodium 142 134 - 144 mmol/L   Potassium 4.4 3.5 - 5.2 mmol/L   Chloride 103 96 - 106 mmol/L   CO2 26 20 - 29 mmol/L   Calcium 9.3 8.7 - 10.2 mg/dL   Total Protein 6.3 6.0 - 8.5 g/dL   Albumin 4.5 4.0 - 5.0 g/dL   Globulin, Total 1.8 1.5 - 4.5 g/dL   Albumin/Globulin Ratio 2.5 (H) 1.2 - 2.2   Bilirubin Total <0.2 0.0 - 1.2 mg/dL   Alkaline Phosphatase 110 44 - 121 IU/L   AST 11 0 - 40 IU/L   ALT <5 0 - 44 IU/L  Bayer DCA Hb A1c Waived  Result Value Ref Range   HB A1C (BAYER DCA - WAIVED) 7.4 (H) 4.8 - 5.6 %  CBC with Differential/Platelet  Result Value Ref Range   WBC 6.3 3.4 - 10.8 x10E3/uL   RBC 5.04 4.14 - 5.80 x10E6/uL   Hemoglobin 14.0 13.0 - 17.7 g/dL   Hematocrit 41.8 37.5 - 51.0 %   MCV 83 79 - 97 fL   MCH 27.8 26.6 - 33.0 pg   MCHC 33.5 31.5 - 35.7 g/dL   RDW 13.7 11.6 - 15.4 %   Platelets 317 150 - 450 x10E3/uL   Neutrophils 52 Not Estab. %   Lymphs 40 Not Estab. %   Monocytes 6 Not Estab. %   Eos 1 Not Estab. %   Basos 1 Not Estab. %   Neutrophils Absolute 3.3 1.4 - 7.0 x10E3/uL   Lymphocytes Absolute 2.6 0.7 - 3.1 x10E3/uL   Monocytes Absolute 0.4 0.1 - 0.9 x10E3/uL   EOS (ABSOLUTE) 0.1 0.0 - 0.4 x10E3/uL   Basophils Absolute 0.1 0.0 - 0.2 x10E3/uL   Immature Granulocytes 0 Not Estab. %   Immature Grans (Abs) 0.0 0.0 - 0.1 x10E3/uL  Lipid Panel w/o Chol/HDL Ratio  Result Value Ref Range   Cholesterol,  Total 122 100 - 199 mg/dL   Triglycerides 134 0 - 149 mg/dL  HDL 40 >39 mg/dL   VLDL Cholesterol Cal 24 5 - 40 mg/dL   LDL Chol Calc (NIH) 58 0 - 99 mg/dL      Assessment & Plan:   Problem List Items Addressed This Visit       Endocrine   Uncontrolled type 1 diabetes mellitus with hyperglycemia, with long-term current use of insulin ([place]) - Primary    Doing much better with A1c of 7.4. Having trouble remembering his 2nd dose of insulin. Will keep on 1 dose of 20units and have him keep an eye on his sugars. Recheck 1 month. Call with any concerns. Continue to monitor.       Relevant Medications   Insulin Glargine Solostar (LANTUS) 100 UNIT/ML Solostar Pen   Other Relevant Orders   Comprehensive metabolic panel (Completed)   Bayer DCA Hb A1c Waived (Completed)   CBC with Differential/Platelet (Completed)   Lipid Panel w/o Chol/HDL Ratio (Completed)     Musculoskeletal and Integument   Closed nondisplaced pilon fracture of right tibia    Will continue to follow with ortho. Pain medicine refilled today. Discussed that we can prescribe pain medicine for about 3 months, but goal is to get him in to see pain management. Last pain management doctor would not file insurance and he was not able to afford it. Will get him referral to [facility] pain. Call with any concerns.       Relevant Orders   Ambulatory referral to Physical Therapy   Fracture of multiple pubic rami, right, closed, initial encounter ([place])    Will continue to follow with ortho. Pain medicine refilled today. Discussed that we can prescribe pain medicine for about 3 months, but goal is to get him in to see pain management. Last pain management doctor would not file insurance and he was not able to afford it. Will get him referral to [facility] pain. Call with any concerns.       Relevant Orders   Ambulatory referral to Physical Therapy     Other   Bipolar 2 disorder ([place])    Having issues with sleep. Will restart low dose PRN  seroquel. Call with any concerns. Recheck 1 month.       Chronic traumatic pain    Will continue to follow with ortho. Pain medicine refilled today. Discussed that we can prescribe pain medicine for about 3 months, but goal is to get him in to see pain management. Last pain management doctor would not file insurance and he was not able to afford it. Will get him referral to [facility] pain. Call with any concerns.       Relevant Medications   oxyCODONE (OXY IR/ROXICODONE) 5 MG immediate release tablet (Start on 07/28/2021)   Other Relevant Orders   Ambulatory referral to Pain Clinic     Follow up plan: Return in about 4 weeks (around 08/21/2021), or before 08/27/2021.

## 2021-07-25 ENCOUNTER — Encounter: Payer: Self-pay | Admitting: Family Medicine

## 2021-07-25 LAB — CBC WITH DIFFERENTIAL/PLATELET
Basophils Absolute: 0.1 10*3/uL (ref 0.0–0.2)
Basos: 1 %
EOS (ABSOLUTE): 0.1 10*3/uL (ref 0.0–0.4)
Eos: 1 %
Hematocrit: 41.8 % (ref 37.5–51.0)
Hemoglobin: 14 g/dL (ref 13.0–17.7)
Immature Grans (Abs): 0 10*3/uL (ref 0.0–0.1)
Immature Granulocytes: 0 %
Lymphocytes Absolute: 2.6 10*3/uL (ref 0.7–3.1)
Lymphs: 40 %
MCH: 27.8 pg (ref 26.6–33.0)
MCHC: 33.5 g/dL (ref 31.5–35.7)
MCV: 83 fL (ref 79–97)
Monocytes Absolute: 0.4 10*3/uL (ref 0.1–0.9)
Monocytes: 6 %
Neutrophils Absolute: 3.3 10*3/uL (ref 1.4–7.0)
Neutrophils: 52 %
Platelets: 317 10*3/uL (ref 150–450)
RBC: 5.04 x10E6/uL (ref 4.14–5.80)
RDW: 13.7 % (ref 11.6–15.4)
WBC: 6.3 10*3/uL (ref 3.4–10.8)

## 2021-07-25 LAB — LIPID PANEL W/O CHOL/HDL RATIO
Cholesterol, Total: 122 mg/dL (ref 100–199)
HDL: 40 mg/dL (ref >39)
LDL Chol Calc (NIH): 58 mg/dL (ref 0–99)
Triglycerides: 134 mg/dL (ref 0–149)
VLDL Cholesterol Cal: 24 mg/dL (ref 5–40)

## 2021-07-25 LAB — COMPREHENSIVE METABOLIC PANEL
ALT: <5 IU/L (ref 0–44)
AST: 11 IU/L (ref 0–40)
Albumin/Globulin Ratio: 2.5 — ABNORMAL HIGH (ref 1.2–2.2)
Albumin: 4.5 g/dL (ref 4.0–5.0)
Alkaline Phosphatase: 110 IU/L (ref 44–121)
BUN/Creatinine Ratio: 11 (ref 9–20)
BUN: 7 mg/dL (ref 6–20)
Bilirubin Total: <0.2 mg/dL (ref 0.0–1.2)
CO2: 26 mmol/L (ref 20–29)
Calcium: 9.3 mg/dL (ref 8.7–10.2)
Chloride: 103 mmol/L (ref 96–106)
Creatinine, Ser: 0.63 mg/dL — ABNORMAL LOW (ref 0.76–1.27)
Globulin, Total: 1.8 g/dL (ref 1.5–4.5)
Glucose: 152 mg/dL — ABNORMAL HIGH (ref 70–99)
Potassium: 4.4 mmol/L (ref 3.5–5.2)
Sodium: 142 mmol/L (ref 134–144)
Total Protein: 6.3 g/dL (ref 6.0–8.5)
eGFR: 126 mL/min/{1.73_m2} (ref >59)

## 2021-07-25 NOTE — Assessment & Plan Note (Signed)
Will continue to follow with ortho. Pain medicine refilled today. Discussed that we can prescribe pain medicine for about 3 months, but goal is to get him in to see pain management. Last pain management doctor would not file insurance and he was not able to afford it. Will get him referral to [facility] pain. Call with any concerns.

## 2021-07-25 NOTE — Assessment & Plan Note (Signed)
Doing much better with A1c of 7.4. Having trouble remembering his 2nd dose of insulin. Will keep on 1 dose of 20units and have him keep an eye on his sugars. Recheck 1 month. Call with any concerns. Continue to monitor.

## 2021-07-25 NOTE — Assessment & Plan Note (Signed)
Having issues with sleep. Will restart low dose PRN seroquel. Call with any concerns. Recheck 1 month.

## 2021-08-12 ENCOUNTER — Other Ambulatory Visit: Payer: Self-pay | Admitting: Family Medicine

## 2021-08-12 NOTE — Telephone Encounter (Signed)
Requested medication (s) are due for refill today: has refills left at same pharm  Requested medication (s) are on the active medication list: Baclofen dose inconsistent with current med list  Future visit scheduled: 08/21/21  Notes to clinic:  Baclofen dose inconsistent and not delegated, has refills of Glucophage. Please assess.  Requested Prescriptions  Pending Prescriptions Disp Refills   metFORMIN (GLUCOPHAGE) 500 MG tablet [Pharmacy Med Name: METFORMIN 500MG TABLETS] 180 tablet 1    Sig: TAKE 2 TABLETS(1000 MG) BY MOUTH TWICE DAILY WITH A MEAL     Endocrinology:  Diabetes - Biguanides Failed - 08/12/2021  6:14 AM      Failed - Cr in normal range and within 360 days    Creatinine, Ser  Date Value Ref Range Status  07/24/2021 0.63 (L) 0.76 - 1.27 mg/dL Final   Creatinine, Urine  Date Value Ref Range Status  03/27/2015 330 mg/dL Final          Passed - HBA1C is between 0 and 7.9 and within 180 days    Hemoglobin A1C  Date Value Ref Range Status  03/08/2017 6.1  Final   HB A1C (BAYER DCA - WAIVED)  Date Value Ref Range Status  07/24/2021 7.4 (H) 4.8 - 5.6 % Final    Comment:             Prediabetes: 5.7 - 6.4          Diabetes: >6.4          Glycemic control for adults with diabetes: <7.0           Passed - eGFR in normal range and within 360 days    GFR calc Af Amer  Date Value Ref Range Status  08/15/2020 133 >59 mL/min/1.73 Final    Comment:    **In accordance with recommendations from the NKF-ASN Task force,**   [facility] is in the process of updating its eGFR calculation to the   2021 CKD-EPI creatinine equation that estimates kidney function   without a race variable.    GFR calc non Af Amer  Date Value Ref Range Status  08/15/2020 115 >59 mL/min/1.73 Final   eGFR  Date Value Ref Range Status  07/24/2021 126 >59 mL/min/1.73 Final          Passed - Valid encounter within last 6 months    Recent Outpatient Visits           2 weeks ago  Uncontrolled type 1 diabetes mellitus with hyperglycemia, with long-term current use of insulin ([place])   [place], Megan P, DO   1 month ago Chronic traumatic pain   [place], Megan P, DO   2 months ago Sciatica of right side   [employer], Megan P, DO   2 months ago Uncontrolled type 1 diabetes mellitus with hyperglycemia, with long-term current use of insulin ([place])   [place], Megan P, DO   4 months ago Uncontrolled type 2 diabetes mellitus with hyperglycemia ([place])   [place], Megan P, DO       Future Appointments             In 1 week [place], Megan P, DO [place], [facility]             baclofen (LIORESAL) 10 MG tablet [employer] Med Name: BACLOFEN 10MG TABLETS] 30 tablet     Sig: TAKE 1 TABLET(10 MG) BY MOUTH AT BEDTIME AS  NEEDED FOR MUSCLE SPASMS     Not Delegated - Analgesics:  Muscle Relaxants Failed - 08/12/2021  6:14 AM      Failed - This refill cannot be delegated      Passed - Valid encounter within last 6 months    Recent Outpatient Visits           2 weeks ago Uncontrolled type 1 diabetes mellitus with hyperglycemia, with long-term current use of insulin ([place])   [place], Megan P, DO   1 month ago Chronic traumatic pain   [place], Megan P, DO   2 months ago Sciatica of right side   [employer], Megan P, DO   2 months ago Uncontrolled type 1 diabetes mellitus with hyperglycemia, with long-term current use of insulin ([place])   [place], Megan P, DO   4 months ago Uncontrolled type 2 diabetes mellitus with hyperglycemia [facility])   [place], Megan P, DO       Future Appointments             In 1 week Wynetta Emery, Barb Merino, DO [employer], [facility]

## 2021-08-14 ENCOUNTER — Other Ambulatory Visit: Payer: Self-pay | Admitting: Family Medicine

## 2021-08-16 NOTE — Telephone Encounter (Signed)
Requested Prescriptions  Pending Prescriptions Disp Refills   metFORMIN (GLUCOPHAGE) 500 MG tablet [Pharmacy Med Name: METFORMIN 500MG TABLETS] 180 tablet 1    Sig: TAKE 2 TABLETS(1000 MG) BY MOUTH TWICE DAILY WITH A MEAL     Endocrinology:  Diabetes - Biguanides Failed - 08/14/2021 10:03 AM      Failed - Cr in normal range and within 360 days    Creatinine, Ser  Date Value Ref Range Status  07/24/2021 0.63 (L) 0.76 - 1.27 mg/dL Final   Creatinine, Urine  Date Value Ref Range Status  03/27/2015 330 mg/dL Final         Passed - HBA1C is between 0 and 7.9 and within 180 days    Hemoglobin A1C  Date Value Ref Range Status  03/08/2017 6.1  Final   HB A1C (BAYER DCA - WAIVED)  Date Value Ref Range Status  07/24/2021 7.4 (H) 4.8 - 5.6 % Final    Comment:             Prediabetes: 5.7 - 6.4          Diabetes: >6.4          Glycemic control for adults with diabetes: <7.0          Passed - eGFR in normal range and within 360 days    GFR calc Af Amer  Date Value Ref Range Status  08/15/2020 133 >59 mL/min/1.73 Final    Comment:    **In accordance with recommendations from the NKF-ASN Task force,**   [facility] is in the process of updating its eGFR calculation to the   2021 CKD-EPI creatinine equation that estimates kidney function   without a race variable.    GFR calc non Af Amer  Date Value Ref Range Status  08/15/2020 115 >59 mL/min/1.73 Final   eGFR  Date Value Ref Range Status  07/24/2021 126 >59 mL/min/1.73 Final         Passed - Valid encounter within last 6 months    Recent Outpatient Visits          3 weeks ago Uncontrolled type 1 diabetes mellitus with hyperglycemia, with long-term current use of insulin ([place])   [place], Megan P, DO   1 month ago Chronic traumatic pain   [place], Megan P, DO   2 months ago Sciatica of right side   [employer], Megan P, DO   2 months ago Uncontrolled type 1  diabetes mellitus with hyperglycemia, with long-term current use of insulin ([place])   [place], Megan P, DO   4 months ago Uncontrolled type 2 diabetes mellitus with hyperglycemia [facility])   [place], Megan P, DO      Future Appointments            In 5 days Wynetta Emery, Barb Merino, DO [employer], [facility]

## 2021-08-21 ENCOUNTER — Encounter: Payer: Self-pay | Admitting: Family Medicine

## 2021-08-21 ENCOUNTER — Ambulatory Visit (INDEPENDENT_AMBULATORY_CARE_PROVIDER_SITE_OTHER): Payer: 59 | Admitting: Family Medicine

## 2021-08-21 ENCOUNTER — Other Ambulatory Visit: Payer: Self-pay

## 2021-08-21 VITALS — BP 111/73 | HR 85 | Temp 97.7°F | Wt 154.0 lb

## 2021-08-21 DIAGNOSIS — G8921 Chronic pain due to trauma: Secondary | ICD-10-CM

## 2021-08-21 DIAGNOSIS — E1065 Type 1 diabetes mellitus with hyperglycemia: Secondary | ICD-10-CM | POA: Diagnosis not present

## 2021-08-21 MED ORDER — OXYCODONE HCL 5 MG PO TABS: 5.0000 mg | ORAL_TABLET | Freq: Three times a day (TID) | ORAL | 0 refills | Status: DC | PRN

## 2021-08-21 MED ORDER — INSULIN GLARGINE SOLOSTAR 100 UNIT/ML ~~LOC~~ SOPN: 15.0000 [IU] | PEN_INJECTOR | Freq: Every day | SUBCUTANEOUS | 0 refills | Status: DC

## 2021-08-21 MED ORDER — GABAPENTIN 400 MG PO CAPS: 800.0000 mg | ORAL_CAPSULE | Freq: Three times a day (TID) | ORAL | 3 refills | Status: DC

## 2021-08-21 NOTE — Progress Notes (Signed)
BP 111/73    Pulse 85    Temp 97.7 F (36.5 C)    Wt 154 lb (69.9 kg)    SpO2 98%    BMI 24.12 kg/m    Subjective:    Patient ID: Alexis [name], male    DOB: 05-Jun-1986, 36 y.o.   MRN: [id number]  HPI: Alexis [name] is a 36 y.o. male  Chief Complaint  Patient presents with   Diabetes   Bipolar 2 disorder ([place])   Anxiety   Nausea    Patient has been feeling nauseous and dizzy    CHRONIC PAIN- has not started PT yet  Present dose: 22.5-45 Morphine equivalents Pain control status: stable Duration: months Location: R leg and buttock Quality: aching and shooting Current Pain Level: moderate Previous Pain Level: severe Breakthrough pain: yes Benefit from narcotic medications: yes What Activities task can be accomplished with current medication? Able to sleep Interested in weaning off narcotics:yes   Stool softners/OTC fiber: yes  Previous pain specialty evaluation: yes Non-narcotic analgesic meds: yes Narcotic contract: yes  DIABETES Hypoglycemic episodes:yes Polydipsia/polyuria: no Visual disturbance: no Chest pain: no Paresthesias: no Glucose Monitoring: no  Accucheck frequency: Not Checking Taking Insulin?: yes Blood Pressure Monitoring: not checking Retinal Examination: Not up to Date Foot Exam: Not up to Date Diabetic Education: Not Completed Pneumovax: Up to Date Influenza: refused Aspirin: no  Relevant past medical, surgical, family and social history reviewed and updated as indicated. Interim medical history since our last visit reviewed. Allergies and medications reviewed and updated.  Review of Systems  Constitutional: Negative.   Respiratory: Negative.    Cardiovascular: Negative.   Gastrointestinal:  Positive for nausea. Negative for abdominal distention, abdominal pain, anal bleeding, blood in stool, constipation, diarrhea, rectal pain and vomiting.  Musculoskeletal: Negative.   Neurological: Negative.   Psychiatric/Behavioral:  Negative.     Per HPI unless specifically indicated above     Objective:    BP 111/73    Pulse 85    Temp 97.7 F (36.5 C)    Wt 154 lb (69.9 kg)    SpO2 98%    BMI 24.12 kg/m   Wt Readings from Last 3 Encounters:  08/21/21 154 lb (69.9 kg)  07/24/21 153 lb 3.2 oz (69.5 kg)  05/24/21 152 lb (68.9 kg)    Physical Exam Vitals and nursing note reviewed.  Constitutional:      General: He is not in acute distress.    Appearance: Normal appearance. He is not ill-appearing, toxic-appearing or diaphoretic.  HENT:     Head: Normocephalic and atraumatic.     Right Ear: External ear normal.     Left Ear: External ear normal.     Nose: Nose normal.     Mouth/Throat:     Mouth: Mucous membranes are moist.     Pharynx: Oropharynx is clear.  Eyes:     General: No scleral icterus.       Right eye: No discharge.        Left eye: No discharge.     Extraocular Movements: Extraocular movements intact.     Conjunctiva/sclera: Conjunctivae normal.     Pupils: Pupils are equal, round, and reactive to light.  Cardiovascular:     Rate and Rhythm: Normal rate and regular rhythm.     Pulses: Normal pulses.     Heart sounds: Normal heart sounds. No murmur heard.   No friction rub. No gallop.  Pulmonary:  Effort: Pulmonary effort is normal. No respiratory distress.     Breath sounds: Normal breath sounds. No stridor. No wheezing, rhonchi or rales.  Chest:     Chest wall: No tenderness.  Musculoskeletal:        General: Normal range of motion.     Cervical back: Normal range of motion and neck supple.  Skin:    General: Skin is warm and dry.     Capillary Refill: Capillary refill takes less than 2 seconds.     Coloration: Skin is not jaundiced or pale.     Findings: No bruising, erythema, lesion or rash.  Neurological:     General: No focal deficit present.     Mental Status: He is alert and oriented to person, place, and time. Mental status is at baseline.  Psychiatric:        Mood and  Affect: Mood normal.        Behavior: Behavior normal.        Thought Content: Thought content normal.        Judgment: Judgment normal.    Results for orders placed or performed in visit on 07/24/21  Comprehensive metabolic panel  Result Value Ref Range   Glucose 152 (H) 70 - 99 mg/dL   BUN 7 6 - 20 mg/dL   Creatinine, Ser 0.63 (L) 0.76 - 1.27 mg/dL   eGFR 126 >59 mL/min/1.73   BUN/Creatinine Ratio 11 9 - 20   Sodium 142 134 - 144 mmol/L   Potassium 4.4 3.5 - 5.2 mmol/L   Chloride 103 96 - 106 mmol/L   CO2 26 20 - 29 mmol/L   Calcium 9.3 8.7 - 10.2 mg/dL   Total Protein 6.3 6.0 - 8.5 g/dL   Albumin 4.5 4.0 - 5.0 g/dL   Globulin, Total 1.8 1.5 - 4.5 g/dL   Albumin/Globulin Ratio 2.5 (H) 1.2 - 2.2   Bilirubin Total <0.2 0.0 - 1.2 mg/dL   Alkaline Phosphatase 110 44 - 121 IU/L   AST 11 0 - 40 IU/L   ALT <5 0 - 44 IU/L  Bayer DCA Hb A1c Waived  Result Value Ref Range   HB A1C (BAYER DCA - WAIVED) 7.4 (H) 4.8 - 5.6 %  CBC with Differential/Platelet  Result Value Ref Range   WBC 6.3 3.4 - 10.8 x10E3/uL   RBC 5.04 4.14 - 5.80 x10E6/uL   Hemoglobin 14.0 13.0 - 17.7 g/dL   Hematocrit 41.8 37.5 - 51.0 %   MCV 83 79 - 97 fL   MCH 27.8 26.6 - 33.0 pg   MCHC 33.5 31.5 - 35.7 g/dL   RDW 13.7 11.6 - 15.4 %   Platelets 317 150 - 450 x10E3/uL   Neutrophils 52 Not Estab. %   Lymphs 40 Not Estab. %   Monocytes 6 Not Estab. %   Eos 1 Not Estab. %   Basos 1 Not Estab. %   Neutrophils Absolute 3.3 1.4 - 7.0 x10E3/uL   Lymphocytes Absolute 2.6 0.7 - 3.1 x10E3/uL   Monocytes Absolute 0.4 0.1 - 0.9 x10E3/uL   EOS (ABSOLUTE) 0.1 0.0 - 0.4 x10E3/uL   Basophils Absolute 0.1 0.0 - 0.2 x10E3/uL   Immature Granulocytes 0 Not Estab. %   Immature Grans (Abs) 0.0 0.0 - 0.1 x10E3/uL  Lipid Panel w/o Chol/HDL Ratio  Result Value Ref Range   Cholesterol, Total 122 100 - 199 mg/dL   Triglycerides 134 0 - 149 mg/dL   HDL 40 >39 mg/dL   VLDL Cholesterol  Cal 24 5 - 40 mg/dL   LDL Chol Calc (NIH)  58 0 - 99 mg/dL      Assessment & Plan:   Problem List Items Addressed This Visit       Endocrine   Uncontrolled type 1 diabetes mellitus with hyperglycemia, with long-term current use of insulin ([facility])    Sugars going low. Will cut insulin down to 15 units daily and recheck 1 month.      Relevant Medications   Insulin Glargine Solostar (LANTUS) 100 UNIT/ML Solostar Pen     Other   Chronic traumatic pain - Primary    Will continue to follow with ortho- due to see them again at the end of the month. Discussed that we can prescribe pain medicine for another 2 months, but goal is to get him into see pain management. Has not heard from [facility] pain- will check with referral coordinator on status of that referral. Refills given today. Call with any concerns.       Relevant Medications   oxyCODONE (OXY IR/ROXICODONE) 5 MG immediate release tablet   gabapentin (NEURONTIN) 400 MG capsule     Follow up plan: Return in about 4 weeks (around 09/18/2021).

## 2021-08-21 NOTE — Assessment & Plan Note (Signed)
Will continue to follow with ortho- due to see them again at the end of the month. Discussed that we can prescribe pain medicine for another 2 months, but goal is to get him into see pain management. Has not heard from [facility] pain- will check with referral coordinator on status of that referral. Refills given today. Call with any concerns.

## 2021-08-21 NOTE — Assessment & Plan Note (Signed)
Sugars going low. Will cut insulin down to 15 units daily and recheck 1 month.

## 2021-09-18 ENCOUNTER — Telehealth (INDEPENDENT_AMBULATORY_CARE_PROVIDER_SITE_OTHER): Payer: 59 | Admitting: Family Medicine

## 2021-09-18 ENCOUNTER — Encounter: Payer: Self-pay | Admitting: Family Medicine

## 2021-09-18 DIAGNOSIS — E1065 Type 1 diabetes mellitus with hyperglycemia: Secondary | ICD-10-CM

## 2021-09-18 DIAGNOSIS — H9192 Unspecified hearing loss, left ear: Secondary | ICD-10-CM

## 2021-09-18 DIAGNOSIS — G8921 Chronic pain due to trauma: Secondary | ICD-10-CM | POA: Diagnosis not present

## 2021-09-18 DIAGNOSIS — F3181 Bipolar II disorder: Secondary | ICD-10-CM | POA: Diagnosis not present

## 2021-09-18 MED ORDER — RISPERIDONE 0.5 MG PO TABS: 0.5000 mg | ORAL_TABLET | Freq: Every day | ORAL | 2 refills | Status: DC

## 2021-09-18 MED ORDER — OXYCODONE HCL 5 MG PO TABS: 5.0000 mg | ORAL_TABLET | Freq: Three times a day (TID) | ORAL | 0 refills | Status: DC | PRN

## 2021-09-18 NOTE — Assessment & Plan Note (Signed)
Tolerating his medicine well. Not going low. Due for A1c recheck 1 month. Continue current regimen.

## 2021-09-18 NOTE — Assessment & Plan Note (Signed)
Feeling anxious. Cannot tolerate seroquel. Will start risperidone. Recheck 1 month.

## 2021-09-18 NOTE — Assessment & Plan Note (Signed)
Did not schedule appt with ENT. Encouraged him to make appt. Call with any concerns.

## 2021-09-18 NOTE — Progress Notes (Signed)
There were no vitals taken for this visit.   Subjective:    Patient ID: Alexis [name], male    DOB: 1986/01/24, 36 y.o.   MRN: [id number]  HPI: Alexis [name] is a 36 y.o. male  Chief Complaint  Patient presents with   Depression   Diabetes   Anxiety   CHRONIC PAIN  Present dose: 22.5-45 Morphine equivalents Pain control status: stable Duration: 5 months Location: R leg and buttock Quality: aching and shooting Current Pain Level: moderate Previous Pain Level: severe Breakthrough pain: yes Benefit from narcotic medications: yes What Activities task can be accomplished with current medication? Able to sleep Interested in weaning off narcotics:yes   Stool softners/OTC fiber: no  Previous pain specialty evaluation: yes Non-narcotic analgesic meds: yes Narcotic contract: yes  BIPOLAR Mood status: uncontrolled Satisfied with current treatment?: no Symptom severity: moderate  Duration of current treatment : not on anything Side effects: groggy on seroquel Medication compliance: poor compliance Psychotherapy/counseling: no  Previous psychiatric medications: seroquel Depressed mood: no Anxious mood: yes Anhedonia: no Significant weight loss or gain: no Insomnia: no  Fatigue: yes Feelings of worthlessness or guilt: yes Impaired concentration/indecisiveness: no Suicidal ideations: no Hopelessness: no Crying spells: no Depression screen [facility] 2/9 09/18/2021 08/21/2021 03/10/2021 12/09/2020 03/20/2019  Decreased Interest 0 0 0 0 1  Down, Depressed, Hopeless 0 0 0 0 0  PHQ - 2 Score 0 0 0 0 1  Altered sleeping 3 3 - - 0  Tired, decreased energy 0 3 - - 1  Change in appetite 1 3 - - 1  Feeling bad or failure about yourself  1 - - - 0  Trouble concentrating 0 0 - - 0  Moving slowly or fidgety/restless 2 0 - - 0  Suicidal thoughts 0 0 - - 0  PHQ-9 Score 7 9 - - 3  Difficult doing work/chores Somewhat difficult Not difficult at all - - Somewhat difficult    DIABETES Hypoglycemic episodes:no Polydipsia/polyuria: yes Visual disturbance: no Chest pain: no Paresthesias: no Glucose Monitoring: yes  Accucheck frequency: Daily  Fasting glucose: 115-120 Taking Insulin?: yes Blood Pressure Monitoring: not checking Retinal Examination: Not up to Date Foot Exam: Not up to Date Diabetic Education: Completed Pneumovax: Up to Date Influenza: Up to Date Aspirin: no   Relevant past medical, surgical, family and social history reviewed and updated as indicated. Interim medical history since our last visit reviewed. Allergies and medications reviewed and updated.  Review of Systems  Constitutional: Negative.   Respiratory: Negative.    Cardiovascular: Negative.   Gastrointestinal: Negative.   Musculoskeletal:  Positive for arthralgias, back pain, gait problem and myalgias. Negative for joint swelling, neck pain and neck stiffness.  Skin: Negative.   Psychiatric/Behavioral:  Positive for sleep disturbance. Negative for agitation, behavioral problems, confusion, decreased concentration, dysphoric mood, hallucinations, self-injury and suicidal ideas. The patient is nervous/anxious. The patient is not hyperactive.    Per HPI unless specifically indicated above     Objective:    There were no vitals taken for this visit.  Wt Readings from Last 3 Encounters:  08/21/21 154 lb (69.9 kg)  07/24/21 153 lb 3.2 oz (69.5 kg)  05/24/21 152 lb (68.9 kg)    Physical Exam Vitals and nursing note reviewed.  Constitutional:      General: He is not in acute distress.    Appearance: Normal appearance. He is not ill-appearing, toxic-appearing or diaphoretic.  HENT:     Head: Normocephalic and atraumatic.  Right Ear: External ear normal.     Left Ear: External ear normal.     Nose: Nose normal.     Mouth/Throat:     Mouth: Mucous membranes are moist.     Pharynx: Oropharynx is clear.  Eyes:     General: No scleral icterus.       Right eye: No  discharge.        Left eye: No discharge.     Conjunctiva/sclera: Conjunctivae normal.     Pupils: Pupils are equal, round, and reactive to light.  Pulmonary:     Effort: Pulmonary effort is normal. No respiratory distress.     Comments: Speaking in full sentences Musculoskeletal:        General: Normal range of motion.     Cervical back: Normal range of motion.  Skin:    Coloration: Skin is not jaundiced or pale.     Findings: No bruising, erythema, lesion or rash.  Neurological:     Mental Status: He is alert and oriented to person, place, and time. Mental status is at baseline.  Psychiatric:        Mood and Affect: Mood normal.        Behavior: Behavior normal.        Thought Content: Thought content normal.        Judgment: Judgment normal.    Results for orders placed or performed in visit on 07/24/21  Comprehensive metabolic panel  Result Value Ref Range   Glucose 152 (H) 70 - 99 mg/dL   BUN 7 6 - 20 mg/dL   Creatinine, Ser 0.63 (L) 0.76 - 1.27 mg/dL   eGFR 126 >59 mL/min/1.73   BUN/Creatinine Ratio 11 9 - 20   Sodium 142 134 - 144 mmol/L   Potassium 4.4 3.5 - 5.2 mmol/L   Chloride 103 96 - 106 mmol/L   CO2 26 20 - 29 mmol/L   Calcium 9.3 8.7 - 10.2 mg/dL   Total Protein 6.3 6.0 - 8.5 g/dL   Albumin 4.5 4.0 - 5.0 g/dL   Globulin, Total 1.8 1.5 - 4.5 g/dL   Albumin/Globulin Ratio 2.5 (H) 1.2 - 2.2   Bilirubin Total <0.2 0.0 - 1.2 mg/dL   Alkaline Phosphatase 110 44 - 121 IU/L   AST 11 0 - 40 IU/L   ALT <5 0 - 44 IU/L  Bayer DCA Hb A1c Waived  Result Value Ref Range   HB A1C (BAYER DCA - WAIVED) 7.4 (H) 4.8 - 5.6 %  CBC with Differential/Platelet  Result Value Ref Range   WBC 6.3 3.4 - 10.8 x10E3/uL   RBC 5.04 4.14 - 5.80 x10E6/uL   Hemoglobin 14.0 13.0 - 17.7 g/dL   Hematocrit 41.8 37.5 - 51.0 %   MCV 83 79 - 97 fL   MCH 27.8 26.6 - 33.0 pg   MCHC 33.5 31.5 - 35.7 g/dL   RDW 13.7 11.6 - 15.4 %   Platelets 317 150 - 450 x10E3/uL   Neutrophils 52 Not Estab.  %   Lymphs 40 Not Estab. %   Monocytes 6 Not Estab. %   Eos 1 Not Estab. %   Basos 1 Not Estab. %   Neutrophils Absolute 3.3 1.4 - 7.0 x10E3/uL   Lymphocytes Absolute 2.6 0.7 - 3.1 x10E3/uL   Monocytes Absolute 0.4 0.1 - 0.9 x10E3/uL   EOS (ABSOLUTE) 0.1 0.0 - 0.4 x10E3/uL   Basophils Absolute 0.1 0.0 - 0.2 x10E3/uL   Immature Granulocytes 0 Not Estab. %  Immature Grans (Abs) 0.0 0.0 - 0.1 x10E3/uL  Lipid Panel w/o Chol/HDL Ratio  Result Value Ref Range   Cholesterol, Total 122 100 - 199 mg/dL   Triglycerides 134 0 - 149 mg/dL   HDL 40 >39 mg/dL   VLDL Cholesterol Cal 24 5 - 40 mg/dL   LDL Chol Calc (NIH) 58 0 - 99 mg/dL      Assessment & Plan:   Problem List Items Addressed This Visit       Endocrine   Uncontrolled type 1 diabetes mellitus with hyperglycemia, with long-term current use of insulin ([place])    Tolerating his medicine well. Not going low. Due for A1c recheck 1 month. Continue current regimen.         Nervous and Auditory   Hearing loss of left ear    Did not schedule appt with ENT. Encouraged him to make appt. Call with any concerns.         Other   Bipolar 2 disorder ([place])    Feeling anxious. Cannot tolerate seroquel. Will start risperidone. Recheck 1 month.       Chronic traumatic pain    Will continue to follow with ortho. Did not see them last month- due to see them 2/15. Follow up as scheduled. Doing well with PT. Discussed that we can prescribe pain medicine for another month, but that the goal is to get him in with pain management. They have not heard from them and he has not called. Encouraged him to call and to check with his orthopedist when he sees them in 9 days. Refill for 1 month given. Follow up 1 month. If not in with pain management will have to start weaning medicine next visit.       Relevant Medications   oxyCODONE (OXY IR/ROXICODONE) 5 MG immediate release tablet     Follow up plan: Return in about 4 weeks (around 10/16/2021), or In  person for pain and A1c.     This visit was completed via video visit through MyChart due to the restrictions of the COVID-19 pandemic. All issues as above were discussed and addressed. Physical exam was done as above through visual confirmation on video through MyChart. If it was felt that the patient should be evaluated in the office, they were directed there. The patient verbally consented to this visit. Location of the patient: home Location of the provider: work Those involved with this call:  Provider: Park Liter, DO CMA: Yvonna Alanis, [place] Desk/Registration: [employer]  Time spent on call:  25 minutes with patient face to face via video conference. More than 50% of this time was spent in counseling and coordination of care. 40 minutes total spent in review of patient's record and preparation of their chart.

## 2021-09-18 NOTE — Assessment & Plan Note (Signed)
Will continue to follow with ortho. Did not see them last month- due to see them 2/15. Follow up as scheduled. Doing well with PT. Discussed that we can prescribe pain medicine for another month, but that the goal is to get him in with pain management. They have not heard from them and he has not called. Encouraged him to call and to check with his orthopedist when he sees them in 9 days. Refill for 1 month given. Follow up 1 month. If not in with pain management will have to start weaning medicine next visit.

## 2021-09-19 NOTE — Progress Notes (Signed)
Lmom asking pt to call back to schedule fu appt. °

## 2021-09-20 NOTE — Progress Notes (Signed)
2nd attempt- Lmom asking pt to call back to schedule fu appt. °

## 2021-09-22 NOTE — Progress Notes (Signed)
3rd attempt- Called pt to get him scheduled for appt. Sent Mychart message

## 2021-10-11 ENCOUNTER — Other Ambulatory Visit: Payer: Self-pay | Admitting: Family Medicine

## 2021-10-11 NOTE — Telephone Encounter (Signed)
Requested medications are due for refill today.  yes ? ?Requested medications are on the active medications list.  yes ? ?Last refill. 08/21/2021 4.5 mL  0 refills ? ?Future visit scheduled.   yes ? ?Notes to clinic.  Rx was written to expire 09/20/2021 - Rx expired. ? ? ? ?Requested Prescriptions  ?Pending Prescriptions Disp Refills  ? LANTUS SOLOSTAR 100 UNIT/ML Solostar Pen [Pharmacy Med Name: LANTUS SOLOSTAR PEN INJ 3ML] 3 mL   ?  Sig: ADMINISTER 15 UNITS UNDER THE SKIN DAILY  ?  ? Endocrinology:  Diabetes - Insulins Passed - 10/11/2021  4:54 PM  ?  ?  Passed - HBA1C is between 0 and 7.9 and within 180 days  ?  Hemoglobin A1C  ?Date Value Ref Range Status  ?03/08/2017 6.1  Final  ? ?HB A1C (BAYER DCA - WAIVED)  ?Date Value Ref Range Status  ?07/24/2021 7.4 (H) 4.8 - 5.6 % Final  ?  Comment:  ?           Prediabetes: 5.7 - 6.4 ?         Diabetes: >6.4 ?         Glycemic control for adults with diabetes: <7.0 ?  ?  ?  ?  ?  Passed - Valid encounter within last 6 months  ?  Recent Outpatient Visits   ? ?      ? 3 weeks ago Hearing loss of left ear, unspecified hearing loss type  ? [facility] [place], Megan P, DO  ? 1 month ago Chronic traumatic pain  ? [facility] [place], [place] P, DO  ? 2 months ago Uncontrolled type 1 diabetes mellitus with hyperglycemia, with long-term current use of insulin (HCC)  ? [facility] [place], Megan P, DO  ? 3 months ago Chronic traumatic pain  ? [facility] [place], Megan P, DO  ? 4 months ago Sciatica of right side  ? [facility] [place], [place] P, DO  ? ?  ?  ?Future Appointments   ? ?        ? In 6 days Dorcas Carrow, DO [facility]  ? ?  ? ?  ?  ?  ?  ?

## 2021-10-12 ENCOUNTER — Other Ambulatory Visit: Payer: Self-pay | Admitting: Family Medicine

## 2021-10-12 NOTE — Telephone Encounter (Signed)
Requested Prescriptions  ?Pending Prescriptions Disp Refills  ?? LANTUS SOLOSTAR 100 UNIT/ML Solostar Pen [Pharmacy Med Name: LANTUS SOLOSTAR PEN INJ 3ML] 3 mL 1  ?  Sig: ADMINISTER 15 UNITS UNDER THE SKIN DAILY  ?  ? Endocrinology:  Diabetes - Insulins Passed - 10/12/2021 12:53 PM  ?  ?  Passed - HBA1C is between 0 and 7.9 and within 180 days  ?  Hemoglobin A1C  ?Date Value Ref Range Status  ?03/08/2017 6.1  Final  ? ?HB A1C (BAYER DCA - WAIVED)  ?Date Value Ref Range Status  ?07/24/2021 7.4 (H) 4.8 - 5.6 % Final  ?  Comment:  ?           Prediabetes: 5.7 - 6.4 ?         Diabetes: >6.4 ?         Glycemic control for adults with diabetes: <7.0 ?  ?   ?  ?  Passed - Valid encounter within last 6 months  ?  Recent Outpatient Visits   ?      ? 3 weeks ago Hearing loss of left ear, unspecified hearing loss type  ? [facility] [place], Megan P, DO  ? 1 month ago Chronic traumatic pain  ? [facility] [place], [place] P, DO  ? 2 months ago Uncontrolled type 1 diabetes mellitus with hyperglycemia, with long-term current use of insulin (HCC)  ? [facility] [place], Megan P, DO  ? 3 months ago Chronic traumatic pain  ? [facility] [place], Megan P, DO  ? 4 months ago Sciatica of right side  ? [facility] [place], [place] P, DO  ?  ?  ?Future Appointments   ?        ? In 5 days Dorcas Carrow, DO [facility]  ?  ? ?  ?  ?  ? ?

## 2021-10-17 ENCOUNTER — Encounter: Payer: Self-pay | Admitting: Family Medicine

## 2021-10-17 ENCOUNTER — Ambulatory Visit (INDEPENDENT_AMBULATORY_CARE_PROVIDER_SITE_OTHER): Payer: 59 | Admitting: Family Medicine

## 2021-10-17 ENCOUNTER — Other Ambulatory Visit: Payer: Self-pay

## 2021-10-17 VITALS — BP 113/74 | HR 80 | Temp 98.0°F | Wt 162.0 lb

## 2021-10-17 DIAGNOSIS — E1065 Type 1 diabetes mellitus with hyperglycemia: Secondary | ICD-10-CM | POA: Diagnosis not present

## 2021-10-17 DIAGNOSIS — G8921 Chronic pain due to trauma: Secondary | ICD-10-CM | POA: Diagnosis not present

## 2021-10-17 LAB — BAYER DCA HB A1C WAIVED: HB A1C (BAYER DCA - WAIVED): 7 % — ABNORMAL HIGH (ref 4.8–5.6)

## 2021-10-17 MED ORDER — OXYCODONE HCL 5 MG PO TABS: 5.0000 mg | ORAL_TABLET | Freq: Two times a day (BID) | ORAL | 0 refills | Status: DC | PRN

## 2021-10-17 NOTE — Assessment & Plan Note (Signed)
Doing well with A1c of 7.0. Continue current regimen. Continue to monitor. Call with any concerns. Refills given today. 

## 2021-10-17 NOTE — Progress Notes (Signed)
? ?BP 113/74   Pulse 80   Temp 98 ?F (36.7 ?C)   Wt 162 lb (73.5 kg)   SpO2 98%   BMI 25.37 kg/m?   ? ?Subjective:  ? ? Patient ID: Alexis [name], male    DOB: [birth year], 36 y.o.   MRN: [id number] ? ?HPI: ?Alexis [name] is a 36 y.o. male ? ?Chief Complaint  ?Patient presents with  ? Diabetes  ? ?DIABETES ?Hypoglycemic episodes:no ?Polydipsia/polyuria: no ?Visual disturbance: no ?Chest pain: no ?Paresthesias: no ?Glucose Monitoring: no ?Taking Insulin?: no ?Blood Pressure Monitoring: not checking ?Retinal Examination: Not up to Date ?Foot Exam: Up to Date ?Diabetic Education: Completed ?Pneumovax: Up to Date ?Influenza: Up to Date ?Aspirin: no ? ?CHRONIC PAIN  ?Present dose: 22-45 Morphine equivalents ?Pain control status: better ?Duration:  about 6 months ?Location: R leg ?Quality: aching and sore ?Current Pain Level: moderate ?Previous Pain Level: severe ?Breakthrough pain: no ?Benefit from narcotic medications: yes ?What Activities task can be accomplished with current medication? Able to sleep ?Interested in weaning off narcotics:yes   ?Stool softners/OTC fiber: no  ?Previous pain specialty evaluation: no ?Non-narcotic analgesic meds: yes ?Narcotic contract: yes ? ?Would like to go back to work. Has been doing things at home. Feels ready. Has been cleared by ortho. ? ?Relevant past medical, surgical, family and social history reviewed and updated as indicated. Interim medical history since our last visit reviewed. ?Allergies and medications reviewed and updated. ? ?Review of Systems  ?Constitutional: Negative.   ?Respiratory: Negative.    ?Cardiovascular: Negative.   ?Gastrointestinal: Negative.   ?Musculoskeletal:  Positive for gait problem and myalgias. Negative for arthralgias, back pain, joint swelling, neck pain and neck stiffness.  ?Skin: Negative.   ?Psychiatric/Behavioral: Negative.    ? ?Per HPI unless specifically indicated above ? ?   ?Objective:  ?  ?BP 113/74   Pulse 80   Temp  98 ?F (36.7 ?C)   Wt 162 lb (73.5 kg)   SpO2 98%   BMI 25.37 kg/m?   ?Wt Readings from Last 3 Encounters:  ?10/17/21 162 lb (73.5 kg)  ?08/21/21 154 lb (69.9 kg)  ?07/24/21 153 lb 3.2 oz (69.5 kg)  ?  ?Physical Exam ?Vitals and nursing note reviewed.  ?Constitutional:   ?   General: He is not in acute distress. ?   Appearance: Normal appearance. He is not ill-appearing, toxic-appearing or diaphoretic.  ?HENT:  ?   Head: Normocephalic and atraumatic.  ?   Right Ear: External ear normal.  ?   Left Ear: External ear normal.  ?   Nose: Nose normal.  ?   Mouth/Throat:  ?   Mouth: Mucous membranes are moist.  ?   Pharynx: Oropharynx is clear.  ?Eyes:  ?   General: No scleral icterus.    ?   Right eye: No discharge.     ?   Left eye: No discharge.  ?   Extraocular Movements: Extraocular movements intact.  ?   Conjunctiva/sclera: Conjunctivae normal.  ?   Pupils: Pupils are equal, round, and reactive to light.  ?Cardiovascular:  ?   Rate and Rhythm: Normal rate and regular rhythm.  ?   Pulses: Normal pulses.  ?   Heart sounds: Normal heart sounds. No murmur heard. ?  No friction rub. No gallop.  ?Pulmonary:  ?   Effort: Pulmonary effort is normal. No respiratory distress.  ?   Breath sounds: Normal breath sounds. No stridor. No wheezing, rhonchi or  rales.  ?Chest:  ?   Chest wall: No tenderness.  ?Musculoskeletal:     ?   General: Normal range of motion.  ?   Cervical back: Normal range of motion and neck supple.  ?Skin: ?   General: Skin is warm and dry.  ?   Capillary Refill: Capillary refill takes less than 2 seconds.  ?   Coloration: Skin is not jaundiced or pale.  ?   Findings: No bruising, erythema, lesion or rash.  ?Neurological:  ?   General: No focal deficit present.  ?   Mental Status: He is alert and oriented to person, place, and time. Mental status is at baseline.  ?Psychiatric:     ?   Mood and Affect: Mood normal.     ?   Behavior: Behavior normal.     ?   Thought Content: Thought content normal.     ?    Judgment: Judgment normal.  ? ? ?Results for orders placed or performed in visit on 10/17/21  ?Bayer DCA Hb A1c Waived  ?Result Value Ref Range  ? HB A1C (BAYER DCA - WAIVED) 7.0 (H) 4.8 - 5.6 %  ? ?   ?Assessment & Plan:  ? ?Problem List Items Addressed This Visit   ? ?  ? Endocrine  ? Uncontrolled type 1 diabetes mellitus with hyperglycemia, with long-term current use of insulin (HCC) - [place]  ?  Doing well with A1c of 7.0. Continue current regimen. Continue to monitor. Call with any concerns. Refills given today. ?  ?  ? Relevant Orders  ? Bayer DCA Hb A1c Waived (Completed)  ?  ? Other  ? Chronic traumatic pain  ?  Has not established with pain management. Encouraged him to call them. Will start weaning his medication down with goal of decreasing 1 pill a month. 5-10mg  BID PRN today. Recheck 1 month. OK to return to work 2 days a week. Unable to lift more than 20lbs. Reestablish in 1 month- goal of increasing 1 day every 2-3 weeks. Call with any concerns.  ?  ?  ? Relevant Medications  ? oxyCODONE (OXY IR/ROXICODONE) 5 MG immediate release tablet  ?  ? ?Follow up plan: ?Return in about 4 weeks (around 11/14/2021). ? ? ? ? ? ?

## 2021-10-17 NOTE — Assessment & Plan Note (Signed)
Has not established with pain management. Encouraged him to call them. Will start weaning his medication down with goal of decreasing 1 pill a month. 5-10mg  BID PRN today. Recheck 1 month. OK to return to work 2 days a week. Unable to lift more than 20lbs. Reestablish in 1 month- goal of increasing 1 day every 2-3 weeks. Call with any concerns.  ?

## 2021-10-23 ENCOUNTER — Encounter: Payer: Self-pay | Admitting: Family Medicine

## 2021-11-14 ENCOUNTER — Ambulatory Visit: Payer: Self-pay | Admitting: Family Medicine

## 2021-11-20 ENCOUNTER — Encounter: Payer: Self-pay | Admitting: Family Medicine

## 2021-11-20 ENCOUNTER — Telehealth (INDEPENDENT_AMBULATORY_CARE_PROVIDER_SITE_OTHER): Payer: Self-pay | Admitting: Family Medicine

## 2021-11-20 DIAGNOSIS — G8921 Chronic pain due to trauma: Secondary | ICD-10-CM

## 2021-11-20 MED ORDER — OXYCODONE HCL 5 MG PO TABS: 5.0000 mg | ORAL_TABLET | Freq: Two times a day (BID) | ORAL | 0 refills | Status: DC | PRN

## 2021-11-20 NOTE — Assessment & Plan Note (Signed)
Has not been able to establish with pain management. We will continue to decrease 1 pill a day. PMP reviewed and apporpriate. 90pills given today. Follow up 1 month. OK to return to work for 3 days a week.  ?

## 2021-11-20 NOTE — Progress Notes (Signed)
Called and lvm asking pt to call back to schedule an appointment. Also sent Mychart message ?

## 2021-11-20 NOTE — Progress Notes (Signed)
? ?There were no vitals taken for this visit.  ? ?Subjective:  ? ? Patient ID: Alexis [name], male    DOB: [birth year], 36 y.o.   MRN: [id number] ? ?HPI: ?Alexis [name] is a 36 y.o. male ? ?Chief Complaint  ?Patient presents with  ? Pain  ? ?CHRONIC PAIN  ?Present dose: 30 Morphine equivalents ?Pain control status: stable ?Duration: about 7 months ?Location: R leg ?Quality: aching and sore ?Current Pain Level: moderate ?Previous Pain Level: severe ?Breakthrough pain: yes ?Benefit from narcotic medications: yes ?What Activities task can be accomplished with current medication? Able to sleep ?Interested in weaning off narcotics:unsure   ?Stool softners/OTC fiber: no  ?Previous pain specialty evaluation: no ?Non-narcotic analgesic meds: yes ?Narcotic contract: yes ? ?Relevant past medical, surgical, family and social history reviewed and updated as indicated. Interim medical history since our last visit reviewed. ?Allergies and medications reviewed and updated. ? ?Review of Systems  ?Constitutional: Negative.   ?Respiratory: Negative.    ?Cardiovascular: Negative.   ?Gastrointestinal: Negative.   ?Musculoskeletal:  Positive for myalgias. Negative for arthralgias, back pain, gait problem, joint swelling, neck pain and neck stiffness.  ?Skin: Negative.   ?Psychiatric/Behavioral: Negative.    ? ?Per HPI unless specifically indicated above ? ?   ?Objective:  ?  ?There were no vitals taken for this visit.  ?Wt Readings from Last 3 Encounters:  ?10/17/21 162 lb (73.5 kg)  ?08/21/21 154 lb (69.9 kg)  ?07/24/21 153 lb 3.2 oz (69.5 kg)  ?  ?Physical Exam ?Vitals and nursing note reviewed.  ?Pulmonary:  ?   Effort: Pulmonary effort is normal. No respiratory distress.  ?   Comments: Speaking in full sentences ?Neurological:  ?   Mental Status: He is alert.  ?Psychiatric:     ?   Mood and Affect: Mood normal.     ?   Behavior: Behavior normal.     ?   Thought Content: Thought content normal.     ?   Judgment:  Judgment normal.  ? ? ?Results for orders placed or performed in visit on 10/17/21  ?Bayer DCA Hb A1c Waived  ?Result Value Ref Range  ? HB A1C (BAYER DCA - WAIVED) 7.0 (H) 4.8 - 5.6 %  ? ?   ?Assessment & Plan:  ? ?Problem List Items Addressed This Visit   ? ?  ? Other  ? Chronic traumatic pain  ?  Has not been able to establish with pain management. We will continue to decrease 1 pill a day. PMP reviewed and apporpriate. 90pills given today. Follow up 1 month. OK to return to work for 3 days a week.  ?  ?  ? Relevant Medications  ? oxyCODONE (OXY IR/ROXICODONE) 5 MG immediate release tablet  ?  ? ?Follow up plan: ?Return in about 4 weeks (around 12/18/2021). ? ? ? ?This visit was completed via telephone due to the restrictions of the COVID-19 pandemic. All issues as above were discussed and addressed but no physical exam was performed. If it was felt that the patient should be evaluated in the office, they were directed there. The patient verbally consented to this visit. Patient was unable to complete an audio/visual visit due to Lack of equipment. Due to the catastrophic nature of the COVID-19 pandemic, this visit was done through audio contact only. ?Location of the patient: work ?Location of the provider: work ?Those involved with this call:  ?Provider: Olevia Perches, DO ?CMA: Rolley Sims, CMA ?Front  Desk/Registration: [employer]  ?Time spent on call:  15 minutes on the phone discussing health concerns. 23 minutes total spent in review of patient's record and preparation of their chart. ? ? ? ?

## 2021-11-21 ENCOUNTER — Other Ambulatory Visit: Payer: Self-pay

## 2021-11-21 MED ORDER — LANTUS SOLOSTAR 100 UNIT/ML ~~LOC~~ SOPN: PEN_INJECTOR | SUBCUTANEOUS | 1 refills | Status: DC

## 2021-11-21 MED ORDER — METFORMIN HCL 500 MG PO TABS: 1000.0000 mg | ORAL_TABLET | Freq: Two times a day (BID) | ORAL | 1 refills | Status: DC

## 2021-11-21 MED ORDER — GABAPENTIN 400 MG PO CAPS: 800.0000 mg | ORAL_CAPSULE | Freq: Three times a day (TID) | ORAL | 3 refills | Status: DC

## 2021-11-21 MED ORDER — PANTOPRAZOLE SODIUM 40 MG PO TBEC: 40.0000 mg | DELAYED_RELEASE_TABLET | Freq: Every day | ORAL | 1 refills | Status: DC

## 2021-11-22 NOTE — Progress Notes (Signed)
2nd attempt to reach patient by phone 

## 2021-11-23 NOTE — Progress Notes (Signed)
3rd attempt to reach patient by phone 

## 2021-12-26 ENCOUNTER — Encounter: Payer: Self-pay | Admitting: Family Medicine

## 2021-12-26 ENCOUNTER — Telehealth (INDEPENDENT_AMBULATORY_CARE_PROVIDER_SITE_OTHER): Payer: Self-pay | Admitting: Family Medicine

## 2021-12-26 DIAGNOSIS — G8921 Chronic pain due to trauma: Secondary | ICD-10-CM

## 2021-12-26 MED ORDER — ATORVASTATIN CALCIUM 40 MG PO TABS: 40.0000 mg | ORAL_TABLET | Freq: Every day | ORAL | 1 refills | Status: DC

## 2021-12-26 MED ORDER — OXYCODONE HCL 5 MG PO TABS: 5.0000 mg | ORAL_TABLET | Freq: Two times a day (BID) | ORAL | 0 refills | Status: DC | PRN

## 2021-12-26 MED ORDER — METFORMIN HCL 500 MG PO TABS
1000.0000 mg | ORAL_TABLET | Freq: Two times a day (BID) | ORAL | 1 refills | Status: DC
Start: 2021-12-26 — End: 2023-02-18

## 2021-12-26 MED ORDER — GABAPENTIN 400 MG PO CAPS: 800.0000 mg | ORAL_CAPSULE | Freq: Three times a day (TID) | ORAL | 3 refills | Status: DC

## 2021-12-26 NOTE — Assessment & Plan Note (Signed)
Has not been able to establish with pain management. We will continue to decrease 1 pill a day. PMP reviewed and appropriate. 60pills given today. Follow up 1 month. OK to return to work full time.  ?

## 2021-12-26 NOTE — Progress Notes (Signed)
? ?There were no vitals taken for this visit.  ? ?Subjective:  ? ? Patient ID: Alexis [name], male    DOB: August 30, 1985, 36 y.o.   MRN: [id number] ? ?HPI: ?Alexis [name] is a 36 y.o. male ? ?Chief Complaint  ?Patient presents with  ? Chronic traumatic pain  ? ?CHRONIC PAIN- he has been back to full time work. He is feeling better and feels like it's doing OK  ?Present dose: 30.68 Morphine equivalents ?Pain control status: stable ?Duration: months ?Location: R leg ?Quality: aching and sore ?Current Pain Level: moderate ?Previous Pain Level: moderate ?Breakthrough pain: yes ?Benefit from narcotic medications: yes ?What Activities task can be accomplished with current medication? Able to work ?Interested in weaning off narcotics:yes   ?Stool softners/OTC fiber: no  ?Previous pain specialty evaluation: no ?Non-narcotic analgesic meds: yes ?Narcotic contract: yes ? ?Relevant past medical, surgical, family and social history reviewed and updated as indicated. Interim medical history since our last visit reviewed. ?Allergies and medications reviewed and updated. ? ?Review of Systems  ?Respiratory: Negative.    ?Cardiovascular: Negative.   ?Musculoskeletal:  Positive for myalgias. Negative for arthralgias, back pain, gait problem, joint swelling, neck pain and neck stiffness.  ?Skin: Negative.   ?Neurological: Negative.   ?Psychiatric/Behavioral: Negative.    ? ?Per HPI unless specifically indicated above ? ?   ?Objective:  ?  ?There were no vitals taken for this visit.  ?Wt Readings from Last 3 Encounters:  ?10/17/21 162 lb (73.5 kg)  ?08/21/21 154 lb (69.9 kg)  ?07/24/21 153 lb 3.2 oz (69.5 kg)  ?  ?Physical Exam ?Vitals and nursing note reviewed.  ?Constitutional:   ?   General: He is not in acute distress. ?   Appearance: Normal appearance. He is not ill-appearing, toxic-appearing or diaphoretic.  ?HENT:  ?   Head: Normocephalic and atraumatic.  ?   Right Ear: External ear normal.  ?   Left Ear: External  ear normal.  ?   Nose: Nose normal.  ?   Mouth/Throat:  ?   Mouth: Mucous membranes are moist.  ?   Pharynx: Oropharynx is clear.  ?Eyes:  ?   General: No scleral icterus.    ?   Right eye: No discharge.     ?   Left eye: No discharge.  ?   Conjunctiva/sclera: Conjunctivae normal.  ?   Pupils: Pupils are equal, round, and reactive to light.  ?Pulmonary:  ?   Effort: Pulmonary effort is normal. No respiratory distress.  ?   Comments: Speaking in full sentences ?Musculoskeletal:     ?   General: Normal range of motion.  ?   Cervical back: Normal range of motion.  ?Skin: ?   Coloration: Skin is not jaundiced or pale.  ?   Findings: No bruising, erythema, lesion or rash.  ?Neurological:  ?   Mental Status: He is alert and oriented to person, place, and time. Mental status is at baseline.  ?Psychiatric:     ?   Mood and Affect: Mood normal.     ?   Behavior: Behavior normal.     ?   Thought Content: Thought content normal.     ?   Judgment: Judgment normal.  ? ? ?Results for orders placed or performed in visit on 10/17/21  ?Bayer DCA Hb A1c Waived  ?Result Value Ref Range  ? HB A1C (BAYER DCA - WAIVED) 7.0 (H) 4.8 - 5.6 %  ? ?   ?  Assessment & Plan:  ? ?Problem List Items Addressed This Visit   ? ?  ? Other  ? Chronic traumatic pain  ?  Has not been able to establish with pain management. We will continue to decrease 1 pill a day. PMP reviewed and appropriate. 60pills given today. Follow up 1 month. OK to return to work full time.  ? ?  ?  ? Relevant Medications  ? oxyCODONE (OXY IR/ROXICODONE) 5 MG immediate release tablet  ? gabapentin (NEURONTIN) 400 MG capsule  ?  ? ?Follow up plan: ?Return in about 4 weeks (around 01/23/2022). ? ? ? ?This visit was completed via video visit through MyChart due to the restrictions of the COVID-19 pandemic. All issues as above were discussed and addressed. Physical exam was done as above through visual confirmation on video through MyChart. If it was felt that the patient should be  evaluated in the office, they were directed there. The patient verbally consented to this visit. ?Location of the patient: home ?Location of the provider: work ?Those involved with this call:  ?Provider: Olevia Perches, DO ?CMA: Rolley Sims, CMA ?Front Desk/Registration: [employer]  ?Time spent on call:  15 minutes with patient face to face via video conference. More than 50% of this time was spent in counseling and coordination of care. 23 minutes total spent in review of patient's record and preparation of their chart. ? ? ? ?

## 2021-12-27 NOTE — Progress Notes (Signed)
Patient scheduled.

## 2022-01-25 ENCOUNTER — Ambulatory Visit (INDEPENDENT_AMBULATORY_CARE_PROVIDER_SITE_OTHER): Payer: 59 | Admitting: Family Medicine

## 2022-01-25 ENCOUNTER — Other Ambulatory Visit: Payer: Self-pay

## 2022-01-25 ENCOUNTER — Encounter: Payer: Self-pay | Admitting: Family Medicine

## 2022-01-25 VITALS — BP 126/89 | HR 88 | Temp 98.3°F | Ht 67.01 in | Wt 171.4 lb

## 2022-01-25 DIAGNOSIS — N5089 Other specified disorders of the male genital organs: Secondary | ICD-10-CM | POA: Diagnosis not present

## 2022-01-25 DIAGNOSIS — G8921 Chronic pain due to trauma: Secondary | ICD-10-CM

## 2022-01-25 DIAGNOSIS — E1065 Type 1 diabetes mellitus with hyperglycemia: Secondary | ICD-10-CM

## 2022-01-25 LAB — URINALYSIS, ROUTINE W REFLEX MICROSCOPIC
Bilirubin, UA: NEGATIVE
Ketones, UA: NEGATIVE
Leukocytes,UA: NEGATIVE
Nitrite, UA: NEGATIVE
RBC, UA: NEGATIVE
Specific Gravity, UA: >1.03 — ABNORMAL HIGH (ref 1.005–1.030)
Urobilinogen, Ur: 0.2 mg/dL (ref 0.2–1.0)
pH, UA: 5.5 (ref 5.0–7.5)

## 2022-01-25 LAB — BAYER DCA HB A1C WAIVED: HB A1C (BAYER DCA - WAIVED): 8.1 % — ABNORMAL HIGH (ref 4.8–5.6)

## 2022-01-25 LAB — MICROALBUMIN, URINE WAIVED
Creatinine, Urine Waived: 200 mg/dL (ref 10–300)
Microalb, Ur Waived: 30 mg/L — ABNORMAL HIGH (ref 0–19)
Microalb/Creat Ratio: <30 mg/g (ref <30)

## 2022-01-25 MED ORDER — OXYCODONE-ACETAMINOPHEN 5-325 MG PO TABS: 1.0000 | ORAL_TABLET | Freq: Two times a day (BID) | ORAL | 0 refills | Status: DC | PRN

## 2022-01-25 MED ORDER — OXYCODONE HCL 5 MG PO TABS: 5.0000 mg | ORAL_TABLET | Freq: Two times a day (BID) | ORAL | 0 refills | Status: DC | PRN

## 2022-01-25 NOTE — Progress Notes (Unsigned)
   BP 126/89   Pulse 88   Temp 98.3 F (36.8 C) (Oral)   Ht 5' 7.01" (1.702 m)   Wt 171 lb 6.4 oz (77.7 kg)   SpO2 98%   BMI 26.84 kg/m    Subjective:    Patient ID: Alexis [name], male    DOB: 03-05-1986, 36 y.o.   MRN: [id number]  HPI: Alexis [name] is a 36 y.o. male  Chief Complaint  Patient presents with   Diabetes   Pain   DIABETES Hypoglycemic episodes:{Blank single:19197::"yes","no"} Polydipsia/polyuria: {Blank single:19197::"yes","no"} Visual disturbance: {Blank single:19197::"yes","no"} Chest pain: {Blank single:19197::"yes","no"} Paresthesias: {Blank single:19197::"yes","no"} Glucose Monitoring: {Blank single:19197::"yes","no"}  Accucheck frequency: {Blank single:19197::"Not Checking","Daily","BID","TID"}  Fasting glucose:  Post prandial:  Evening:  Before meals: Taking Insulin?: {Blank single:19197::"yes","no"}  Long acting insulin:  Short acting insulin: Blood Pressure Monitoring: {Blank single:19197::"not checking","rarely","daily","weekly","monthly","a few times a day","a few times a week","a few times a month"} Retinal Examination: {Blank single:19197::"Up to Date","Not up to Date"} Foot Exam: {Blank single:19197::"Up to Date","Not up to Date"} Diabetic Education: {Blank single:19197::"Completed","Not Completed"} Pneumovax: {Blank single:19197::"Up to Date","Not up to Date","unknown"} Influenza: {Blank single:19197::"Up to Date","Not up to Date","unknown"} Aspirin: {Blank single:19197::"yes","no"}  CHRONIC PAIN  Present dose: 15-30 Morphine equivalents Pain control status: {Blank single:19197::"controlled","uncontrolled","better","worse","exacerbated","stable"} Duration: {Blank single:19197::"chronic","months","years"} Location:  Quality: {Blank multiple:19196::"sharp","dull","aching","burning","cramping","ill-defined","itchy","pressure-like","pulling","shooting","sore","stabbing","tender","tearing","throbbing"} Current Pain Level:  {Blank single:19197::"mild","moderate","severe","1/10","2/10","3/10","4/10","5/10","6/10","7/10","8/10","9/10","10/10"} Previous Pain Level: {Blank single:19197::"mild","moderate","severe","1/10","2/10","3/10","4/10","5/10","6/10","7/10","8/10","9/10","10/10"} Breakthrough pain: {Blank single:19197::"yes","no"} Benefit from narcotic medications: {Blank single:19197::"yes","no"} What Activities task can be accomplished with current medication? Interested in weaning off narcotics:{Blank single:19197::"yes","no"}   Stool softners/OTC fiber: {Blank single:19197::"yes","no"}  Previous pain specialty evaluation: {Blank single:19197::"yes","no"} Non-narcotic analgesic meds: {Blank single:19197::"yes","no"} Narcotic contract: {Blank single:19197::"yes","no"}  Relevant past medical, surgical, family and social history reviewed and updated as indicated. Interim medical history since our last visit reviewed. Allergies and medications reviewed and updated.  Review of Systems  Per HPI unless specifically indicated above     Objective:    BP 126/89   Pulse 88   Temp 98.3 F (36.8 C) (Oral)   Ht 5' 7.01" (1.702 m)   Wt 171 lb 6.4 oz (77.7 kg)   SpO2 98%   BMI 26.84 kg/m   Wt Readings from Last 3 Encounters:  01/25/22 171 lb 6.4 oz (77.7 kg)  10/17/21 162 lb (73.5 kg)  08/21/21 154 lb (69.9 kg)    Physical Exam  Results for orders placed or performed in visit on 10/17/21  Bayer DCA Hb A1c Waived  Result Value Ref Range   HB A1C (BAYER DCA - WAIVED) 7.0 (H) 4.8 - 5.6 %      Assessment & Plan:   Problem List Items Addressed This Visit       Endocrine   Uncontrolled type 1 diabetes mellitus with hyperglycemia, with long-term current use of insulin (HCC) - Primary   Relevant Orders   Bayer DCA Hb A1c Waived   CBC with Differential/Platelet   Comprehensive metabolic panel   Lipid Panel w/o Chol/HDL Ratio   TSH   Urinalysis, Routine w reflex microscopic   Microalbumin, Urine Waived      Follow up plan: No follow-ups on file.

## 2022-01-26 LAB — CBC WITH DIFFERENTIAL/PLATELET
Basophils Absolute: 0 10*3/uL (ref 0.0–0.2)
Basos: 0 %
EOS (ABSOLUTE): 0.1 10*3/uL (ref 0.0–0.4)
Eos: 1 %
Hematocrit: 40 % (ref 37.5–51.0)
Hemoglobin: 13.9 g/dL (ref 13.0–17.7)
Immature Grans (Abs): 0 10*3/uL (ref 0.0–0.1)
Immature Granulocytes: 0 %
Lymphocytes Absolute: 2.2 10*3/uL (ref 0.7–3.1)
Lymphs: 32 %
MCH: 29 pg (ref 26.6–33.0)
MCHC: 34.8 g/dL (ref 31.5–35.7)
MCV: 83 fL (ref 79–97)
Monocytes Absolute: 0.6 10*3/uL (ref 0.1–0.9)
Monocytes: 8 %
Neutrophils Absolute: 4 10*3/uL (ref 1.4–7.0)
Neutrophils: 59 %
Platelets: 311 10*3/uL (ref 150–450)
RBC: 4.8 x10E6/uL (ref 4.14–5.80)
RDW: 13.1 % (ref 11.6–15.4)
WBC: 6.8 10*3/uL (ref 3.4–10.8)

## 2022-01-26 LAB — COMPREHENSIVE METABOLIC PANEL
ALT: <5 IU/L (ref 0–44)
AST: 19 IU/L (ref 0–40)
Albumin/Globulin Ratio: 1.9 (ref 1.2–2.2)
Albumin: 4.3 g/dL (ref 4.0–5.0)
Alkaline Phosphatase: 111 IU/L (ref 44–121)
BUN/Creatinine Ratio: 24 — ABNORMAL HIGH (ref 9–20)
BUN: 18 mg/dL (ref 6–20)
Bilirubin Total: 0.2 mg/dL (ref 0.0–1.2)
CO2: 21 mmol/L (ref 20–29)
Calcium: 9.2 mg/dL (ref 8.7–10.2)
Chloride: 101 mmol/L (ref 96–106)
Creatinine, Ser: 0.74 mg/dL — ABNORMAL LOW (ref 0.76–1.27)
Globulin, Total: 2.3 g/dL (ref 1.5–4.5)
Glucose: 269 mg/dL — ABNORMAL HIGH (ref 70–99)
Potassium: 4.3 mmol/L (ref 3.5–5.2)
Sodium: 135 mmol/L (ref 134–144)
Total Protein: 6.6 g/dL (ref 6.0–8.5)
eGFR: 119 mL/min/{1.73_m2} (ref >59)

## 2022-01-26 LAB — LIPID PANEL W/O CHOL/HDL RATIO
Cholesterol, Total: 121 mg/dL (ref 100–199)
HDL: 44 mg/dL (ref >39)
LDL Chol Calc (NIH): 52 mg/dL (ref 0–99)
Triglycerides: 142 mg/dL (ref 0–149)
VLDL Cholesterol Cal: 25 mg/dL (ref 5–40)

## 2022-01-26 LAB — TSH: TSH: 1.22 u[IU]/mL (ref 0.450–4.500)

## 2022-01-29 ENCOUNTER — Encounter: Payer: Self-pay | Admitting: Family Medicine

## 2022-01-29 NOTE — Assessment & Plan Note (Signed)
Has not been able to establish with pain management. Will continue to decrease. Will cut down from 60 to 45 pills this month. PMP reviewed and appropriate. Follow up 1 month with goal of continuing to decrease off within the next 3 months.

## 2022-01-29 NOTE — Assessment & Plan Note (Signed)
Doing well with A1c of 7.0. Continue current regimen. Continue to monitor. Call with any concerns. Refills given today.

## 2022-02-01 ENCOUNTER — Ambulatory Visit: Payer: 59

## 2022-02-06 ENCOUNTER — Ambulatory Visit
Admission: RE | Admit: 2022-02-06 | Discharge: 2022-02-06 | Disposition: A | Discharge status: Home / Self Care | Payer: 59 | Source: Ambulatory Visit | Attending: Family Medicine | Admitting: Family Medicine

## 2022-02-06 DIAGNOSIS — N5089 Other specified disorders of the male genital organs: Secondary | ICD-10-CM | POA: Insufficient documentation

## 2022-02-08 ENCOUNTER — Other Ambulatory Visit: Payer: Self-pay | Admitting: Family Medicine

## 2022-02-08 DIAGNOSIS — N5089 Other specified disorders of the male genital organs: Secondary | ICD-10-CM

## 2022-02-20 ENCOUNTER — Other Ambulatory Visit
Admission: RE | Admit: 2022-02-20 | Discharge: 2022-02-20 | Disposition: A | Discharge status: Home / Self Care | Payer: 59 | Attending: Urology | Admitting: Urology

## 2022-02-20 ENCOUNTER — Other Ambulatory Visit: Payer: Self-pay | Admitting: *Deleted

## 2022-02-20 ENCOUNTER — Encounter: Payer: Self-pay | Admitting: Urology

## 2022-02-20 ENCOUNTER — Ambulatory Visit (INDEPENDENT_AMBULATORY_CARE_PROVIDER_SITE_OTHER): Payer: 59 | Admitting: Urology

## 2022-02-20 VITALS — BP 113/76 | HR 80 | Ht 67.0 in | Wt 171.0 lb

## 2022-02-20 DIAGNOSIS — N500 Atrophy of testis: Secondary | ICD-10-CM | POA: Diagnosis not present

## 2022-02-20 DIAGNOSIS — N529 Male erectile dysfunction, unspecified: Secondary | ICD-10-CM

## 2022-02-20 DIAGNOSIS — N5089 Other specified disorders of the male genital organs: Secondary | ICD-10-CM

## 2022-02-20 DIAGNOSIS — N5082 Scrotal pain: Secondary | ICD-10-CM

## 2022-02-20 LAB — URINALYSIS, COMPLETE (UACMP) WITH MICROSCOPIC
Bacteria, UA: NONE SEEN
Bilirubin Urine: NEGATIVE
Glucose, UA: 100 mg/dL — AB
Hgb urine dipstick: NEGATIVE
Ketones, ur: NEGATIVE mg/dL
Leukocytes,Ua: NEGATIVE
Nitrite: NEGATIVE
Protein, ur: NEGATIVE mg/dL
Specific Gravity, Urine: 1.015 (ref 1.005–1.030)
Squamous Epithelial / HPF: NONE SEEN (ref 0–5)
WBC, UA: NONE SEEN WBC/hpf (ref 0–5)
pH: 6 (ref 5.0–8.0)

## 2022-02-20 MED ORDER — TADALAFIL 5 MG PO TABS: 5.0000 mg | ORAL_TABLET | Freq: Every day | ORAL | 11 refills | Status: DC | PRN

## 2022-02-20 MED ORDER — CELECOXIB 200 MG PO CAPS
200.0000 mg | ORAL_CAPSULE | Freq: Two times a day (BID) | ORAL | 0 refills | Status: AC
Start: 2022-02-20 — End: 2022-03-06
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
## 2022-02-26 ENCOUNTER — Telehealth (INDEPENDENT_AMBULATORY_CARE_PROVIDER_SITE_OTHER): Payer: 59 | Admitting: Family Medicine

## 2022-02-26 ENCOUNTER — Encounter: Payer: Self-pay | Admitting: Family Medicine

## 2022-02-26 DIAGNOSIS — H9192 Unspecified hearing loss, left ear: Secondary | ICD-10-CM | POA: Diagnosis not present

## 2022-02-26 DIAGNOSIS — G8921 Chronic pain due to trauma: Secondary | ICD-10-CM | POA: Diagnosis not present

## 2022-02-26 MED ORDER — OXYCODONE HCL 5 MG PO TABS: 5.0000 mg | ORAL_TABLET | Freq: Every day | ORAL | 0 refills | Status: DC | PRN

## 2022-02-26 MED ORDER — PANTOPRAZOLE SODIUM 40 MG PO TBEC: 40.0000 mg | DELAYED_RELEASE_TABLET | Freq: Every day | ORAL | 1 refills | Status: DC
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
## 2022-02-26 NOTE — Assessment & Plan Note (Signed)
Would like to see ENT. Referral replaced. Encouraged him to call to schedule.

## 2022-02-26 NOTE — Assessment & Plan Note (Signed)
Continuing taper on his pain medicine. Dropped to 30 pills this month. Goal of 15 pills next month and then stopping. Continue to monitor.

## 2022-03-01 NOTE — Progress Notes (Signed)
Lvm asking patient to call back to schedule his follow up appointment 

## 2022-03-02 NOTE — Progress Notes (Signed)
2nd attempt to reach patient.

## 2022-03-05 NOTE — Progress Notes (Signed)
3rd attempt to reach patient.

## 2022-03-27 ENCOUNTER — Ambulatory Visit: Payer: 59 | Admitting: Urology

## 2022-03-30 ENCOUNTER — Encounter: Payer: Self-pay | Admitting: Family Medicine

## 2022-03-30 ENCOUNTER — Telehealth (INDEPENDENT_AMBULATORY_CARE_PROVIDER_SITE_OTHER): Payer: 59 | Admitting: Family Medicine

## 2022-03-30 DIAGNOSIS — G8921 Chronic pain due to trauma: Secondary | ICD-10-CM

## 2022-03-30 MED ORDER — OXYCODONE HCL 5 MG PO TABS: 5.0000 mg | ORAL_TABLET | Freq: Every day | ORAL | 0 refills | Status: DC | PRN

## 2022-03-30 NOTE — Assessment & Plan Note (Signed)
Final taper on his pain medicine today- down to 15 pills for this month. Will not get refill. Doing much better. Feeling more like himself. No other concerns or complaints at this time.

## 2022-03-30 NOTE — Progress Notes (Signed)
Pt scheduled  

## 2022-03-30 NOTE — Progress Notes (Signed)
There were no vitals taken for this visit.   Subjective:    Patient ID: Alexis [name], male    DOB: Nov 28, 1985, 36 y.o.   MRN: [id number]  HPI: Alexis [name] is a 36 y.o. male  Chief Complaint  Patient presents with   Pain   CHRONIC PAIN  Present dose: 7.5 Morphine equivalents Pain control status: better Duration: chronic Location: R leg Quality: aching and sore Current Pain Level: mild Previous Pain Level: moderate Breakthrough pain: yes Benefit from narcotic medications: yes What Activities task can be accomplished with current medication? Interested in weaning off narcotics:yes   Stool softners/OTC fiber: no  Previous pain specialty evaluation: no Non-narcotic analgesic meds: yes Narcotic contract: yes   Relevant past medical, surgical, family and social history reviewed and updated as indicated. Interim medical history since our last visit reviewed. Allergies and medications reviewed and updated.  Review of Systems  Constitutional: Negative.   Respiratory: Negative.    Cardiovascular: Negative.   Gastrointestinal: Negative.   Musculoskeletal: Negative.   Neurological: Negative.   Psychiatric/Behavioral: Negative.      Per HPI unless specifically indicated above     Objective:    There were no vitals taken for this visit.  Wt Readings from Last 3 Encounters:  02/20/22 171 lb (77.6 kg)  01/25/22 171 lb 6.4 oz (77.7 kg)  10/17/21 162 lb (73.5 kg)    Physical Exam Vitals and nursing note reviewed.  Constitutional:      General: He is not in acute distress.    Appearance: Normal appearance. He is normal weight. He is not ill-appearing, toxic-appearing or diaphoretic.  HENT:     Head: Normocephalic and atraumatic.     Right Ear: External ear normal.     Left Ear: External ear normal.     Nose: Nose normal.     Mouth/Throat:     Mouth: Mucous membranes are moist.     Pharynx: Oropharynx is clear.  Eyes:     General: No scleral  icterus.       Right eye: No discharge.        Left eye: No discharge.     Conjunctiva/sclera: Conjunctivae normal.     Pupils: Pupils are equal, round, and reactive to light.  Pulmonary:     Effort: Pulmonary effort is normal. No respiratory distress.     Comments: Speaking in full sentences Musculoskeletal:        General: Normal range of motion.     Cervical back: Normal range of motion.  Skin:    Coloration: Skin is not jaundiced or pale.     Findings: No bruising, erythema, lesion or rash.  Neurological:     Mental Status: He is alert and oriented to person, place, and time. Mental status is at baseline.  Psychiatric:        Mood and Affect: Mood normal.        Behavior: Behavior normal.        Thought Content: Thought content normal.        Judgment: Judgment normal.     Results for orders placed or performed during the hospital encounter of 02/20/22  Urinalysis, Complete w Microscopic  Result Value Ref Range   Color, Urine YELLOW YELLOW   APPearance CLEAR CLEAR   Specific Gravity, Urine 1.015 1.005 - 1.030   pH 6.0 5.0 - 8.0   Glucose, UA 100 (A) NEGATIVE mg/dL   Hgb urine dipstick NEGATIVE NEGATIVE   Bilirubin  Urine NEGATIVE NEGATIVE   Ketones, ur NEGATIVE NEGATIVE mg/dL   Protein, ur NEGATIVE NEGATIVE mg/dL   Nitrite NEGATIVE NEGATIVE   Leukocytes,Ua NEGATIVE NEGATIVE   Squamous Epithelial / LPF NONE SEEN 0 - 5   WBC, UA NONE SEEN 0 - 5 WBC/hpf   RBC / HPF 0-5 0 - 5 RBC/hpf   Bacteria, UA NONE SEEN NONE SEEN      Assessment & Plan:   Problem List Items Addressed This Visit       Other   Chronic traumatic pain    Final taper on his pain medicine today- down to 15 pills for this month. Will not get refill. Doing much better. Feeling more like himself. No other concerns or complaints at this time.       Relevant Medications   oxyCODONE (OXY IR/ROXICODONE) 5 MG immediate release tablet     Follow up plan: Return in about 4 weeks (around 04/27/2022) for  in person for DM.   This visit was completed via video visit through MyChart due to the restrictions of the COVID-19 pandemic. All issues as above were discussed and addressed. Physical exam was done as above through visual confirmation on video through MyChart. If it was felt that the patient should be evaluated in the office, they were directed there. The patient verbally consented to this visit. Location of the patient: home Location of the provider: work Those involved with this call:  Provider: Olevia Perches, DO CMA: Rolley Sims, CMA Front Desk/Registration: [employer]  Time spent on call:  15 minutes with patient face to face via video conference. More than 50% of this time was spent in counseling and coordination of care. 23 minutes total spent in review of patient's record and preparation of their chart.

## 2022-04-09 ENCOUNTER — Telehealth: Payer: 59 | Admitting: Nurse Practitioner

## 2022-04-09 DIAGNOSIS — K047 Periapical abscess without sinus: Secondary | ICD-10-CM | POA: Diagnosis not present

## 2022-04-09 MED ORDER — NAPROXEN 500 MG PO TABS: 500.0000 mg | ORAL_TABLET | Freq: Two times a day (BID) | ORAL | 0 refills | Status: DC

## 2022-04-09 MED ORDER — AMOXICILLIN 500 MG PO CAPS: 500.0000 mg | ORAL_CAPSULE | Freq: Three times a day (TID) | ORAL | 0 refills | Status: AC

## 2022-04-09 NOTE — Progress Notes (Signed)
E-Visit for Dental Pain  We are sorry that you are not feeling well.  Here is how we plan to help!  Based on what you have shared with me in the questionnaire, it sounds like you have an infection around your tooth. The antibiotics you had last time are very strong, it is not our preference to repeat those at this time. Repeated use of antibiotics can lead to C. Diff which it appears you have had in the past. We will also not be able to treat this a third time, please be sure to schedule an appointment with your dentist.  naprosyn 500mg  2 times per day for 7 days for discomfort and Amoxicillin 500mg  3 times per day for 10 days  It is imperative that you see a dentist within 10 days of this eVisit to determine the cause of the dental pain and be sure it is adequately treated  A toothache or tooth pain is caused when the nerve in the root of a tooth or surrounding a tooth is irritated. Dental (tooth) infection, decay, injury, or loss of a tooth are the most common causes of dental pain. Pain may also occur after an extraction (tooth is pulled out). Pain sometimes originates from other areas and radiates to the jaw, thus appearing to be tooth pain.Bacteria growing inside your mouth can contribute to gum disease and dental decay, both of which can cause pain. A toothache occurs from inflammation of the central portion of the tooth called pulp. The pulp contains nerve endings that are very sensitive to pain. Inflammation to the pulp or pulpitis may be caused by dental cavities, trauma, and infection.    HOME CARE:   For toothaches: Over-the-counter pain medications such as acetaminophen or ibuprofen may be used. Take these as directed on the package while you arrange for a dental appointment. Avoid very cold or hot foods, because they may make the pain worse. You may get relief from biting on a cotton ball soaked in oil of cloves. You can get oil of cloves at most drug stores.  For jaw pain:  Aspirin  may be helpful for problems in the joint of the jaw in adults. If pain happens every time you open your mouth widely, the temporomandibular joint (TMJ) may be the source of the pain. Yawning or taking a large bite of food may worsen the pain. An appointment with your doctor or dentist will help you find the cause.     GET HELP RIGHT AWAY IF:  You have a high fever or chills If you have had a recent head or face injury and develop headache, light headedness, nausea, vomiting, or other symptoms that concern you after an injury to your face or mouth, you could have a more serious injury in addition to your dental injury. A facial rash associated with a toothache: This condition may improve with medication. Contact your doctor for them to decide what is appropriate. Any jaw pain occurring with chest pain: Although jaw pain is most commonly caused by dental disease, it is sometimes referred pain from other areas. People with heart disease, especially people who have had stents placed, people with diabetes, or those who have had heart surgery may have jaw pain as a symptom of heart attack or angina. If your jaw or tooth pain is associated with lightheadedness, sweating, or shortness of breath, you should see a doctor as soon as possible. Trouble swallowing or excessive pain or bleeding from gums: If you have a  history of a weakened immune system, diabetes, or steroid use, you may be more susceptible to infections. Infections can often be more severe and extensive or caused by unusual organisms. Dental and gum infections in people with these conditions may require more aggressive treatment. An abscess may need draining or IV antibiotics, for example.  MAKE SURE YOU   Understand these instructions. Will watch your condition. Will get help right away if you are not doing well or get worse.  Thank you for choosing an e-visit.  Your e-visit answers were reviewed by a board certified advanced clinical  practitioner to complete your personal care plan. Depending upon the condition, your plan could have included both over the counter or prescription medications.  Please review your pharmacy choice. Make sure the pharmacy is open so you can pick up prescription now. If there is a problem, you may contact your provider through [employer] and have the prescription routed to another pharmacy.  Your safety is important to [place]. If you have drug allergies check your prescription carefully.   For the next 24 hours you can use MyChart to ask questions about today's visit, request a non-urgent call back, or ask for a work or school excuse. You will get an email in the next two days asking about your experience. I hope that your e-visit has been valuable and will speed your recovery.   Meds ordered this encounter  Medications   naproxen (NAPROSYN) 500 MG tablet    Sig: Take 1 tablet (500 mg total) by mouth 2 (two) times daily with a meal.    Dispense:  30 tablet    Refill:  0   amoxicillin (AMOXIL) 500 MG capsule    Sig: Take 1 capsule (500 mg total) by mouth 3 (three) times daily for 10 days.    Dispense:  30 capsule    Refill:  0     I spent approximately 5 minutes reviewing the patient's history, current symptoms and coordinating their plan of care today.

## 2022-05-03 ENCOUNTER — Encounter: Payer: Self-pay | Admitting: Family Medicine

## 2022-05-03 ENCOUNTER — Ambulatory Visit: Payer: 59 | Admitting: Family Medicine

## 2022-05-03 VITALS — BP 109/73 | HR 89 | Temp 97.8°F | Wt 173.4 lb

## 2022-05-03 DIAGNOSIS — E1065 Type 1 diabetes mellitus with hyperglycemia: Secondary | ICD-10-CM | POA: Diagnosis not present

## 2022-05-03 LAB — BAYER DCA HB A1C WAIVED: HB A1C (BAYER DCA - WAIVED): 7.9 % — ABNORMAL HIGH (ref 4.8–5.6)

## 2022-05-03 MED ORDER — LANTUS SOLOSTAR 100 UNIT/ML ~~LOC~~ SOPN: PEN_INJECTOR | SUBCUTANEOUS | 1 refills | Status: DC

## 2022-05-03 MED ORDER — ATORVASTATIN CALCIUM 40 MG PO TABS: 40.0000 mg | ORAL_TABLET | ORAL | 0 refills | Status: DC

## 2022-05-03 NOTE — Assessment & Plan Note (Signed)
Stable with A1c of 7.9. Continue current regimen. Continue to monitor. Refills given.

## 2022-05-03 NOTE — Progress Notes (Signed)
BP 109/73   Pulse 89   Temp 97.8 F (36.6 C)   Wt 173 lb 6.4 oz (78.7 kg)   SpO2 98%   BMI 27.16 kg/m    Subjective:    Patient ID: Alexis [name], male    DOB: 1986-01-21, 36 y.o.   MRN: [id number]  HPI: Alexis [name] is a 36 y.o. male  Chief Complaint  Patient presents with   Diabetes   DIABETES Hypoglycemic episodes:yes- rarely Polydipsia/polyuria: no Visual disturbance: no Chest pain: no Paresthesias: no Glucose Monitoring: no  Accucheck frequency: Not Checking Taking Insulin?: yes Blood Pressure Monitoring: not checking Retinal Examination: Not up to Date Foot Exam: Up to Date Diabetic Education: Completed Pneumovax: Up to Date Influenza: Not up to Date Aspirin: no  Relevant past medical, surgical, family and social history reviewed and updated as indicated. Interim medical history since our last visit reviewed. Allergies and medications reviewed and updated.  Review of Systems  Constitutional: Negative.   Respiratory: Negative.    Cardiovascular: Negative.   Gastrointestinal: Negative.   Musculoskeletal: Negative.   Neurological: Negative.   Psychiatric/Behavioral: Negative.      Per HPI unless specifically indicated above     Objective:    BP 109/73   Pulse 89   Temp 97.8 F (36.6 C)   Wt 173 lb 6.4 oz (78.7 kg)   SpO2 98%   BMI 27.16 kg/m   Wt Readings from Last 3 Encounters:  05/03/22 173 lb 6.4 oz (78.7 kg)  02/20/22 171 lb (77.6 kg)  01/25/22 171 lb 6.4 oz (77.7 kg)    Physical Exam Vitals and nursing note reviewed.  Constitutional:      General: He is not in acute distress.    Appearance: Normal appearance. He is normal weight. He is not ill-appearing, toxic-appearing or diaphoretic.  HENT:     Head: Normocephalic and atraumatic.     Right Ear: External ear normal.     Left Ear: External ear normal.     Nose: Nose normal.     Mouth/Throat:     Mouth: Mucous membranes are moist.     Pharynx: Oropharynx is  clear.  Eyes:     General: No scleral icterus.       Right eye: No discharge.        Left eye: No discharge.     Extraocular Movements: Extraocular movements intact.     Conjunctiva/sclera: Conjunctivae normal.     Pupils: Pupils are equal, round, and reactive to light.  Cardiovascular:     Rate and Rhythm: Normal rate and regular rhythm.     Pulses: Normal pulses.     Heart sounds: Normal heart sounds. No murmur heard.    No friction rub. No gallop.  Pulmonary:     Effort: Pulmonary effort is normal. No respiratory distress.     Breath sounds: Normal breath sounds. No stridor. No wheezing, rhonchi or rales.  Chest:     Chest wall: No tenderness.  Musculoskeletal:        General: Normal range of motion.     Cervical back: Normal range of motion and neck supple.  Skin:    General: Skin is warm and dry.     Capillary Refill: Capillary refill takes less than 2 seconds.     Coloration: Skin is not jaundiced or pale.     Findings: No bruising, erythema, lesion or rash.  Neurological:     General: No focal deficit present.  Mental Status: He is alert and oriented to person, place, and time. Mental status is at baseline.  Psychiatric:        Mood and Affect: Mood normal.        Behavior: Behavior normal.        Thought Content: Thought content normal.        Judgment: Judgment normal.     Results for orders placed or performed during the hospital encounter of 02/20/22  Urinalysis, Complete w Microscopic  Result Value Ref Range   Color, Urine YELLOW YELLOW   APPearance CLEAR CLEAR   Specific Gravity, Urine 1.015 1.005 - 1.030   pH 6.0 5.0 - 8.0   Glucose, UA 100 (A) NEGATIVE mg/dL   Hgb urine dipstick NEGATIVE NEGATIVE   Bilirubin Urine NEGATIVE NEGATIVE   Ketones, ur NEGATIVE NEGATIVE mg/dL   Protein, ur NEGATIVE NEGATIVE mg/dL   Nitrite NEGATIVE NEGATIVE   Leukocytes,Ua NEGATIVE NEGATIVE   Squamous Epithelial / LPF NONE SEEN 0 - 5   WBC, UA NONE SEEN 0 - 5 WBC/hpf    RBC / HPF 0-5 0 - 5 RBC/hpf   Bacteria, UA NONE SEEN NONE SEEN      Assessment & Plan:   Problem List Items Addressed This Visit       Endocrine   Uncontrolled type 1 diabetes mellitus with hyperglycemia, with long-term current use of insulin (HCC) - Primary    Stable with A1c of 7.9. Continue current regimen. Continue to monitor. Refills given.       Relevant Medications   atorvastatin (LIPITOR) 40 MG tablet   insulin glargine (LANTUS SOLOSTAR) 100 UNIT/ML Solostar Pen   Other Relevant Orders   Bayer DCA Hb A1c Waived     Follow up plan: Return in about 3 months (around 08/02/2022).

## 2022-06-20 ENCOUNTER — Telehealth: Payer: Self-pay

## 2022-06-20 NOTE — Telephone Encounter (Signed)
1 boxes of Lantus received for the patient. Called and notified patient that medication was ready to be picked up.

## 2022-07-25 NOTE — Telephone Encounter (Signed)
Medication picked up.

## 2022-08-02 ENCOUNTER — Ambulatory Visit: Payer: 59 | Admitting: Family Medicine

## 2022-08-27 ENCOUNTER — Other Ambulatory Visit: Payer: Self-pay | Admitting: Family Medicine

## 2022-08-28 NOTE — Telephone Encounter (Signed)
Requested Prescriptions  Pending Prescriptions Disp Refills   pantoprazole (PROTONIX) 40 MG tablet [Pharmacy Med Name: PANTOPRAZOLE SODIUM 40MG  TBEC] 90 tablet 0    Sig: TAKE ONE TABLET BY MOUTH EVERY DAY     Gastroenterology: Proton Pump Inhibitors Passed - 08/27/2022  6:21 PM      Passed - Valid encounter within last 12 months    Recent Outpatient Visits           3 months ago Uncontrolled type 1 diabetes mellitus with hyperglycemia, with long-term current use of insulin ([place])   [place], Megan P, DO   5 months ago Chronic traumatic pain   [place], Megan P, DO   6 months ago Hearing loss of left ear, unspecified hearing loss type   [facility], Megan P, DO   7 months ago Uncontrolled type 1 diabetes mellitus with hyperglycemia, with long-term current use of insulin ([place])   [place], Megan P, DO   8 months ago Chronic traumatic pain   [facility] [place], [place], DO

## 2023-02-06 ENCOUNTER — Encounter: Payer: Self-pay | Admitting: Family Medicine

## 2023-02-08 NOTE — Telephone Encounter (Signed)
Attempted to reach patient, LVM for patient to call office back.  Put in CRM.

## 2023-02-11 NOTE — Telephone Encounter (Signed)
Called and scheduled patient on 02/18/2023 @ 4:00 pm with Denny Peon.

## 2023-02-18 ENCOUNTER — Encounter: Payer: Self-pay | Admitting: Physician Assistant

## 2023-02-18 ENCOUNTER — Ambulatory Visit: Payer: 59 | Admitting: Physician Assistant

## 2023-02-18 VITALS — BP 114/77 | HR 80 | Ht 67.0 in | Wt 178.6 lb

## 2023-02-18 DIAGNOSIS — N529 Male erectile dysfunction, unspecified: Secondary | ICD-10-CM | POA: Diagnosis not present

## 2023-02-18 DIAGNOSIS — K219 Gastro-esophageal reflux disease without esophagitis: Secondary | ICD-10-CM | POA: Diagnosis not present

## 2023-02-18 DIAGNOSIS — E1065 Type 1 diabetes mellitus with hyperglycemia: Secondary | ICD-10-CM | POA: Diagnosis not present

## 2023-02-18 MED ORDER — PANTOPRAZOLE SODIUM 40 MG PO TBEC: 40.0000 mg | DELAYED_RELEASE_TABLET | Freq: Every day | ORAL | 0 refills | Status: DC

## 2023-02-18 MED ORDER — SILDENAFIL CITRATE 100 MG PO TABS
50.0000 mg | ORAL_TABLET | Freq: Every day | ORAL | 3 refills | Status: DC | PRN
Start: 2023-02-18 — End: 2023-10-17

## 2023-02-18 MED ORDER — LANTUS SOLOSTAR 100 UNIT/ML ~~LOC~~ SOPN: PEN_INJECTOR | SUBCUTANEOUS | 1 refills | Status: DC

## 2023-02-18 MED ORDER — METFORMIN HCL 500 MG PO TABS
1000.0000 mg | ORAL_TABLET | Freq: Two times a day (BID) | ORAL | 1 refills | Status: DC
Start: 2023-02-18 — End: 2023-05-22

## 2023-02-18 MED ORDER — PEN NEEDLES 30G X 5 MM MISC: 1.0000 | Freq: Two times a day (BID) | 6 refills | Status: DC

## 2023-02-18 NOTE — Progress Notes (Signed)
Established Patient Office Visit  Name: Alexis [name]   MRN: [id number]    DOB: Dec 28, 1985   Date:02/20/2023  Today's Provider: Jacquelin Hawking, MHS, PA-C Introduced myself to the patient as a PA-C and provided education on APPs in clinical practice.         Subjective  Chief Complaint  Chief Complaint  Patient presents with   Diabetes    Patient declines having a Diabetic Eye Exam.    Medication Management    Patient would like to discuss changing to Viagra vs Cialis as he feels it does not work for him.     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)  - Eye exam: Has not had recent eye exam- discussed importance of retinopathy screening  - Foot exam: UTD - Microalbumin: UTD - Statin: Not taking at this time  - PNA vaccine: NA - Denies symptoms of hypoglycemia, polyuria, polydipsia, numbness extremities, foot ulcers/trauma  GERD He reports symptoms of heartburn about 2 times per week  He states he is taking Protonix 40 mg PO at bedtime  He reports he is still eating spicy foods several times per week    Erectile Dysfunction  Was previously seen by Urology on 02/20/22 and started on Cialis for ED  He reports the Cialis has not been effective at managing his symptoms     Patient Active Problem List   Diagnosis Date Noted   Gastroesophageal reflux disease 02/20/2023   Erectile dysfunction 02/20/2023   Chronic traumatic pain 06/28/2021   TBI (traumatic brain injury) (HCC) 05/24/2021   Rupture of testis 05/24/2021   Cognitive communication deficit 05/24/2021   Closed nondisplaced pilon fracture of right tibia 05/24/2021   Closed type II fracture of occipital condyle (HCC)  05/24/2021   Hearing loss of left ear 05/24/2021   Seizures (HCC) 05/24/2021   Fracture of multiple pubic rami, right, closed, initial encounter (HCC) 05/24/2021   Subarachnoid hemorrhage following injury, no loss of consciousness (HCC) 04/17/2021   Subdural hematoma, post-traumatic (HCC) 04/16/2021   DKA (diabetic ketoacidosis) (HCC) 03/15/2021   C. difficile colitis 03/15/2021   Bipolar 2 disorder (HCC) 02/10/2018   Uncontrolled type 1 diabetes mellitus with hyperglycemia, with long-term current use of insulin (HCC) 01/20/2016    Past Surgical History:  Procedure Laterality Date   TONSILLECTOMY     TYMPANOSTOMY TUBE PLACEMENT      Family History  Problem Relation Age of Onset   Hyperlipidemia Mother    Hypertension Mother    Diabetes Father    Diabetes Maternal Grandmother        Lung    Social History   Tobacco Use   Smoking status: Every Day    Packs/day: 1    Types: Cigarettes    Passive exposure: Current   Smokeless tobacco: Never  Substance Use Topics   Alcohol use: No     Current Outpatient Medications:    sildenafil (VIAGRA) 100 MG tablet, Take 0.5-1 tablets (50-100 mg total) by mouth daily as needed for erectile dysfunction., Disp: 5 tablet, Rfl: 3   atorvastatin (LIPITOR) 40 MG tablet, Take 1 tablet (40 mg total)  by mouth once a week. (Patient not taking: Reported on 02/18/2023), Disp: 52 tablet, Rfl: 0   insulin glargine (LANTUS SOLOSTAR) 100 UNIT/ML Solostar Pen, ADMINISTER 15 UNITS UNDER THE SKIN DAILY, Disp: 3 mL, Rfl: 1   Insulin Pen Needle (PEN NEEDLES) 30G X 5 MM MISC, 1 each by Does not apply route 2 (two) times daily., Disp: 100 each, Rfl: 6   metFORMIN (GLUCOPHAGE) 500 MG tablet, Take 2 tablets (1,000 mg total) by mouth 2 (two) times daily with a meal., Disp: 360 tablet, Rfl: 1   naproxen (NAPROSYN) 500 MG tablet, Take 1 tablet (500 mg total) by mouth 2 (two) times daily with a meal. (Patient not taking: Reported on 05/03/2022), Disp: 30 tablet, Rfl:  0   pantoprazole (PROTONIX) 40 MG tablet, Take 1 tablet (40 mg total) by mouth daily., Disp: 90 tablet, Rfl: 0  No Known Allergies  I personally reviewed active problem list, medication list, allergies, health maintenance, notes from last encounter, lab results with the patient/caregiver today.   Review of Systems  Respiratory:  Negative for shortness of breath and wheezing.   Cardiovascular:  Negative for chest pain, palpitations and leg swelling.  Gastrointestinal:  Positive for heartburn. Negative for constipation, diarrhea, nausea and vomiting.  Genitourinary:  Negative for frequency.  Endo/Heme/Allergies:  Negative for polydipsia.      Objective  Vitals:   02/18/23 1612  BP: 114/77  Pulse: 80  SpO2: 95%  Weight: 178 lb 9.6 oz (81 kg)  Height: 5\' 7"  (1.702 m)    Body mass index is 27.97 kg/m.  Physical Exam Vitals reviewed.  Constitutional:      General: He is awake.     Appearance: Normal appearance. He is well-developed and well-groomed.  HENT:     Head: Normocephalic and atraumatic.  Pulmonary:     Effort: Pulmonary effort is normal.  Neurological:     General: No focal deficit present.     Mental Status: He is alert and oriented to person, place, and time.     GCS: GCS eye subscore is 4. GCS verbal subscore is 5. GCS motor subscore is 6.  Psychiatric:        Attention and Perception: Attention and perception normal.        Mood and Affect: Mood and affect normal.        Speech: Speech normal.        Behavior: Behavior normal. Behavior is cooperative.      Recent Results (from the past 2160 hour(s))  HgB A1c     Status: Abnormal   Collection Time: 02/18/23  4:41 PM  Result Value Ref Range   Hgb A1c MFr Bld 9.4 (H) 4.8 - 5.6 %    Comment:          Prediabetes: 5.7 - 6.4          Diabetes: >6.4          Glycemic control for adults with diabetes: <7.0    Est. average glucose Bld gHb Est-mCnc 223 mg/dL  Lipid Profile     Status: Abnormal    Collection Time: 02/18/23  4:41 PM  Result Value Ref Range   Cholesterol, Total 154 100 - 199 mg/dL   Triglycerides 454 (H) 0 - 149 mg/dL   HDL 50 >09 mg/dL   VLDL Cholesterol Cal 40 5 - 40 mg/dL   LDL Chol Calc (NIH) 64 0 - 99 mg/dL   Chol/HDL Ratio 3.1 0.0 - 5.0 ratio  Comment:                                   T. Chol/HDL Ratio                                             Men  Women                               1/2 Avg.Risk  3.4    3.3                                   Avg.Risk  5.0    4.4                                2X Avg.Risk  9.6    7.1                                3X Avg.Risk 23.4   11.0   Comp Met (CMET)     Status: Abnormal   Collection Time: 02/18/23  4:41 PM  Result Value Ref Range   Glucose 272 (H) 70 - 99 mg/dL   BUN 17 6 - 20 mg/dL   Creatinine, Ser 2.59 0.76 - 1.27 mg/dL   eGFR 563 >87 FI/EPP/2.95   BUN/Creatinine Ratio 18 9 - 20   Sodium 137 134 - 144 mmol/L   Potassium 4.2 3.5 - 5.2 mmol/L   Chloride 100 96 - 106 mmol/L   CO2 23 20 - 29 mmol/L   Calcium 9.3 8.7 - 10.2 mg/dL   Total Protein 6.7 6.0 - 8.5 g/dL   Albumin 4.5 4.1 - 5.1 g/dL   Globulin, Total 2.2 1.5 - 4.5 g/dL   Bilirubin Total <1.8 0.0 - 1.2 mg/dL   Alkaline Phosphatase 123 (H) 44 - 121 IU/L   AST 29 0 - 40 IU/L   ALT <5 0 - 44 IU/L    Comment: **Verified by repeat analysis**  CBC w/Diff     Status: None   Collection Time: 02/18/23  4:41 PM  Result Value Ref Range   WBC 8.0 3.4 - 10.8 x10E3/uL   RBC 4.81 4.14 - 5.80 x10E6/uL   Hemoglobin 13.9 13.0 - 17.7 g/dL   Hematocrit 84.1 66.0 - 51.0 %   MCV 86 79 - 97 fL   MCH 28.9 26.6 - 33.0 pg   MCHC 33.6 31.5 - 35.7 g/dL   RDW 63.0 16.0 - 10.9 %   Platelets 275 150 - 450 x10E3/uL   Neutrophils 55 Not Estab. %   Lymphs 34 Not Estab. %   Monocytes 7 Not Estab. %   Eos 3 Not Estab. %   Basos 1 Not Estab. %   Neutrophils Absolute 4.4 1.4 - 7.0 x10E3/uL   Lymphocytes Absolute 2.7 0.7 - 3.1 x10E3/uL   Monocytes Absolute 0.5 0.1 - 0.9  x10E3/uL   EOS (ABSOLUTE) 0.2 0.0 - 0.4 x10E3/uL   Basophils Absolute 0.1 0.0 - 0.2 x10E3/uL   Immature Granulocytes 0 Not Estab. %   Immature Grans (  Abs) 0.0 0.0 - 0.1 x10E3/uL     PHQ2/9:    05/03/2022    4:14 PM 03/30/2022    2:07 PM 02/26/2022    4:15 PM 01/25/2022    4:04 PM 10/17/2021   11:11 AM  Depression screen PHQ 2/9  Decreased Interest 1 0 0 0 0  Down, Depressed, Hopeless 0 0 0 0 0  PHQ - 2 Score 1 0 0 0 0  Altered sleeping 1 0 0 0 1  Tired, decreased energy 0 0 0 0 0  Change in appetite 0 0 0 0 1  Feeling bad or failure about yourself  0 0 0 0 0  Trouble concentrating 0 0 0 0 0  Moving slowly or fidgety/restless 0 0 0 0 0  Suicidal thoughts 0 0 0 0 0  PHQ-9 Score 2 0 0 0 2  Difficult doing work/chores Somewhat difficult  Not difficult at all Not difficult at all       Fall Risk:    02/26/2022    4:08 PM 01/25/2022    4:03 PM 03/10/2021    2:08 PM 12/09/2020    2:07 PM 02/10/2018    4:58 PM  Fall Risk   Falls in the past year? 0 0 0 0 No  Number falls in past yr: 0 0 0 0   Injury with Fall? 0 0 0 0   Risk for fall due to : No Fall Risks Impaired balance/gait No Fall Risks No Fall Risks   Follow up Falls evaluation completed Falls evaluation completed Falls evaluation completed Falls evaluation completed       Functional Status Survey:      Assessment & Plan  Problem List Items Addressed This Visit       Digestive   Gastroesophageal reflux disease    Chronic, historic condition Patient states that he is having symptoms of heartburn about 2 times per week and he is currently taking Protonix 40 mg p.o. nightly We discussed his diet and how this is directly correlated to heartburn exacerbations Recommend he stops eating as much spicy food as well as other known triggers Refills provided today, continue current regimen Follow-up in 3 months or sooner if concerns arise      Relevant Medications   pantoprazole (PROTONIX) 40 MG tablet     Endocrine    Uncontrolled type 1 diabetes mellitus with hyperglycemia, with long-term current use of insulin (HCC) - Primary    Chronic, historic condition Patient is currently taking metformin 1000 mg p.o. twice daily plus Lantus 20 units/day and appears to be tolerating well  Previous A1c was 7.9, will recheck today, results to dictate further management Patient is not currently taking a statin Will provide refills of medications Continue current regimen Follow-up in 3 months for monitoring      Relevant Medications   insulin glargine (LANTUS SOLOSTAR) 100 UNIT/ML Solostar Pen   Insulin Pen Needle (PEN NEEDLES) 30G X 5 MM MISC   metFORMIN (GLUCOPHAGE) 500 MG tablet   Other Relevant Orders   HgB A1c (Completed)   Lipid Profile (Completed)   Comp Met (CMET) (Completed)   CBC w/Diff (Completed)     Other   Erectile dysfunction    Chronic, historic condition Patient was previously evaluated by urology on 02/20/2022.   Review of their notes show that he was started on Cialis for erectile dysfunction Patient states that the Cialis is outside to be working very well at this time and is interested in switching  over to Viagra Prescription provided today Discussed potential correlation between diabetes and ED. Recommend getting diabetes under better control to improve ED  Follow up as needed for persistent or progressing symptoms        Relevant Medications   sildenafil (VIAGRA) 100 MG tablet     Return in about 3 months (around 05/21/2023) for Diabetes follow up.   I, Francesca Strome E Aadya Kindler, PA-C, have reviewed all documentation for this visit. The documentation on 02/20/23 for the exam, diagnosis, procedures, and orders are all accurate and complete.   Jacquelin Hawking, MHS, PA-C [facility]

## 2023-02-19 LAB — LIPID PANEL
Chol/HDL Ratio: 3.1 ratio (ref 0.0–5.0)
Cholesterol, Total: 154 mg/dL (ref 100–199)
HDL: 50 mg/dL (ref >39)
LDL Chol Calc (NIH): 64 mg/dL (ref 0–99)
Triglycerides: 253 mg/dL — ABNORMAL HIGH (ref 0–149)
VLDL Cholesterol Cal: 40 mg/dL (ref 5–40)

## 2023-02-19 LAB — COMPREHENSIVE METABOLIC PANEL
ALT: <5 IU/L (ref 0–44)
AST: 29 IU/L (ref 0–40)
Albumin: 4.5 g/dL (ref 4.1–5.1)
Alkaline Phosphatase: 123 IU/L — ABNORMAL HIGH (ref 44–121)
BUN/Creatinine Ratio: 18 (ref 9–20)
BUN: 17 mg/dL (ref 6–20)
Bilirubin Total: <0.2 mg/dL (ref 0.0–1.2)
CO2: 23 mmol/L (ref 20–29)
Calcium: 9.3 mg/dL (ref 8.7–10.2)
Chloride: 100 mmol/L (ref 96–106)
Creatinine, Ser: 0.93 mg/dL (ref 0.76–1.27)
Globulin, Total: 2.2 g/dL (ref 1.5–4.5)
Glucose: 272 mg/dL — ABNORMAL HIGH (ref 70–99)
Potassium: 4.2 mmol/L (ref 3.5–5.2)
Sodium: 137 mmol/L (ref 134–144)
Total Protein: 6.7 g/dL (ref 6.0–8.5)
eGFR: 107 mL/min/{1.73_m2} (ref >59)

## 2023-02-19 LAB — CBC WITH DIFFERENTIAL/PLATELET
Basophils Absolute: 0.1 10*3/uL (ref 0.0–0.2)
Basos: 1 %
EOS (ABSOLUTE): 0.2 10*3/uL (ref 0.0–0.4)
Eos: 3 %
Hematocrit: 41.4 % (ref 37.5–51.0)
Hemoglobin: 13.9 g/dL (ref 13.0–17.7)
Immature Grans (Abs): 0 10*3/uL (ref 0.0–0.1)
Immature Granulocytes: 0 %
Lymphocytes Absolute: 2.7 10*3/uL (ref 0.7–3.1)
Lymphs: 34 %
MCH: 28.9 pg (ref 26.6–33.0)
MCHC: 33.6 g/dL (ref 31.5–35.7)
MCV: 86 fL (ref 79–97)
Monocytes Absolute: 0.5 10*3/uL (ref 0.1–0.9)
Monocytes: 7 %
Neutrophils Absolute: 4.4 10*3/uL (ref 1.4–7.0)
Neutrophils: 55 %
Platelets: 275 10*3/uL (ref 150–450)
RBC: 4.81 x10E6/uL (ref 4.14–5.80)
RDW: 12.6 % (ref 11.6–15.4)
WBC: 8 10*3/uL (ref 3.4–10.8)

## 2023-02-19 LAB — HEMOGLOBIN A1C
Est. average glucose Bld gHb Est-mCnc: 223 mg/dL
Hgb A1c MFr Bld: 9.4 % — ABNORMAL HIGH (ref 4.8–5.6)

## 2023-02-20 DIAGNOSIS — K219 Gastro-esophageal reflux disease without esophagitis: Secondary | ICD-10-CM | POA: Insufficient documentation

## 2023-02-20 DIAGNOSIS — N529 Male erectile dysfunction, unspecified: Secondary | ICD-10-CM | POA: Insufficient documentation

## 2023-02-20 NOTE — Assessment & Plan Note (Signed)
Chronic, historic condition Patient is currently taking metformin 1000 mg p.o. twice daily plus Lantus 20 units/day and appears to be tolerating well  Previous A1c was 7.9, will recheck today, results to dictate further management Patient is not currently taking a statin Will provide refills of medications Continue current regimen Follow-up in 3 months for monitoring

## 2023-02-20 NOTE — Assessment & Plan Note (Signed)
Chronic, historic condition Patient was previously evaluated by urology on 02/20/2022.   Review of their notes show that he was started on Cialis for erectile dysfunction Patient states that the Cialis is outside to be working very well at this time and is interested in switching over to Viagra Prescription provided today Discussed potential correlation between diabetes and ED. Recommend getting diabetes under better control to improve ED  Follow up as needed for persistent or progressing symptoms

## 2023-02-20 NOTE — Assessment & Plan Note (Signed)
Chronic, historic condition Patient states that he is having symptoms of heartburn about 2 times per week and he is currently taking Protonix 40 mg p.o. nightly We discussed his diet and how this is directly correlated to heartburn exacerbations Recommend he stops eating as much spicy food as well as other known triggers Refills provided today, continue current regimen Follow-up in 3 months or sooner if concerns arise

## 2023-02-20 NOTE — Progress Notes (Signed)
Your labs have returned Your A1c has increased to 9.4.  If you are not doing so please resume taking your diabetes medications. Your cholesterol is overall pretty good.  Your triglycerides were little elevated but I suspect that this is likely due to labs being drawn when you were not fasting.  For now please continue with your current regimen Your electrolytes, liver and kidney function are overall in normal limits Your blood count is normal Please make sure to keep your follow-up appointment within 3 months so that we can recheck your A1c and make sure that it is improving.  Please let us know if you have further questions or concerns

## 2023-03-06 ENCOUNTER — Other Ambulatory Visit: Payer: Self-pay | Admitting: Family Medicine

## 2023-03-06 DIAGNOSIS — E1065 Type 1 diabetes mellitus with hyperglycemia: Secondary | ICD-10-CM

## 2023-03-07 ENCOUNTER — Other Ambulatory Visit: Payer: Self-pay

## 2023-03-07 DIAGNOSIS — E1065 Type 1 diabetes mellitus with hyperglycemia: Secondary | ICD-10-CM

## 2023-03-07 NOTE — Telephone Encounter (Signed)
Refilled 02/18/23 3 ml with 1 refill. Requested Prescriptions  Refused Prescriptions Disp Refills   insulin glargine (LANTUS SOLOSTAR) 100 UNIT/ML Solostar Pen [Pharmacy Med Name: Lantus SoloStar 100units/mL Pre-Filled Pen Solution for Injection] 3 mL 0    Sig: Inject 15 units under the skin daily.     Endocrinology:  Diabetes - Insulins Failed - 03/06/2023  9:10 PM      Failed - HBA1C is between 0 and 7.9 and within 180 days    Hemoglobin A1C  Date Value Ref Range Status  03/08/2017 6.1  Final   HB A1C (BAYER DCA - WAIVED)  Date Value Ref Range Status  05/03/2022 7.9 (H) 4.8 - 5.6 % Final    Comment:             Prediabetes: 5.7 - 6.4          Diabetes: >6.4          Glycemic control for adults with diabetes: <7.0    Hgb A1c MFr Bld  Date Value Ref Range Status  02/18/2023 9.4 (H) 4.8 - 5.6 % Final    Comment:             Prediabetes: 5.7 - 6.4          Diabetes: >6.4          Glycemic control for adults with diabetes: <7.0          Passed - Valid encounter within last 6 months    Recent Outpatient Visits           2 weeks ago Uncontrolled type 1 diabetes mellitus with hyperglycemia, with long-term current use of insulin (HCC)   [place] [facility] Mecum, Erin E, PA-C   10 months ago Uncontrolled type 1 diabetes mellitus with hyperglycemia, with long-term current use of insulin (HCC)   [place] [place], Megan P, DO   11 months ago Chronic traumatic pain   [place] [place], Megan P, DO   1 year ago Hearing loss of left ear, unspecified hearing loss type   [place] [facility] [place], Megan P, DO   1 year ago Uncontrolled type 1 diabetes mellitus with hyperglycemia, with long-term current use of insulin [facility])   [place] [facility] [place], Oralia Rud, DO       Future Appointments             In 3 weeks Laural Benes, Oralia Rud, DO [place] [facility], [facility]    In 2 months Laural Benes, Oralia Rud, DO [place] [facility], [facility]

## 2023-03-22 ENCOUNTER — Other Ambulatory Visit: Payer: Self-pay | Admitting: Family Medicine

## 2023-03-22 DIAGNOSIS — E1065 Type 1 diabetes mellitus with hyperglycemia: Secondary | ICD-10-CM

## 2023-03-25 NOTE — Telephone Encounter (Signed)
Requested Prescriptions  Pending Prescriptions Disp Refills   insulin glargine (LANTUS SOLOSTAR) 100 UNIT/ML Solostar Pen [Pharmacy Med Name: Lantus SoloStar Prefilled Pen 100unit/mL Solution for Injection] 3 mL 0    Sig: Inject 15 units under the skin daily.     Endocrinology:  Diabetes - Insulins Failed - 03/22/2023  9:59 AM      Failed - HBA1C is between 0 and 7.9 and within 180 days    Hemoglobin A1C  Date Value Ref Range Status  03/08/2017 6.1  Final   HB A1C (BAYER DCA - WAIVED)  Date Value Ref Range Status  05/03/2022 7.9 (H) 4.8 - 5.6 % Final    Comment:             Prediabetes: 5.7 - 6.4          Diabetes: >6.4          Glycemic control for adults with diabetes: <7.0    Hgb A1c MFr Bld  Date Value Ref Range Status  02/18/2023 9.4 (H) 4.8 - 5.6 % Final    Comment:             Prediabetes: 5.7 - 6.4          Diabetes: >6.4          Glycemic control for adults with diabetes: <7.0          Passed - Valid encounter within last 6 months    Recent Outpatient Visits           1 month ago Uncontrolled type 1 diabetes mellitus with hyperglycemia, with long-term current use of insulin (HCC)   [place] [facility] Mecum, Erin E, PA-C   10 months ago Uncontrolled type 1 diabetes mellitus with hyperglycemia, with long-term current use of insulin (HCC)   [place] [facility] [place], Megan P, DO   12 months ago Chronic traumatic pain   [place] [facility] [place], Megan P, DO   1 year ago Hearing loss of left ear, unspecified hearing loss type   [place] [facility] [place], Megan P, DO   1 year ago Uncontrolled type 1 diabetes mellitus with hyperglycemia, with long-term current use of insulin [facility])   [place] [facility] [place], Oralia Rud, DO       Future Appointments             In 4 days Dorcas Carrow, DO [place] [facility], [facility]   In 1 month [place], Oralia Rud, DO [facility], [facility]

## 2023-03-29 ENCOUNTER — Ambulatory Visit: Payer: 59 | Admitting: Family Medicine

## 2023-04-01 ENCOUNTER — Other Ambulatory Visit: Payer: Self-pay | Admitting: Physician Assistant

## 2023-04-01 DIAGNOSIS — K219 Gastro-esophageal reflux disease without esophagitis: Secondary | ICD-10-CM

## 2023-04-03 NOTE — Telephone Encounter (Signed)
Request is too soon, last refill 02/18/23 for 90 days.  Requested Prescriptions  Pending Prescriptions Disp Refills   pantoprazole (PROTONIX) 40 MG tablet [Pharmacy Med Name: Pantoprazole Sodium 40mg  Delayed-Release Tablet] 90 tablet 0    Sig: Take 1 tablet by mouth daily.     Gastroenterology: Proton Pump Inhibitors Passed - 04/01/2023 12:41 PM      Passed - Valid encounter within last 12 months    Recent Outpatient Visits           1 month ago Uncontrolled type 1 diabetes mellitus with hyperglycemia, with long-term current use of insulin (HCC)   [place] [facility] Mec[facility], Erin E, PA-C   11 months ago Uncontrolled type 1 diabetes mellitus with hyperglycemia, with long-term current use of insulin (HCC)   [place] [facility] [place], Megan P, DO   1 year ago Chronic traumatic pain   [place] [facility] [place], Megan P, DO   1 year ago Hearing loss of left ear, unspecified hearing loss type   [place] [facility] [place], Megan P, DO   1 year ago Uncontrolled type 1 diabetes mellitus with hyperglycemia, with long-term current use of insulin [facility])   [place] [facility] [place], Oralia Rud, DO       Future Appointments             In 1 month [place], Oralia Rud, DO [place] [facility], [facility]

## 2023-04-14 ENCOUNTER — Encounter: Payer: Self-pay | Admitting: Family Medicine

## 2023-04-14 ENCOUNTER — Other Ambulatory Visit: Payer: Self-pay | Admitting: Family Medicine

## 2023-04-14 DIAGNOSIS — E1065 Type 1 diabetes mellitus with hyperglycemia: Secondary | ICD-10-CM

## 2023-04-16 ENCOUNTER — Other Ambulatory Visit: Payer: Self-pay | Admitting: Pediatrics

## 2023-04-16 DIAGNOSIS — E1065 Type 1 diabetes mellitus with hyperglycemia: Secondary | ICD-10-CM

## 2023-04-16 MED ORDER — LANTUS SOLOSTAR 100 UNIT/ML ~~LOC~~ SOPN
PEN_INJECTOR | SUBCUTANEOUS | 0 refills | Status: DC
Start: 2023-04-16 — End: 2023-05-05

## 2023-04-16 NOTE — Progress Notes (Signed)
Requesting 6mL vs 3mL per pharmacy/insurance.

## 2023-04-17 NOTE — Telephone Encounter (Signed)
Unable to refill per protocol, Rx request was refilled 04/16/23, duplicate request.E-Prescribing Status: Receipt confirmed by pharmacy (04/16/2023  3:09 PM EDT).  Requested Prescriptions  Pending Prescriptions Disp Refills   LANTUS SOLOSTAR 100 UNIT/ML Solostar Pen [Pharmacy Med Name: Lantus SoloStar Prefilled Pen 100unit/mL Solution for Injection] 3 mL 0    Sig: Inject 15 units under the skin daily.     Endocrinology:  Diabetes - Insulins Failed - 04/14/2023  8:13 PM      Failed - HBA1C is between 0 and 7.9 and within 180 days    Hemoglobin A1C  Date Value Ref Range Status  03/08/2017 6.1  Final   HB A1C (BAYER DCA - WAIVED)  Date Value Ref Range Status  05/03/2022 7.9 (H) 4.8 - 5.6 % Final    Comment:             Prediabetes: 5.7 - 6.4          Diabetes: >6.4          Glycemic control for adults with diabetes: <7.0    Hgb A1c MFr Bld  Date Value Ref Range Status  02/18/2023 9.4 (H) 4.8 - 5.6 % Final    Comment:             Prediabetes: 5.7 - 6.4          Diabetes: >6.4          Glycemic control for adults with diabetes: <7.0          Passed - Valid encounter within last 6 months    Recent Outpatient Visits           1 month ago Uncontrolled type 1 diabetes mellitus with hyperglycemia, with long-term current use of insulin (HCC)   [place] [facility] Mecum, Erin E, PA-C   11 months ago Uncontrolled type 1 diabetes mellitus with hyperglycemia, with long-term current use of insulin (HCC)   [place] [facility] [place], [place], DO   1 year ago Chronic traumatic pain   [place] [facility] [place], Megan P, DO   1 year ago Hearing loss of left ear, unspecified hearing loss type   [place] [facility] [place], Megan P, DO   1 year ago Uncontrolled type 1 diabetes mellitus with hyperglycemia, with long-term current use of insulin [facility])   [place] [facility] [place], Oralia Rud, DO       Future  Appointments             In 1 month [place], Oralia Rud, DO [place] [facility], [facility]

## 2023-04-21 NOTE — Telephone Encounter (Signed)
Needs sooner appt if possible

## 2023-04-22 NOTE — Telephone Encounter (Signed)
Called and scheduled patient on 05/16/2023 @ 4:00 pm.

## 2023-05-02 ENCOUNTER — Other Ambulatory Visit: Payer: Self-pay | Admitting: Pediatrics

## 2023-05-02 DIAGNOSIS — E1065 Type 1 diabetes mellitus with hyperglycemia: Secondary | ICD-10-CM

## 2023-05-03 NOTE — Telephone Encounter (Signed)
Requested medications are due for refill today.  A little soon  Requested medications are on the active medications list.  yes  Last refill. 04/16/2023  6mL a 40 day supply  Future visit scheduled.   yes  Notes to clinic.  Rx signed by Modena Nunnery.    Requested Prescriptions  Pending Prescriptions Disp Refills   LANTUS SOLOSTAR 100 UNIT/ML Solostar Pen [Pharmacy Med Name: Lantus SoloStar Prefilled Pen 100unit/mL Solution for Injection] 6 mL 0    Sig: Inject 15 units under the skin daily.     Endocrinology:  Diabetes - Insulins Failed - 05/02/2023  8:35 AM      Failed - HBA1C is between 0 and 7.9 and within 180 days    Hemoglobin A1C  Date Value Ref Range Status  03/08/2017 6.1  Final   HB A1C (BAYER DCA - WAIVED)  Date Value Ref Range Status  05/03/2022 7.9 (H) 4.8 - 5.6 % Final    Comment:             Prediabetes: 5.7 - 6.4          Diabetes: >6.4          Glycemic control for adults with diabetes: <7.0    Hgb A1c MFr Bld  Date Value Ref Range Status  02/18/2023 9.4 (H) 4.8 - 5.6 % Final    Comment:             Prediabetes: 5.7 - 6.4          Diabetes: >6.4          Glycemic control for adults with diabetes: <7.0          Passed - Valid encounter within last 6 months    Recent Outpatient Visits           2 months ago Uncontrolled type 1 diabetes mellitus with hyperglycemia, with long-term current use of insulin (HCC)   [place] [facility] Mecum, Erin E, PA-C   1 year ago Uncontrolled type 1 diabetes mellitus with hyperglycemia, with long-term current use of insulin (HCC)   [place] [facility] [place], [place], DO   1 year ago Chronic traumatic pain   [place] [facility] [place], Megan P, DO   1 year ago Hearing loss of left ear, unspecified hearing loss type   [place] [facility] [place], Megan P, DO   1 year ago Uncontrolled type 1 diabetes mellitus with hyperglycemia, with long-term current use  of insulin [facility])   [place] [facility] [place], Oralia Rud, DO       Future Appointments             In 1 week Laural Benes, Oralia Rud, DO  [facility], [facility]

## 2023-05-16 ENCOUNTER — Encounter: Payer: Self-pay | Admitting: Family Medicine

## 2023-05-16 ENCOUNTER — Ambulatory Visit (INDEPENDENT_AMBULATORY_CARE_PROVIDER_SITE_OTHER): Payer: 59 | Admitting: Family Medicine

## 2023-05-16 VITALS — BP 118/80 | HR 77 | Ht 67.0 in | Wt 173.0 lb

## 2023-05-16 DIAGNOSIS — E1065 Type 1 diabetes mellitus with hyperglycemia: Secondary | ICD-10-CM

## 2023-05-16 DIAGNOSIS — F3181 Bipolar II disorder: Secondary | ICD-10-CM

## 2023-05-16 DIAGNOSIS — F40240 Claustrophobia: Secondary | ICD-10-CM | POA: Insufficient documentation

## 2023-05-16 DIAGNOSIS — K219 Gastro-esophageal reflux disease without esophagitis: Secondary | ICD-10-CM | POA: Diagnosis not present

## 2023-05-16 LAB — MICROALBUMIN, URINE WAIVED
Creatinine, Urine Waived: 300 mg/dL (ref 10–300)
Microalb, Ur Waived: 150 mg/L — ABNORMAL HIGH (ref 0–19)

## 2023-05-16 LAB — URINALYSIS, ROUTINE W REFLEX MICROSCOPIC
Bilirubin, UA: NEGATIVE
Glucose, UA: NEGATIVE
Ketones, UA: NEGATIVE
Leukocytes,UA: NEGATIVE
Nitrite, UA: NEGATIVE
RBC, UA: NEGATIVE
Specific Gravity, UA: >1.03 — ABNORMAL HIGH (ref 1.005–1.030)
Urobilinogen, Ur: 0.2 mg/dL (ref 0.2–1.0)
pH, UA: 6.5 (ref 5.0–7.5)

## 2023-05-16 LAB — MICROSCOPIC EXAMINATION
Bacteria, UA: NONE SEEN
Epithelial Cells (non renal): NONE SEEN /[HPF] (ref 0–10)
WBC, UA: NONE SEEN /[HPF] (ref 0–5)

## 2023-05-16 MED ORDER — ATORVASTATIN CALCIUM 40 MG PO TABS: 40.0000 mg | ORAL_TABLET | ORAL | 0 refills | Status: DC

## 2023-05-16 MED ORDER — BUSPIRONE HCL 5 MG PO TABS
5.0000 mg | ORAL_TABLET | Freq: Two times a day (BID) | ORAL | 1 refills | Status: DC
Start: 2023-05-16 — End: 2023-09-06

## 2023-05-16 MED ORDER — PANTOPRAZOLE SODIUM 40 MG PO TBEC
40.0000 mg | DELAYED_RELEASE_TABLET | Freq: Every day | ORAL | 0 refills | Status: DC
Start: 2023-05-16 — End: 2023-08-26

## 2023-05-16 NOTE — Assessment & Plan Note (Signed)
Doing well not on medicine. Continue to monitor.

## 2023-05-16 NOTE — Assessment & Plan Note (Signed)
With riding elevators. Will start him on buspar PRN. Call with any concerns.

## 2023-05-16 NOTE — Assessment & Plan Note (Signed)
Likely uncontrolled. Rechecking A1c today and will adjust dose as needed. Call with any concerns. Restart atorvastatin. Labs drawn today.

## 2023-05-16 NOTE — Progress Notes (Signed)
BP 118/80   Pulse 77   Ht 5\' 7"  (1.702 m)   Wt 173 lb (78.5 kg)   SpO2 97%   BMI 27.10 kg/m    Subjective:    Patient ID: Alexis [name], male    DOB: Dec 31, 1985, 37 y.o.   MRN: [id number]  HPI: Alexis [name] is a 37 y.o. male  Chief Complaint  Patient presents with   Diabetes   DIABETES- had not been taking his metformin in the AM, only restarted it about 2 months ago.  Hypoglycemic episodes:yes- had one this AM, unsure how low he's been going Polydipsia/polyuria: no Visual disturbance: no Chest pain: no Paresthesias: no Glucose Monitoring: no  Accucheck frequency: Not Checking Taking Insulin?: yes  Long acting insulin: 20 units lantus Blood Pressure Monitoring: not checking Retinal Examination: Not up to Date Foot Exam: Up to Date Diabetic Education: Completed Pneumovax: Up to Date Influenza: Not up to Date Aspirin: no  GERD GERD control status: stable Satisfied with current treatment? yes Heartburn frequency: Occasionally Medication side effects: no  Medication compliance: Fair Dysphagia: no Odynophagia:  no Hematemesis: no Blood in stool: no EGD: N/A  Relevant past medical, surgical, family and social history reviewed and updated as indicated. Interim medical history since our last visit reviewed. Allergies and medications reviewed and updated.  Review of Systems  Constitutional: Negative.   Respiratory: Negative.    Cardiovascular: Negative.   Gastrointestinal: Negative.   Musculoskeletal: Negative.   Neurological: Negative.   Psychiatric/Behavioral: Negative.      Per HPI unless specifically indicated above     Objective:    BP 118/80   Pulse 77   Ht 5\' 7"  (1.702 m)   Wt 173 lb (78.5 kg)   SpO2 97%   BMI 27.10 kg/m   Wt Readings from Last 3 Encounters:  05/16/23 173 lb (78.5 kg)  02/18/23 178 lb 9.6 oz (81 kg)  05/03/22 173 lb 6.4 oz (78.7 kg)    Physical Exam Vitals and nursing note reviewed.  Constitutional:       General: He is not in acute distress.    Appearance: Normal appearance. He is not ill-appearing, toxic-appearing or diaphoretic.  HENT:     Head: Normocephalic and atraumatic.     Right Ear: External ear normal.     Left Ear: External ear normal.     Nose: Nose normal.     Mouth/Throat:     Mouth: Mucous membranes are moist.     Pharynx: Oropharynx is clear.  Eyes:     General: No scleral icterus.       Right eye: No discharge.        Left eye: No discharge.     Extraocular Movements: Extraocular movements intact.     Conjunctiva/sclera: Conjunctivae normal.     Pupils: Pupils are equal, round, and reactive to light.  Cardiovascular:     Rate and Rhythm: Normal rate and regular rhythm.     Pulses: Normal pulses.     Heart sounds: Normal heart sounds. No murmur heard.    No friction rub. No gallop.  Pulmonary:     Effort: Pulmonary effort is normal. No respiratory distress.     Breath sounds: Normal breath sounds. No stridor. No wheezing, rhonchi or rales.  Chest:     Chest wall: No tenderness.  Musculoskeletal:        General: Normal range of motion.     Cervical back: Normal range of motion and  neck supple.  Skin:    General: Skin is warm and dry.     Capillary Refill: Capillary refill takes less than 2 seconds.     Coloration: Skin is not jaundiced or pale.     Findings: No bruising, erythema, lesion or rash.  Neurological:     General: No focal deficit present.     Mental Status: He is alert and oriented to person, place, and time. Mental status is at baseline.  Psychiatric:        Mood and Affect: Mood normal.        Behavior: Behavior normal.        Thought Content: Thought content normal.        Judgment: Judgment normal.     Results for orders placed or performed in visit on 02/18/23  HgB A1c  Result Value Ref Range   Hgb A1c MFr Bld 9.4 (H) 4.8 - 5.6 %   Est. average glucose Bld gHb Est-mCnc 223 mg/dL  Lipid Profile  Result Value Ref Range   Cholesterol,  Total 154 100 - 199 mg/dL   Triglycerides 161 (H) 0 - 149 mg/dL   HDL 50 >09 mg/dL   VLDL Cholesterol Cal 40 5 - 40 mg/dL   LDL Chol Calc (NIH) 64 0 - 99 mg/dL   Chol/HDL Ratio 3.1 0.0 - 5.0 ratio  Comp Met (CMET)  Result Value Ref Range   Glucose 272 (H) 70 - 99 mg/dL   BUN 17 6 - 20 mg/dL   Creatinine, Ser 6.04 0.76 - 1.27 mg/dL   eGFR 540 >98 JX/BJY/7.82   BUN/Creatinine Ratio 18 9 - 20   Sodium 137 134 - 144 mmol/L   Potassium 4.2 3.5 - 5.2 mmol/L   Chloride 100 96 - 106 mmol/L   CO2 23 20 - 29 mmol/L   Calcium 9.3 8.7 - 10.2 mg/dL   Total Protein 6.7 6.0 - 8.5 g/dL   Albumin 4.5 4.1 - 5.1 g/dL   Globulin, Total 2.2 1.5 - 4.5 g/dL   Bilirubin Total <9.5 0.0 - 1.2 mg/dL   Alkaline Phosphatase 123 (H) 44 - 121 IU/L   AST 29 0 - 40 IU/L   ALT <5 0 - 44 IU/L  CBC w/Diff  Result Value Ref Range   WBC 8.0 3.4 - 10.8 x10E3/uL   RBC 4.81 4.14 - 5.80 x10E6/uL   Hemoglobin 13.9 13.0 - 17.7 g/dL   Hematocrit 62.1 30.8 - 51.0 %   MCV 86 79 - 97 fL   MCH 28.9 26.6 - 33.0 pg   MCHC 33.6 31.5 - 35.7 g/dL   RDW 65.7 84.6 - 96.2 %   Platelets 275 150 - 450 x10E3/uL   Neutrophils 55 Not Estab. %   Lymphs 34 Not Estab. %   Monocytes 7 Not Estab. %   Eos 3 Not Estab. %   Basos 1 Not Estab. %   Neutrophils Absolute 4.4 1.4 - 7.0 x10E3/uL   Lymphocytes Absolute 2.7 0.7 - 3.1 x10E3/uL   Monocytes Absolute 0.5 0.1 - 0.9 x10E3/uL   EOS (ABSOLUTE) 0.2 0.0 - 0.4 x10E3/uL   Basophils Absolute 0.1 0.0 - 0.2 x10E3/uL   Immature Granulocytes 0 Not Estab. %   Immature Grans (Abs) 0.0 0.0 - 0.1 x10E3/uL      Assessment & Plan:   Problem List Items Addressed This Visit       Digestive   Gastroesophageal reflux disease    Under good control on current regimen. Continue current  regimen. Continue to monitor. Call with any concerns. Refills given.        Relevant Medications   pantoprazole (PROTONIX) 40 MG tablet     Endocrine   Uncontrolled type 1 diabetes mellitus with hyperglycemia,  with long-term current use of insulin (HCC) - Primary    Likely uncontrolled. Rechecking A1c today and will adjust dose as needed. Call with any concerns. Restart atorvastatin. Labs drawn today.      Relevant Medications   atorvastatin (LIPITOR) 40 MG tablet   Other Relevant Orders   Comprehensive metabolic panel   CBC with Differential/Platelet   Lipid Panel w/o Chol/HDL Ratio   TSH   Urinalysis, Routine w reflex microscopic   Microalbumin, Urine Waived   Hgb A1c w/o eAG     Other   Bipolar 2 disorder (HCC)    Doing well not on medicine. Continue to monitor.       Claustrophobia    With riding elevators. Will start him on buspar PRN. Call with any concerns.         Follow up plan: Return in about 3 months (around 08/16/2023).

## 2023-05-16 NOTE — Assessment & Plan Note (Signed)
Under good control on current regimen. Continue current regimen. Continue to monitor. Call with any concerns. Refills given.   

## 2023-05-17 LAB — CBC WITH DIFFERENTIAL/PLATELET
Basophils Absolute: 0.1 10*3/uL (ref 0.0–0.2)
Basos: 1 %
EOS (ABSOLUTE): 0.2 10*3/uL (ref 0.0–0.4)
Eos: 2 %
Hematocrit: 42.7 % (ref 37.5–51.0)
Hemoglobin: 14.4 g/dL (ref 13.0–17.7)
Immature Grans (Abs): 0 10*3/uL (ref 0.0–0.1)
Immature Granulocytes: 0 %
Lymphocytes Absolute: 2.5 10*3/uL (ref 0.7–3.1)
Lymphs: 31 %
MCH: 29.3 pg (ref 26.6–33.0)
MCHC: 33.7 g/dL (ref 31.5–35.7)
MCV: 87 fL (ref 79–97)
Monocytes Absolute: 0.6 10*3/uL (ref 0.1–0.9)
Monocytes: 8 %
Neutrophils Absolute: 4.6 10*3/uL (ref 1.4–7.0)
Neutrophils: 58 %
Platelets: 287 10*3/uL (ref 150–450)
RBC: 4.91 x10E6/uL (ref 4.14–5.80)
RDW: 13.2 % (ref 11.6–15.4)
WBC: 8 10*3/uL (ref 3.4–10.8)

## 2023-05-17 LAB — LIPID PANEL W/O CHOL/HDL RATIO
Cholesterol, Total: 130 mg/dL (ref 100–199)
HDL: 52 mg/dL (ref >39)
LDL Chol Calc (NIH): 57 mg/dL (ref 0–99)
Triglycerides: 114 mg/dL (ref 0–149)
VLDL Cholesterol Cal: 21 mg/dL (ref 5–40)

## 2023-05-17 LAB — COMPREHENSIVE METABOLIC PANEL
ALT: <5 [IU]/L (ref 0–44)
AST: 24 [IU]/L (ref 0–40)
Albumin: 4.7 g/dL (ref 4.1–5.1)
Alkaline Phosphatase: 84 [IU]/L (ref 44–121)
BUN/Creatinine Ratio: 24 — ABNORMAL HIGH (ref 9–20)
BUN: 21 mg/dL — ABNORMAL HIGH (ref 6–20)
Bilirubin Total: 0.2 mg/dL (ref 0.0–1.2)
CO2: 25 mmol/L (ref 20–29)
Calcium: 9.5 mg/dL (ref 8.7–10.2)
Chloride: 102 mmol/L (ref 96–106)
Creatinine, Ser: 0.86 mg/dL (ref 0.76–1.27)
Globulin, Total: 1.9 g/dL (ref 1.5–4.5)
Glucose: 103 mg/dL — ABNORMAL HIGH (ref 70–99)
Potassium: 4 mmol/L (ref 3.5–5.2)
Sodium: 139 mmol/L (ref 134–144)
Total Protein: 6.6 g/dL (ref 6.0–8.5)
eGFR: 113 mL/min/{1.73_m2} (ref >59)

## 2023-05-17 LAB — TSH: TSH: 0.586 u[IU]/mL (ref 0.450–4.500)

## 2023-05-17 LAB — HGB A1C W/O EAG: Hgb A1c MFr Bld: 9.1 % — ABNORMAL HIGH (ref 4.8–5.6)

## 2023-05-21 ENCOUNTER — Ambulatory Visit: Payer: 59 | Admitting: Family Medicine

## 2023-05-22 ENCOUNTER — Other Ambulatory Visit: Payer: Self-pay | Admitting: Family Medicine

## 2023-05-22 DIAGNOSIS — E1065 Type 1 diabetes mellitus with hyperglycemia: Secondary | ICD-10-CM

## 2023-05-22 MED ORDER — LANTUS SOLOSTAR 100 UNIT/ML ~~LOC~~ SOPN
PEN_INJECTOR | SUBCUTANEOUS | 0 refills | Status: DC
Start: 2023-05-22 — End: 2023-06-04

## 2023-05-22 MED ORDER — METFORMIN HCL 500 MG PO TABS
1000.0000 mg | ORAL_TABLET | Freq: Two times a day (BID) | ORAL | 1 refills | Status: DC
Start: 2023-05-22 — End: 2023-08-27

## 2023-05-22 MED ORDER — EMPAGLIFLOZIN 25 MG PO TABS: 25.0000 mg | ORAL_TABLET | Freq: Every day | ORAL | 2 refills | Status: DC

## 2023-05-27 IMAGING — CT CT ABD-PELV W/O CM
2 of 4 series · 16 of 46 positions shown, 18 images · non-contrast
Comparison: None.

CLINICAL DATA: Severe abdominal pain, nausea, and diarrhea.

EXAM:
CT ABDOMEN AND PELVIS WITHOUT CONTRAST
TECHNIQUE: Multidetector CT imaging of the abdomen and pelvis was performed
following the standard protocol without IV contrast.

[Series 2: axials routine abdomen pelvis without 5.00 · axial · non-contrast · 0.69mm/px · z∈[-1576,-1136]mm · 13 of 96 slices shown, 15 images]
[im 4/96  soft-tissue]
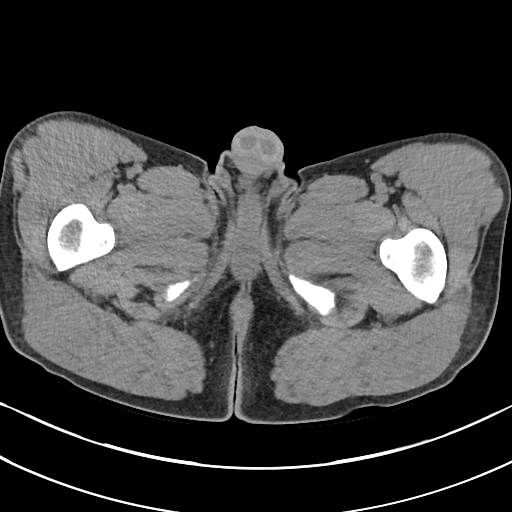
[im 4/96  bone]
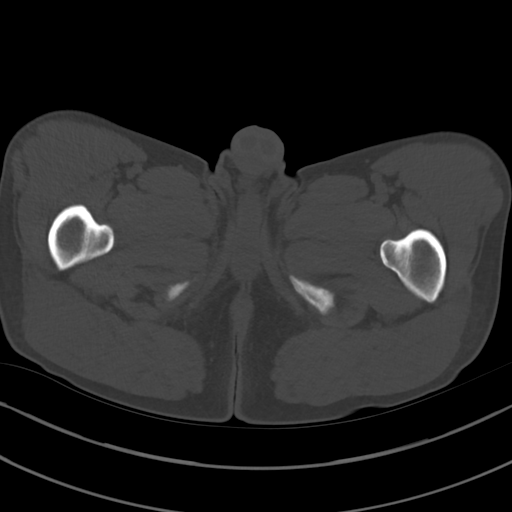
[im 12/96  soft-tissue]
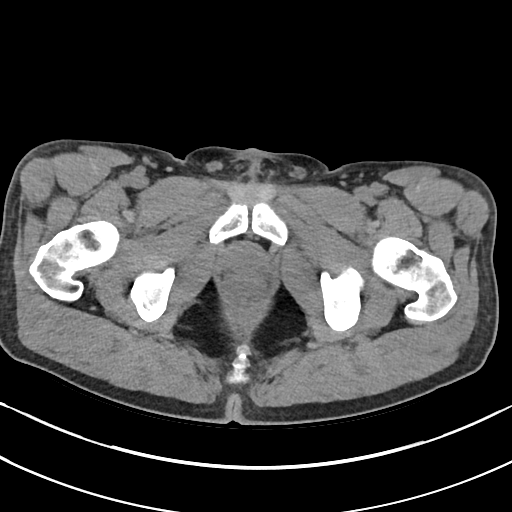
[im 20/96  soft-tissue]
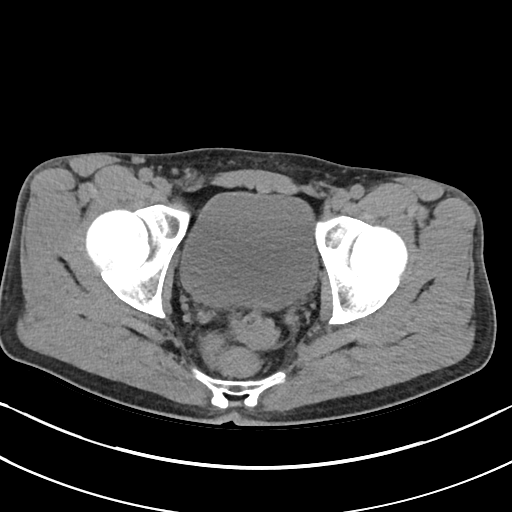
[im 28/96  soft-tissue]
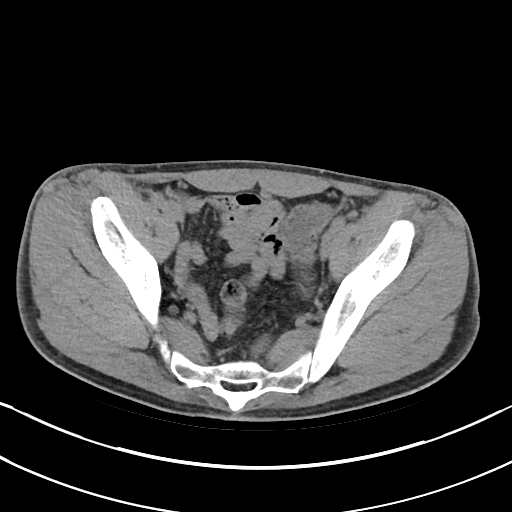
[im 32/96  soft-tissue]
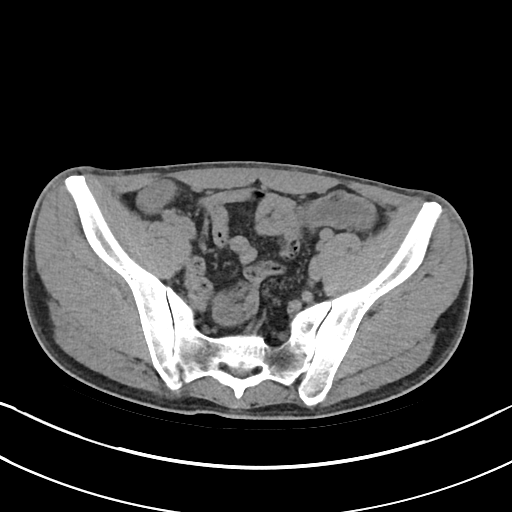
[im 40/96  soft-tissue]
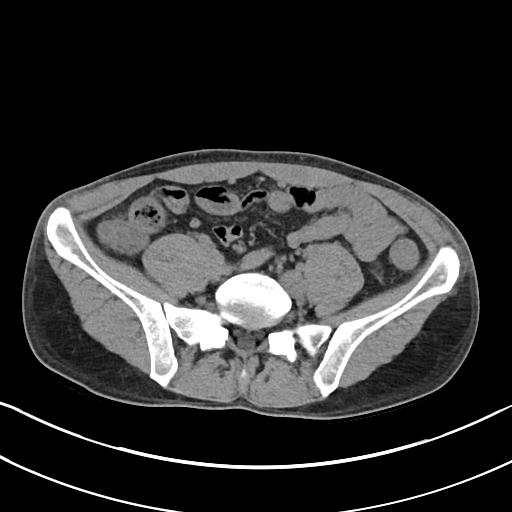
[im 48/96  soft-tissue]
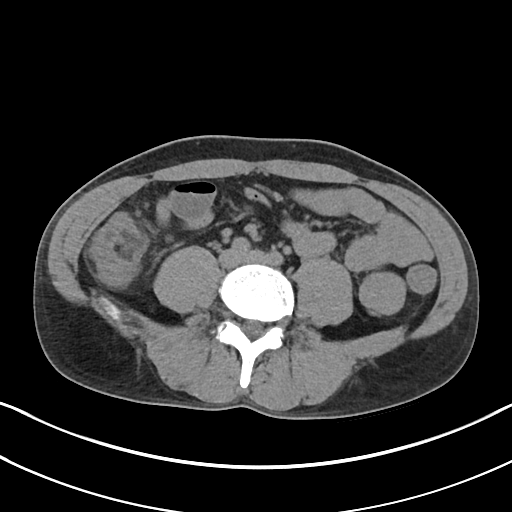
[im 56/96  soft-tissue]
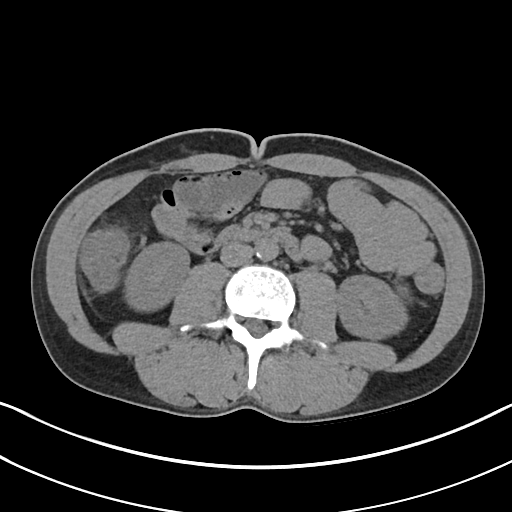
[im 64/96  soft-tissue]
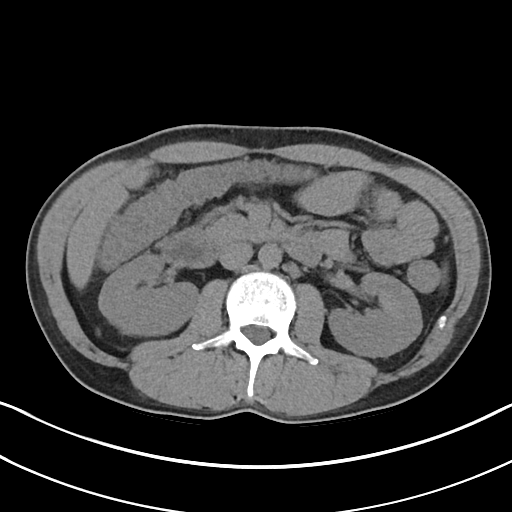
[im 64/96  bone]
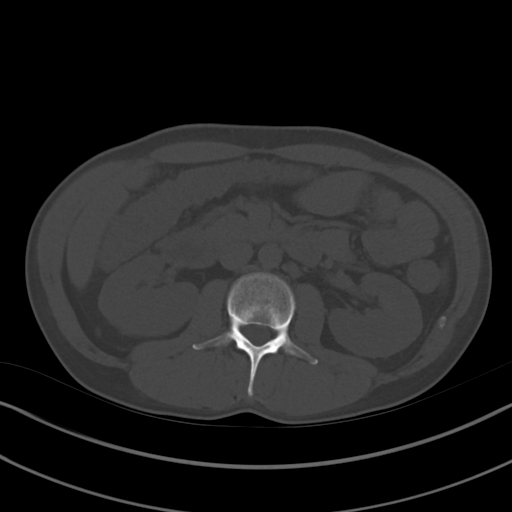
[im 68/96  soft-tissue]
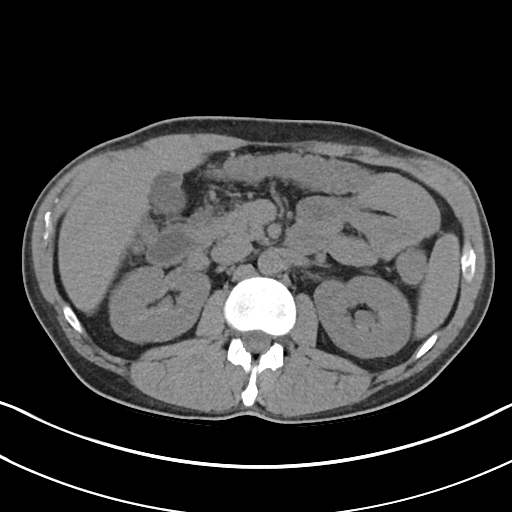
[im 76/96  soft-tissue]
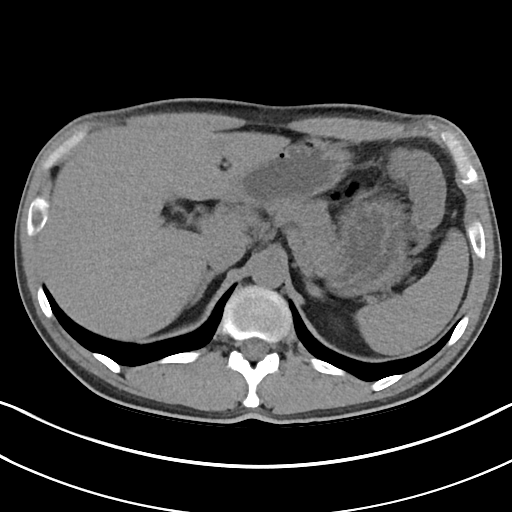
[im 84/96  soft-tissue]
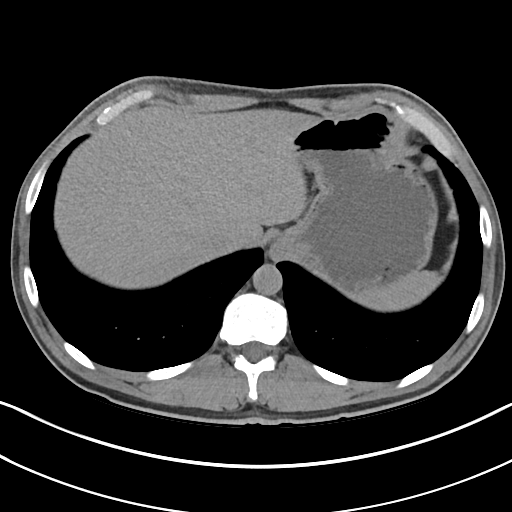
[im 92/96  soft-tissue]
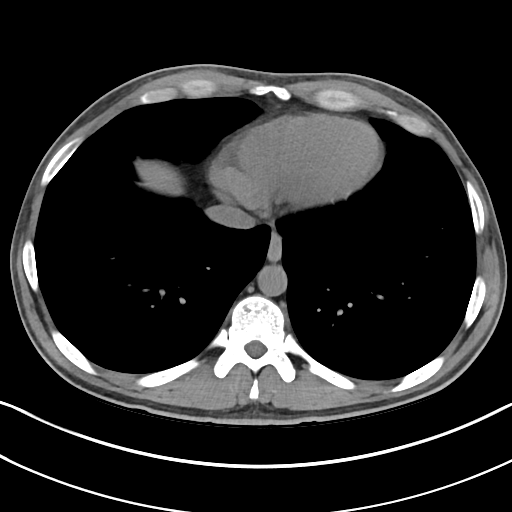

[Series 4: coronals routine abdomen pelvis without 2.00 cor · coronal · non-contrast · 0.69mm/px · 3 of 126 slices shown]
[im 42/126  soft-tissue]
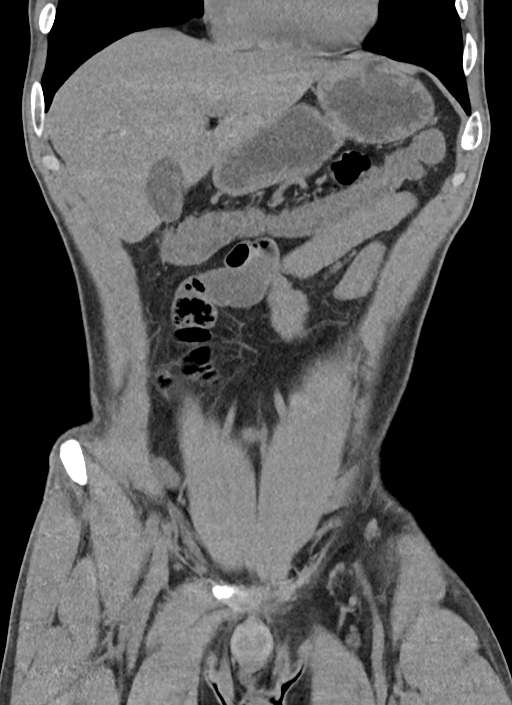
[im 56/126  soft-tissue]
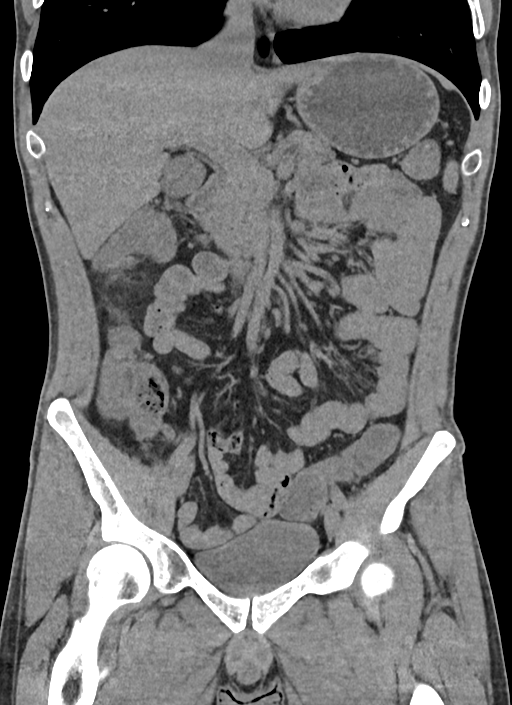
[im 70/126  soft-tissue]
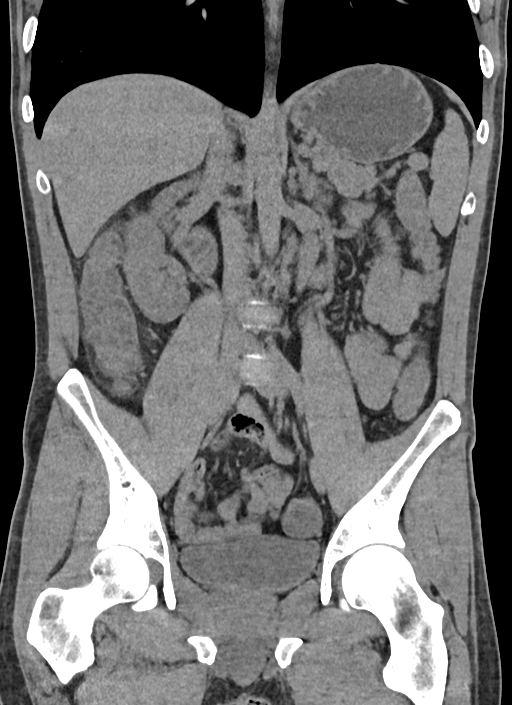

[16 of 46 positions shown; findings below may reference images not displayed]

FINDINGS: Lower chest: No acute findings.

Hepatobiliary: No mass visualized on this unenhanced exam.
Gallbladder is unremarkable. No evidence of biliary ductal
dilatation.

Pancreas: No mass or inflammatory process visualized on this
unenhanced exam.

Spleen:  Within normal limits in size.

Adrenals/Urinary tract: No evidence of urolithiasis or
hydronephrosis. Unremarkable unopacified urinary bladder.

Stomach/Bowel: Moderate wall thickening is seen involving the
ascending colon with pericolonic soft tissue stranding. This is
consistent with a right-sided colitis. No other inflammatory process
or abnormal fluid collections are seen. Normal appendix visualized.

Vascular/Lymphatic: No pathologically enlarged lymph nodes
identified. No evidence of abdominal aortic aneurysm.

Reproductive:  No mass or other significant abnormality.

Other:  None.

Musculoskeletal:  No suspicious bone lesions identified.
IMPRESSION: Moderate right-sided colitis. No evidence of abscess or other
complication.

## 2023-06-02 ENCOUNTER — Other Ambulatory Visit: Payer: Self-pay | Admitting: Family Medicine

## 2023-06-02 DIAGNOSIS — E1065 Type 1 diabetes mellitus with hyperglycemia: Secondary | ICD-10-CM

## 2023-06-04 NOTE — Telephone Encounter (Signed)
Requested Prescriptions  Pending Prescriptions Disp Refills   LANTUS SOLOSTAR 100 UNIT/ML Solostar Pen [Pharmacy Med Name: Lantus SoloStar Prefilled Pen 100unit/mL Solution for Injection] 3 mL 0    Sig: Inject 15 units under the skin daily.     Endocrinology:  Diabetes - Insulins Failed - 06/02/2023  8:11 PM      Failed - HBA1C is between 0 and 7.9 and within 180 days    Hemoglobin A1C  Date Value Ref Range Status  03/08/2017 6.1  Final   HB A1C (BAYER DCA - WAIVED)  Date Value Ref Range Status  05/03/2022 7.9 (H) 4.8 - 5.6 % Final    Comment:             Prediabetes: 5.7 - 6.4          Diabetes: >6.4          Glycemic control for adults with diabetes: <7.0    Hgb A1c MFr Bld  Date Value Ref Range Status  05/16/2023 9.1 (H) 4.8 - 5.6 % Final    Comment:             Prediabetes: 5.7 - 6.4          Diabetes: >6.4          Glycemic control for adults with diabetes: <7.0          Passed - Valid encounter within last 6 months    Recent Outpatient Visits           2 weeks ago Uncontrolled type 1 diabetes mellitus with hyperglycemia, with long-term current use of insulin (HCC)   [place] [facility] [place], Megan P, DO   3 months ago Uncontrolled type 1 diabetes mellitus with hyperglycemia, with long-term current use of insulin (HCC)   [place] [facility] Mecum, Erin E, PA-C   1 year ago Uncontrolled type 1 diabetes mellitus with hyperglycemia, with long-term current use of insulin (HCC)   [place] [facility] [place], [place], DO   1 year ago Chronic traumatic pain   [place] [facility] [place], Megan P, DO   1 year ago Hearing loss of left ear, unspecified hearing loss type   [place] [facility] Dorcas Carrow, DO       Future Appointments             In 2 months Laural Benes, Oralia Rud, DO [place] [facility]

## 2023-06-05 ENCOUNTER — Other Ambulatory Visit: Payer: Self-pay

## 2023-06-05 MED ORDER — FREESTYLE LIBRE 3 READER DEVI: 1.0000 | Freq: Every day | 0 refills | Status: DC

## 2023-06-05 MED ORDER — FREESTYLE LIBRE 3 SENSOR MISC: 1.0000 | 1 refills | Status: DC

## 2023-06-16 ENCOUNTER — Other Ambulatory Visit: Payer: Self-pay | Admitting: Family Medicine

## 2023-06-16 DIAGNOSIS — E1065 Type 1 diabetes mellitus with hyperglycemia: Secondary | ICD-10-CM

## 2023-06-18 ENCOUNTER — Other Ambulatory Visit: Payer: Self-pay

## 2023-06-18 DIAGNOSIS — E1065 Type 1 diabetes mellitus with hyperglycemia: Secondary | ICD-10-CM

## 2023-06-18 NOTE — Telephone Encounter (Signed)
Request too soon last refilled 06/04/23 Requested Prescriptions  Pending Prescriptions Disp Refills   LANTUS SOLOSTAR 100 UNIT/ML Solostar Pen [Pharmacy Med Name: Lantus SoloStar Prefilled Pen 100unit/mL Solution for Injection] 3 mL 0    Sig: Inject 15 units under the skin daily.     Endocrinology:  Diabetes - Insulins Failed - 06/16/2023  9:05 PM      Failed - HBA1C is between 0 and 7.9 and within 180 days    Hemoglobin A1C  Date Value Ref Range Status  03/08/2017 6.1  Final   HB A1C (BAYER DCA - WAIVED)  Date Value Ref Range Status  05/03/2022 7.9 (H) 4.8 - 5.6 % Final    Comment:             Prediabetes: 5.7 - 6.4          Diabetes: >6.4          Glycemic control for adults with diabetes: <7.0    Hgb A1c MFr Bld  Date Value Ref Range Status  05/16/2023 9.1 (H) 4.8 - 5.6 % Final    Comment:             Prediabetes: 5.7 - 6.4          Diabetes: >6.4          Glycemic control for adults with diabetes: <7.0          Passed - Valid encounter within last 6 months    Recent Outpatient Visits           1 month ago Uncontrolled type 1 diabetes mellitus with hyperglycemia, with long-term current use of insulin (HCC)   [place] [facility] [place], Megan P, DO   4 months ago Uncontrolled type 1 diabetes mellitus with hyperglycemia, with long-term current use of insulin (HCC)   [place] [facility] Mecum, Erin E, PA-C   1 year ago Uncontrolled type 1 diabetes mellitus with hyperglycemia, with long-term current use of insulin (HCC)   [place] [facility] [place], Cesar Chavez, DO   1 year ago Chronic traumatic pain   [facility] [place], Megan P, DO   1 year ago Hearing loss of left ear, unspecified hearing loss type   [place] [facility] Dorcas Carrow, DO       Future Appointments             In 1 month Johnson, Oralia Rud, DO  [facility], [facility]

## 2023-06-18 NOTE — Telephone Encounter (Signed)
Per patient's wife, insurance will only cover one pen at a time. Requesting prescription with additional refills.

## 2023-06-20 ENCOUNTER — Telehealth: Payer: Self-pay | Admitting: Family Medicine

## 2023-06-20 NOTE — Telephone Encounter (Signed)
Medication Refill -  Most Recent Primary Care Visit:  Provider: Dorcas Carrow  Department: [facility]  Visit Type: OFFICE VISIT  Date: 05/16/2023  Medication: Dexcom G7  Has the patient contacted their pharmacy? yes ((Agent: If yes, when and what did the pharmacy advise?)pharmacy called directly in  This is the patient's preferred pharmacy: [facility] (New Address) - [place], [place] - [address] AT Previously: [address] Phone: [phone]  Fax: [phone]    Has the prescription been filled recently? no  Is the patient out of the medication? yes  Has the patient been seen for an appointment in the last year OR does the patient have an upcoming appointment? yes  Can we respond through MyChart? yes  Agent: Please be advised that Rx refills may take up to 3 business days. We ask that you follow-up with your pharmacy.

## 2023-06-21 MED ORDER — LANTUS SOLOSTAR 100 UNIT/ML ~~LOC~~ SOPN: PEN_INJECTOR | SUBCUTANEOUS | 0 refills | Status: DC

## 2023-06-24 ENCOUNTER — Other Ambulatory Visit: Payer: Self-pay

## 2023-06-24 DIAGNOSIS — E1065 Type 1 diabetes mellitus with hyperglycemia: Secondary | ICD-10-CM

## 2023-06-24 MED ORDER — LANTUS SOLOSTAR 100 UNIT/ML ~~LOC~~ SOPN: PEN_INJECTOR | SUBCUTANEOUS | 0 refills | Status: DC

## 2023-08-16 ENCOUNTER — Ambulatory Visit: Payer: 59 | Admitting: Family Medicine

## 2023-08-22 ENCOUNTER — Other Ambulatory Visit: Payer: Self-pay | Admitting: Family Medicine

## 2023-08-22 ENCOUNTER — Other Ambulatory Visit: Payer: Self-pay | Admitting: Physician Assistant

## 2023-08-22 DIAGNOSIS — E1065 Type 1 diabetes mellitus with hyperglycemia: Secondary | ICD-10-CM

## 2023-08-22 DIAGNOSIS — K219 Gastro-esophageal reflux disease without esophagitis: Secondary | ICD-10-CM

## 2023-08-26 NOTE — Telephone Encounter (Signed)
 Requested medication (s) are due for refill today: no  Requested medication (s) are on the active medication list: yes  Last refill:  05/22/23 #360 1 RF  Future visit scheduled: yes  Notes to clinic:  abnormal Hgb A1C has a visit this month- did not want to refill due to abn labs   Requested Prescriptions  Pending Prescriptions Disp Refills   metFORMIN  (GLUCOPHAGE ) 500 MG tablet [Pharmacy Med Name: Metformin  Hydrochloride 500mg  Tablet] 360 tablet 0    Sig: Take 2 tablets by mouth twice daily with meals.     Endocrinology:  Diabetes - Biguanides Failed - 08/26/2023 12:28 PM      Failed - HBA1C is between 0 and 7.9 and within 180 days    Hemoglobin A1C  Date Value Ref Range Status  03/08/2017 6.1  Final   HB A1C (BAYER DCA - WAIVED)  Date Value Ref Range Status  05/03/2022 7.9 (H) 4.8 - 5.6 % Final    Comment:             Prediabetes: 5.7 - 6.4          Diabetes: >6.4          Glycemic control for adults with diabetes: <7.0    Hgb A1c MFr Bld  Date Value Ref Range Status  05/16/2023 9.1 (H) 4.8 - 5.6 % Final    Comment:             Prediabetes: 5.7 - 6.4          Diabetes: >6.4          Glycemic control for adults with diabetes: <7.0          Failed - B12 Level in normal range and within 720 days    No results found for: VITAMINB12       Passed - Cr in normal range and within 360 days    Creatinine, Ser  Date Value Ref Range Status  05/16/2023 0.86 0.76 - 1.27 mg/dL Final   Creatinine, Urine  Date Value Ref Range Status  03/27/2015 330 mg/dL Final         Passed - eGFR in normal range and within 360 days    GFR calc Af Amer  Date Value Ref Range Status  08/15/2020 133 >59 mL/min/1.73 Final    Comment:    **In accordance with recommendations from the NKF-ASN Task force,**   [facility] is in the process of updating its eGFR calculation to the   2021 CKD-EPI creatinine equation that estimates kidney function   without a race variable.    GFR calc non Af Amer   Date Value Ref Range Status  08/15/2020 115 >59 mL/min/1.73 Final   eGFR  Date Value Ref Range Status  05/16/2023 113 >59 mL/min/1.73 Final         Passed - Valid encounter within last 6 months    Recent Outpatient Visits           3 months ago Uncontrolled type 1 diabetes mellitus with hyperglycemia, with long-term current use of insulin  (HCC)   [facility], Megan P, DO   6 months ago Uncontrolled type 1 diabetes mellitus with hyperglycemia, with long-term current use of insulin  (HCC)   [place] [facility] Mecum, Erin E, PA-C   1 year ago Uncontrolled type 1 diabetes mellitus with hyperglycemia, with long-term current use of insulin  [facility])    [facility] [place], Megan P, DO   1  year ago Chronic traumatic pain   [place] [facility] [place], [place] P, DO   1 year ago Hearing loss of left ear, unspecified hearing loss type   [place] [facility] [place], [place], DO       Future Appointments             In 1 week Vicci, Duwaine SQUIBB, DO  [facility]            Passed - CBC within normal limits and completed in the last 12 months    WBC  Date Value Ref Range Status  05/16/2023 8.0 3.4 - 10.8 x10E3/uL Final  03/27/2015 17.9 (H) 3.8 - 10.6 K/uL Final   RBC  Date Value Ref Range Status  05/16/2023 4.91 4.14 - 5.80 x10E6/uL Final  03/27/2015 6.53 (H) 4.40 - 5.90 MIL/uL Final   Hemoglobin  Date Value Ref Range Status  05/16/2023 14.4 13.0 - 17.7 g/dL Final   Hematocrit  Date Value Ref Range Status  05/16/2023 42.7 37.5 - 51.0 % Final   MCHC  Date Value Ref Range Status  05/16/2023 33.7 31.5 - 35.7 g/dL Final  91/85/7983 66.5 32.0 - 36.0 g/dL Final   [facility]  Date Value Ref Range Status  05/16/2023 29.3 26.6 - 33.0 pg Final  03/27/2015 27.7 26.0 - 34.0 pg Final   MCV  Date Value Ref Range Status  05/16/2023 87 79 - 97 fL Final   No  results found for: PLTCOUNTKUC, LABPLAT, POCPLA RDW  Date Value Ref Range Status  05/16/2023 13.2 11.6 - 15.4 % Final

## 2023-08-26 NOTE — Telephone Encounter (Signed)
 Requested Prescriptions  Pending Prescriptions Disp Refills   pantoprazole  (PROTONIX ) 40 MG tablet [Pharmacy Med Name: Pantoprazole  Sodium 40mg  Delayed-Release Tablet] 90 tablet 0    Sig: Take 1 tablet by mouth daily.     Gastroenterology: Proton Pump Inhibitors Passed - 08/26/2023 11:49 AM      Passed - Valid encounter within last 12 months    Recent Outpatient Visits           3 months ago Uncontrolled type 1 diabetes mellitus with hyperglycemia, with long-term current use of insulin  (HCC)   [place] [facility], Megan P, DO   6 months ago Uncontrolled type 1 diabetes mellitus with hyperglycemia, with long-term current use of insulin  (HCC)   [place] [facility] Mecum, Erin E, PA-C   1 year ago Uncontrolled type 1 diabetes mellitus with hyperglycemia, with long-term current use of insulin  [facility])   [place] [facility] [place], Megan P, DO   1 year ago Chronic traumatic pain   [place] [facility] [place], Megan P, DO   1 year ago Hearing loss of left ear, unspecified hearing loss type   [place] [facility] Vicci Duwaine SQUIBB, DO       Future Appointments             In 1 week Vicci, Duwaine SQUIBB, DO [place] [facility], [facility]

## 2023-09-06 ENCOUNTER — Ambulatory Visit: Payer: 59 | Admitting: Family Medicine

## 2023-09-06 ENCOUNTER — Encounter: Payer: Self-pay | Admitting: Family Medicine

## 2023-09-06 VITALS — BP 112/77 | HR 80 | Temp 98.2°F | Ht 67.0 in | Wt 176.8 lb

## 2023-09-06 DIAGNOSIS — E1065 Type 1 diabetes mellitus with hyperglycemia: Secondary | ICD-10-CM

## 2023-09-06 DIAGNOSIS — K219 Gastro-esophageal reflux disease without esophagitis: Secondary | ICD-10-CM | POA: Diagnosis not present

## 2023-09-06 LAB — BAYER DCA HB A1C WAIVED: HB A1C (BAYER DCA - WAIVED): 9.8 % — ABNORMAL HIGH (ref 4.8–5.6)

## 2023-09-06 MED ORDER — PANTOPRAZOLE SODIUM 40 MG PO TBEC: 40.0000 mg | DELAYED_RELEASE_TABLET | Freq: Every day | ORAL | 1 refills | Status: DC

## 2023-09-06 MED ORDER — BUSPIRONE HCL 5 MG PO TABS: 5.0000 mg | ORAL_TABLET | Freq: Two times a day (BID) | ORAL | 1 refills | Status: DC

## 2023-09-06 MED ORDER — LANTUS SOLOSTAR 100 UNIT/ML ~~LOC~~ SOPN: PEN_INJECTOR | SUBCUTANEOUS | 0 refills | Status: DC

## 2023-09-06 NOTE — Assessment & Plan Note (Signed)
Not doing well with A1c of 9.8. Will check labs to confirm type 1 or type 2- if type 2 will start rybelsus. Recheck 3 months. Call with any concerns.

## 2023-09-06 NOTE — Progress Notes (Signed)
BP 112/77   Pulse 80   Temp 98.2 F (36.8 C) (Oral)   Ht 5\' 7"  (1.702 m)   Wt 176 lb 12.8 oz (80.2 kg)   SpO2 95%   BMI 27.69 kg/m    Subjective:    Patient ID: Alexis [name], male    DOB: September 05, 1985, 38 y.o.   MRN: [id number]  HPI: [name] is a 38 y.o. male  Chief Complaint  Patient presents with   Diabetes    Patient declines having a recent Diabetic Eye Exam.    DIABETES Hypoglycemic episodes:yes- 48 Polydipsia/polyuria: yes Visual disturbance: yes Chest pain: no Paresthesias: no Glucose Monitoring: no Taking Insulin?: yes  Long acting insulin: 20 units Blood Pressure Monitoring: not checking Retinal Examination: Not up to Date Foot Exam: Up to Date Diabetic Education: Completed Pneumovax: Up to Date Influenza: Not up to Date Aspirin: no  Relevant past medical, surgical, family and social history reviewed and updated as indicated. Interim medical history since our last visit reviewed. Allergies and medications reviewed and updated.  Review of Systems  Constitutional: Negative.   Respiratory: Negative.    Cardiovascular: Negative.   Musculoskeletal: Negative.   Psychiatric/Behavioral: Negative.      Per HPI unless specifically indicated above     Objective:    BP 112/77   Pulse 80   Temp 98.2 F (36.8 C) (Oral)   Ht 5\' 7"  (1.702 m)   Wt 176 lb 12.8 oz (80.2 kg)   SpO2 95%   BMI 27.69 kg/m   Wt Readings from Last 3 Encounters:  09/06/23 176 lb 12.8 oz (80.2 kg)  05/16/23 173 lb (78.5 kg)  02/18/23 178 lb 9.6 oz (81 kg)    Physical Exam Vitals and nursing note reviewed.  Constitutional:      General: He is not in acute distress.    Appearance: Normal appearance. He is not ill-appearing, toxic-appearing or diaphoretic.  HENT:     Head: Normocephalic and atraumatic.     Right Ear: External ear normal.     Left Ear: External ear normal.     Nose: Nose normal.     Mouth/Throat:     Mouth: Mucous membranes are moist.      Pharynx: Oropharynx is clear.  Eyes:     General: No scleral icterus.       Right eye: No discharge.        Left eye: No discharge.     Extraocular Movements: Extraocular movements intact.     Conjunctiva/sclera: Conjunctivae normal.     Pupils: Pupils are equal, round, and reactive to light.  Cardiovascular:     Rate and Rhythm: Normal rate and regular rhythm.     Pulses: Normal pulses.     Heart sounds: Normal heart sounds. No murmur heard.    No friction rub. No gallop.  Pulmonary:     Effort: Pulmonary effort is normal. No respiratory distress.     Breath sounds: Normal breath sounds. No stridor. No wheezing, rhonchi or rales.  Chest:     Chest wall: No tenderness.  Musculoskeletal:        General: Normal range of motion.     Cervical back: Normal range of motion and neck supple.  Skin:    General: Skin is warm and dry.     Capillary Refill: Capillary refill takes less than 2 seconds.     Coloration: Skin is not jaundiced or pale.     Findings: No  bruising, erythema, lesion or rash.  Neurological:     General: No focal deficit present.     Mental Status: He is alert and oriented to person, place, and time. Mental status is at baseline.  Psychiatric:        Mood and Affect: Mood normal.        Behavior: Behavior normal.        Thought Content: Thought content normal.        Judgment: Judgment normal.     Results for orders placed or performed in visit on 05/16/23  Microscopic Examination   Collection Time: 05/16/23  4:05 PM   Urine  Result Value Ref Range   WBC, UA None seen 0 - 5 /hpf   RBC, Urine 0-2 0 - 2 /hpf   Epithelial Cells (non renal) None seen 0 - 10 /hpf   Bacteria, UA None seen None seen/Few  Urinalysis, Routine w reflex microscopic   Collection Time: 05/16/23  4:05 PM  Result Value Ref Range   Specific Gravity, UA >1.030 (H) 1.005 - 1.030   pH, UA 6.5 5.0 - 7.5   Color, UA Yellow Yellow   Appearance Ur Clear Clear   Leukocytes,UA Negative  Negative   Protein,UA 2+ (A) Negative/Trace   Glucose, UA Negative Negative   Ketones, UA Negative Negative   RBC, UA Negative Negative   Bilirubin, UA Negative Negative   Urobilinogen, Ur 0.2 0.2 - 1.0 mg/dL   Nitrite, UA Negative Negative   Microscopic Examination See below:   Microalbumin, Urine Waived   Collection Time: 05/16/23  4:05 PM  Result Value Ref Range   Microalb, Ur Waived 150 (H) 0 - 19 mg/L   Creatinine, Urine Waived 300 10 - 300 mg/dL   Microalb/Creat Ratio 30-300 (H) <30 mg/g  Comprehensive metabolic panel   Collection Time: 05/16/23  4:06 PM  Result Value Ref Range   Glucose 103 (H) 70 - 99 mg/dL   BUN 21 (H) 6 - 20 mg/dL   Creatinine, Ser 1.61 0.76 - 1.27 mg/dL   eGFR 096 >04 VW/UJW/1.19   BUN/Creatinine Ratio 24 (H) 9 - 20   Sodium 139 134 - 144 mmol/L   Potassium 4.0 3.5 - 5.2 mmol/L   Chloride 102 96 - 106 mmol/L   CO2 25 20 - 29 mmol/L   Calcium 9.5 8.7 - 10.2 mg/dL   Total Protein 6.6 6.0 - 8.5 g/dL   Albumin 4.7 4.1 - 5.1 g/dL   Globulin, Total 1.9 1.5 - 4.5 g/dL   Bilirubin Total 0.2 0.0 - 1.2 mg/dL   Alkaline Phosphatase 84 44 - 121 IU/L   AST 24 0 - 40 IU/L   ALT <5 0 - 44 IU/L  CBC with Differential/Platelet   Collection Time: 05/16/23  4:06 PM  Result Value Ref Range   WBC 8.0 3.4 - 10.8 x10E3/uL   RBC 4.91 4.14 - 5.80 x10E6/uL   Hemoglobin 14.4 13.0 - 17.7 g/dL   Hematocrit 14.7 82.9 - 51.0 %   MCV 87 79 - 97 fL   MCH 29.3 26.6 - 33.0 pg   MCHC 33.7 31.5 - 35.7 g/dL   RDW 56.2 13.0 - 86.5 %   Platelets 287 150 - 450 x10E3/uL   Neutrophils 58 Not Estab. %   Lymphs 31 Not Estab. %   Monocytes 8 Not Estab. %   Eos 2 Not Estab. %   Basos 1 Not Estab. %   Neutrophils Absolute 4.6 1.4 - 7.0 x10E3/uL  Lymphocytes Absolute 2.5 0.7 - 3.1 x10E3/uL   Monocytes Absolute 0.6 0.1 - 0.9 x10E3/uL   EOS (ABSOLUTE) 0.2 0.0 - 0.4 x10E3/uL   Basophils Absolute 0.1 0.0 - 0.2 x10E3/uL   Immature Granulocytes 0 Not Estab. %   Immature Grans (Abs)  0.0 0.0 - 0.1 x10E3/uL  Lipid Panel w/o Chol/HDL Ratio   Collection Time: 05/16/23  4:06 PM  Result Value Ref Range   Cholesterol, Total 130 100 - 199 mg/dL   Triglycerides 132 0 - 149 mg/dL   HDL 52 >44 mg/dL   VLDL Cholesterol Cal 21 5 - 40 mg/dL   LDL Chol Calc (NIH) 57 0 - 99 mg/dL  TSH   Collection Time: 05/16/23  4:06 PM  Result Value Ref Range   TSH 0.586 0.450 - 4.500 uIU/mL  Hgb A1c w/o eAG   Collection Time: 05/16/23  4:06 PM  Result Value Ref Range   Hgb A1c MFr Bld 9.1 (H) 4.8 - 5.6 %      Assessment & Plan:   Problem List Items Addressed This Visit       Digestive   Gastroesophageal reflux disease   Relevant Medications   pantoprazole (PROTONIX) 40 MG tablet     Endocrine   Uncontrolled type 1 diabetes mellitus with hyperglycemia, with long-term current use of insulin (HCC) - Primary   Not doing well with A1c of 9.8. Will check labs to confirm type 1 or type 2- if type 2 will start rybelsus. Recheck 3 months. Call with any concerns.       Relevant Medications   insulin glargine (LANTUS SOLOSTAR) 100 UNIT/ML Solostar Pen   Other Relevant Orders   Bayer DCA Hb A1c Waived   C-peptide   Glutamic acid decarboxylase auto abs     Follow up plan: Return in about 3 months (around 12/05/2023).

## 2023-09-10 LAB — C-PEPTIDE: C-Peptide: 2 ng/mL (ref 1.1–4.4)

## 2023-09-10 LAB — GLUTAMIC ACID DECARBOXYLASE AUTO ABS: Glutamic Acid Decarb Ab: 2329.1 U/mL — ABNORMAL HIGH (ref 0.0–5.0)

## 2023-09-22 ENCOUNTER — Other Ambulatory Visit: Payer: Self-pay | Admitting: Family Medicine

## 2023-09-22 ENCOUNTER — Encounter: Payer: Self-pay | Admitting: Family Medicine

## 2023-09-22 DIAGNOSIS — E1065 Type 1 diabetes mellitus with hyperglycemia: Secondary | ICD-10-CM

## 2023-10-16 ENCOUNTER — Other Ambulatory Visit: Payer: Self-pay | Admitting: Family Medicine

## 2023-10-16 DIAGNOSIS — N529 Male erectile dysfunction, unspecified: Secondary | ICD-10-CM

## 2023-10-17 NOTE — Telephone Encounter (Signed)
 Requested Prescriptions  Pending Prescriptions Disp Refills   sildenafil (VIAGRA) 100 MG tablet [Pharmacy Med Name: Sildenafil Citrate 100mg  Tablet] 90 tablet 0    Sig: Take 1/2 to 1 tablet by mouth daily as needed for erectile dysfunction.     Urology: Erectile Dysfunction Agents Passed - 10/17/2023  8:49 AM      Passed - AST in normal range and within 360 days    AST  Date Value Ref Range Status  05/16/2023 24 0 - 40 IU/L Final         Passed - ALT in normal range and within 360 days    ALT  Date Value Ref Range Status  05/16/2023 <5 0 - 44 IU/L Final    Comment:    **Verified by repeat analysis**         Passed - Last BP in normal range    BP Readings from Last 1 Encounters:  09/06/23 112/77         Passed - Valid encounter within last 12 months    Recent Outpatient Visits           1 month ago Uncontrolled type 1 diabetes mellitus with hyperglycemia, with long-term current use of insulin (HCC)   [place] [facility] [place], Megan P, DO   5 months ago Uncontrolled type 1 diabetes mellitus with hyperglycemia, with long-term current use of insulin (HCC)   [place] [facility], Megan P, DO   8 months ago Uncontrolled type 1 diabetes mellitus with hyperglycemia, with long-term current use of insulin (HCC)   [place] [facility] Mecum, Erin E, PA-C   1 year ago Uncontrolled type 1 diabetes mellitus with hyperglycemia, with long-term current use of insulin (HCC)   [place] [facility] [place], Damar, DO   1 year ago Chronic traumatic pain   [facility] Dorcas Carrow, DO       Future Appointments             In 1 month Johnson, Oralia Rud, DO [place] [facility], [facility]

## 2023-10-23 ENCOUNTER — Other Ambulatory Visit: Payer: Self-pay | Admitting: Family Medicine

## 2023-10-23 DIAGNOSIS — E1065 Type 1 diabetes mellitus with hyperglycemia: Secondary | ICD-10-CM

## 2023-11-04 ENCOUNTER — Other Ambulatory Visit: Payer: Self-pay | Admitting: Family Medicine

## 2023-11-04 DIAGNOSIS — E1065 Type 1 diabetes mellitus with hyperglycemia: Secondary | ICD-10-CM

## 2023-11-06 NOTE — Telephone Encounter (Signed)
 Requested Prescriptions  Pending Prescriptions Disp Refills   LANTUS SOLOSTAR 100 UNIT/ML Solostar Pen [Pharmacy Med Name: Lantus SoloStar Prefilled Pen 100unit/mL Solution for Injection] 15 mL 0    Sig: Inject 20 units under the skin daily.     Endocrinology:  Diabetes - Insulins Failed - 11/06/2023 11:19 AM      Failed - HBA1C is between 0 and 7.9 and within 180 days    Hemoglobin A1C  Date Value Ref Range Status  03/08/2017 6.1  Final   HB A1C (BAYER DCA - WAIVED)  Date Value Ref Range Status  09/06/2023 9.8 (H) 4.8 - 5.6 % Final    Comment:             Prediabetes: 5.7 - 6.4          Diabetes: >6.4          Glycemic control for adults with diabetes: <7.0          Passed - Valid encounter within last 6 months    Recent Outpatient Visits           2 months ago Uncontrolled type 1 diabetes mellitus with hyperglycemia, with long-term current use of insulin (HCC)   [place] [facility] [place], Megan P, DO   5 months ago Uncontrolled type 1 diabetes mellitus with hyperglycemia, with long-term current use of insulin (HCC)   [place] [facility], Megan P, DO   8 months ago Uncontrolled type 1 diabetes mellitus with hyperglycemia, with long-term current use of insulin (HCC)   [place] [facility] Mecum, Erin E, PA-C   1 year ago Uncontrolled type 1 diabetes mellitus with hyperglycemia, with long-term current use of insulin (HCC)   [place] [facility] [place], [place], DO   1 year ago Chronic traumatic pain   [place] [facility] Dorcas Carrow, DO       Future Appointments             In 1 month Johnson, Oralia Rud, DO  [facility], [facility]

## 2023-11-30 ENCOUNTER — Other Ambulatory Visit: Payer: Self-pay | Admitting: Family Medicine

## 2023-11-30 DIAGNOSIS — E1065 Type 1 diabetes mellitus with hyperglycemia: Secondary | ICD-10-CM

## 2023-12-02 NOTE — Telephone Encounter (Signed)
 Requested Prescriptions  Pending Prescriptions Disp Refills   metFORMIN  (GLUCOPHAGE ) 500 MG tablet [Pharmacy Med Name: Metformin  Hydrochloride 500mg  Tablet] 360 tablet 0    Sig: Take 2 tablets by mouth twice daily with meals.     Endocrinology:  Diabetes - Biguanides Failed - 12/02/2023  1:45 PM      Failed - HBA1C is between 0 and 7.9 and within 180 days    Hemoglobin A1C  Date Value Ref Range Status  03/08/2017 6.1  Final   HB A1C (BAYER DCA - WAIVED)  Date Value Ref Range Status  09/06/2023 9.8 (H) 4.8 - 5.6 % Final    Comment:             Prediabetes: 5.7 - 6.4          Diabetes: >6.4          Glycemic control for adults with diabetes: <7.0          Failed - B12 Level in normal range and within 720 days    No results found for: "VITAMINB12"       Failed - Valid encounter within last 6 months    Recent Outpatient Visits   None     Future Appointments             In 1 week Johnson, Megan P, DO [place] [facility], [facility]            Passed - Cr in normal range and within 360 days    Creatinine, Ser  Date Value Ref Range Status  05/16/2023 0.86 0.76 - 1.27 mg/dL Final   Creatinine, Urine  Date Value Ref Range Status  03/27/2015 330 mg/dL Final         Passed - eGFR in normal range and within 360 days    GFR calc Af Amer  Date Value Ref Range Status  08/15/2020 133 >59 mL/min/1.73 Final    Comment:    **In accordance with recommendations from the NKF-ASN Task force,**   [facility] is in the process of updating its eGFR calculation to the   2021 CKD-EPI creatinine equation that estimates kidney function   without a race variable.    GFR calc non Af Amer  Date Value Ref Range Status  08/15/2020 115 >59 mL/min/1.73 Final   eGFR  Date Value Ref Range Status  05/16/2023 113 >59 mL/min/1.73 Final         Passed - CBC within normal limits and completed in the last 12 months    WBC  Date Value Ref Range Status  05/16/2023 8.0 3.4 - 10.8  x10E3/uL Final  03/27/2015 17.9 (H) 3.8 - 10.6 K/uL Final   RBC  Date Value Ref Range Status  05/16/2023 4.91 4.14 - 5.80 x10E6/uL Final  03/27/2015 6.53 (H) 4.40 - 5.90 MIL/uL Final   Hemoglobin  Date Value Ref Range Status  05/16/2023 14.4 13.0 - 17.7 g/dL Final   Hematocrit  Date Value Ref Range Status  05/16/2023 42.7 37.5 - 51.0 % Final   MCHC  Date Value Ref Range Status  05/16/2023 33.7 31.5 - 35.7 g/dL Final  62/13/0865 78.4 32.0 - 36.0 g/dL Final   [facility]  Date Value Ref Range Status  05/16/2023 29.3 26.6 - 33.0 pg Final  03/27/2015 27.7 26.0 - 34.0 pg Final   MCV  Date Value Ref Range Status  05/16/2023 87 79 - 97 fL Final   No results found for: "PLTCOUNTKUC", "LABPLAT", "POCPLA" RDW  Date Value Ref  Range Status  05/16/2023 13.2 11.6 - 15.4 % Final

## 2023-12-11 ENCOUNTER — Encounter: Payer: Self-pay | Admitting: Family Medicine

## 2023-12-11 ENCOUNTER — Ambulatory Visit: Admitting: Family Medicine

## 2023-12-11 VITALS — BP 113/76 | HR 96 | Ht 67.0 in | Wt 174.4 lb

## 2023-12-11 DIAGNOSIS — K219 Gastro-esophageal reflux disease without esophagitis: Secondary | ICD-10-CM | POA: Diagnosis not present

## 2023-12-11 DIAGNOSIS — H9192 Unspecified hearing loss, left ear: Secondary | ICD-10-CM | POA: Diagnosis not present

## 2023-12-11 DIAGNOSIS — E1065 Type 1 diabetes mellitus with hyperglycemia: Secondary | ICD-10-CM | POA: Diagnosis not present

## 2023-12-11 DIAGNOSIS — N529 Male erectile dysfunction, unspecified: Secondary | ICD-10-CM | POA: Diagnosis not present

## 2023-12-11 DIAGNOSIS — Z113 Encounter for screening for infections with a predominantly sexual mode of transmission: Secondary | ICD-10-CM

## 2023-12-11 DIAGNOSIS — Z0000 Encounter for general adult medical examination without abnormal findings: Secondary | ICD-10-CM

## 2023-12-11 LAB — MICROALBUMIN, URINE WAIVED
Creatinine, Urine Waived: 200 mg/dL (ref 10–300)
Microalb, Ur Waived: 80 mg/L — ABNORMAL HIGH (ref 0–19)
Microalb/Creat Ratio: <30 mg/g (ref <30)

## 2023-12-11 LAB — BAYER DCA HB A1C WAIVED: HB A1C (BAYER DCA - WAIVED): 9.9 % — ABNORMAL HIGH (ref 4.8–5.6)

## 2023-12-11 MED ORDER — FREESTYLE LIBRE 3 READER DEVI: 1.0000 | Freq: Every day | 0 refills | Status: DC

## 2023-12-11 MED ORDER — BUSPIRONE HCL 5 MG PO TABS: 5.0000 mg | ORAL_TABLET | Freq: Two times a day (BID) | ORAL | 1 refills | Status: DC

## 2023-12-11 MED ORDER — METFORMIN HCL 500 MG PO TABS: 1000.0000 mg | ORAL_TABLET | Freq: Two times a day (BID) | ORAL | 0 refills | Status: DC

## 2023-12-11 MED ORDER — ATORVASTATIN CALCIUM 40 MG PO TABS: 40.0000 mg | ORAL_TABLET | ORAL | 0 refills | Status: DC

## 2023-12-11 MED ORDER — PEN NEEDLES 30G X 5 MM MISC: 1.0000 | Freq: Two times a day (BID) | 6 refills | Status: DC

## 2023-12-11 MED ORDER — PANTOPRAZOLE SODIUM 40 MG PO TBEC: 40.0000 mg | DELAYED_RELEASE_TABLET | Freq: Every day | ORAL | 1 refills | Status: DC

## 2023-12-11 MED ORDER — FREESTYLE LIBRE 3 SENSOR MISC: 1.0000 | 1 refills | Status: DC

## 2023-12-11 MED ORDER — SILDENAFIL CITRATE 100 MG PO TABS: 100.0000 mg | ORAL_TABLET | ORAL | 1 refills | Status: DC | PRN

## 2023-12-11 MED ORDER — LANTUS SOLOSTAR 100 UNIT/ML ~~LOC~~ SOPN: PEN_INJECTOR | SUBCUTANEOUS | 0 refills | Status: DC

## 2023-12-11 NOTE — Progress Notes (Signed)
 BP 113/76 (BP Location: Left Arm, Patient Position: Sitting, Cuff Size: Normal)   Pulse 96   Ht 5\' 7"  (1.702 m)   Wt 174 lb 6.4 oz (79.1 kg)   SpO2 96%   BMI 27.31 kg/m    Subjective:    Patient ID: Alexis [name], male    DOB: January 14, 1986, 38 y.o.   MRN: [id number]  HPI: Alexis [name] is a 38 y.o. male presenting on 12/11/2023 for comprehensive medical examination. Current medical complaints include:  DIABETES Hypoglycemic episodes:yes Polydipsia/polyuria: yes Visual disturbance: yes Chest pain: no Paresthesias: no Glucose Monitoring: no  Accucheck frequency: Not Checking Taking Insulin ?: yes Blood Pressure Monitoring: not checking Retinal Examination: Not up to Date Foot Exam: Up to Date Diabetic Education: Completed Pneumovax: Up to Date Influenza: Not up to Date Aspirin: no  HYPERTENSION / HYPERLIPIDEMIA Satisfied with current treatment? yes Duration of hypertension: N/A BP monitoring frequency: not checking BP medication side effects: no Past BP meds: none Duration of hyperlipidemia: chronic Cholesterol medication side effects: no Cholesterol supplements: none Past cholesterol medications: atorvastatin  Medication compliance: poor compliance Aspirin: no Recent stressors: no Recurrent headaches: no Visual changes: no Palpitations: no Dyspnea: no Chest pain: no Lower extremity edema: no Dizzy/lightheaded: no  He currently lives with: wife and daughters Interim Problems from his last visit: no  Depression Screen done today and results listed below:     12/11/2023    2:29 PM 09/06/2023    2:51 PM 05/16/2023    4:03 PM 05/03/2022    4:14 PM 03/30/2022    2:07 PM  Depression screen PHQ 2/9  Decreased Interest 0 0 0 1 0  Down, Depressed, Hopeless 0 0 0 0 0  PHQ - 2 Score 0 0 0 1 0  Altered sleeping 0 0  1 0  Tired, decreased energy 0 0  0 0  Change in appetite 0 0  0 0  Feeling bad or failure about yourself  0 0  0 0  Trouble  concentrating 0 0  0 0  Moving slowly or fidgety/restless 0 0  0 0  Suicidal thoughts 0 0  0 0  PHQ-9 Score 0 0  2 0  Difficult doing work/chores Not difficult at all Not difficult at all  Somewhat difficult     Past Medical History:  Past Medical History:  Diagnosis Date   Acute renal failure (HCC)    Diabetes type 2, uncontrolled 01/20/2016   Fracture of multiple pubic rami, right, closed, initial encounter (HCC) 05/24/2021   Subarachnoid hemorrhage following injury, no loss of consciousness (HCC) 04/17/2021    Surgical History:  Past Surgical History:  Procedure Laterality Date   TONSILLECTOMY     TYMPANOSTOMY TUBE PLACEMENT      Medications:  No current outpatient medications on file prior to visit.   No current facility-administered medications on file prior to visit.    Allergies:  No Known Allergies  Social History:  Social History   Socioeconomic History   Marital status: Single    Spouse name: Not on file   Number of children: Not on file   Years of education: Not on file   Highest education level: Not on file  Occupational History   Not on file  Tobacco Use   Smoking status: Every Day    Current packs/day: 1.00    Types: Cigarettes    Passive exposure: Current   Smokeless tobacco: Never  Vaping Use   Vaping status: Never Used  Substance and Sexual Activity   Alcohol use: No   Drug use: Yes    Types: Marijuana   Sexual activity: Yes    Birth control/protection: None  Other Topics Concern   Not on file  Social History Narrative   Not on file   Social Drivers of Health   Financial Resource Strain: Medium Risk (01/05/2020)   Overall Financial Resource Strain (CARDIA)    Difficulty of Paying Living Expenses: Somewhat hard  Food Insecurity: Not on file  Transportation Needs: No Transportation Needs (04/17/2021)   Received from [facility], [facility]   PRAPARE - Transportation    In the past 12 months, has  lack of transportation kept you from medical appointments or from getting medications?: No    Lack of Transportation (Non-Medical): No  Physical Activity: Not on file  Stress: Not on file  Social Connections: Not on file  Intimate Partner Violence: Not on file   Social History   Tobacco Use  Smoking Status Every Day   Current packs/day: 1.00   Types: Cigarettes   Passive exposure: Current  Smokeless Tobacco Never   Social History   Substance and Sexual Activity  Alcohol Use No    Family History:  Family History  Problem Relation Age of Onset   Hyperlipidemia Mother    Hypertension Mother    Diabetes Father    Diabetes Maternal Grandmother        Lung    Past medical history, surgical history, medications, allergies, family history and social history reviewed with patient today and changes made to appropriate areas of the chart.   Review of Systems  Constitutional:  Positive for diaphoresis. Negative for chills, fever, malaise/fatigue and weight loss.  HENT:  Positive for hearing loss. Negative for congestion, ear discharge, ear pain, nosebleeds, sinus pain, sore throat and tinnitus.   Eyes: Negative.   Respiratory: Negative.  Negative for stridor.   Cardiovascular: Negative.   Gastrointestinal:  Positive for heartburn. Negative for abdominal pain, blood in stool, constipation, diarrhea, melena, nausea and vomiting.  Genitourinary: Negative.   Musculoskeletal: Negative.   Skin: Negative.   Neurological: Negative.   Endo/Heme/Allergies:  Positive for polydipsia. Negative for environmental allergies. Does not bruise/bleed easily.  Psychiatric/Behavioral: Negative.     All other ROS negative except what is listed above and in the HPI.      Objective:    BP 113/76 (BP Location: Left Arm, Patient Position: Sitting, Cuff Size: Normal)   Pulse 96   Ht 5\' 7"  (1.702 m)   Wt 174 lb 6.4 oz (79.1 kg)   SpO2 96%   BMI 27.31 kg/m   Wt Readings from Last 3 Encounters:   12/11/23 174 lb 6.4 oz (79.1 kg)  09/06/23 176 lb 12.8 oz (80.2 kg)  05/16/23 173 lb (78.5 kg)    Physical Exam Vitals and nursing note reviewed.  Constitutional:      General: He is not in acute distress.    Appearance: Normal appearance. He is not ill-appearing, toxic-appearing or diaphoretic.  HENT:     Head: Normocephalic and atraumatic.     Right Ear: Tympanic membrane, ear canal and external ear normal. There is no impacted cerumen.     Left Ear: Tympanic membrane, ear canal and external ear normal. There is no impacted cerumen.     Nose: Nose normal. No congestion or rhinorrhea.     Mouth/Throat:     Mouth: Mucous membranes are moist.  Pharynx: Oropharynx is clear. No oropharyngeal exudate or posterior oropharyngeal erythema.  Eyes:     General: No scleral icterus.       Right eye: No discharge.        Left eye: No discharge.     Extraocular Movements: Extraocular movements intact.     Conjunctiva/sclera: Conjunctivae normal.     Pupils: Pupils are equal, round, and reactive to light.  Neck:     Vascular: No carotid bruit.  Cardiovascular:     Rate and Rhythm: Normal rate and regular rhythm.     Pulses: Normal pulses.     Heart sounds: No murmur heard.    No friction rub. No gallop.  Pulmonary:     Effort: Pulmonary effort is normal. No respiratory distress.     Breath sounds: Normal breath sounds. No stridor. No wheezing, rhonchi or rales.  Chest:     Chest wall: No tenderness.  Abdominal:     General: Abdomen is flat. Bowel sounds are normal. There is no distension.     Palpations: Abdomen is soft. There is no mass.     Tenderness: There is no abdominal tenderness. There is no right CVA tenderness, left CVA tenderness, guarding or rebound.     Hernia: No hernia is present.  Genitourinary:    Comments: Genital exam deferred with shared decision making Musculoskeletal:        General: No swelling, tenderness, deformity or signs of injury.     Cervical back:  Normal range of motion and neck supple. No rigidity. No muscular tenderness.     Right lower leg: No edema.     Left lower leg: No edema.  Lymphadenopathy:     Cervical: No cervical adenopathy.  Skin:    General: Skin is warm and dry.     Capillary Refill: Capillary refill takes less than 2 seconds.     Coloration: Skin is not jaundiced or pale.     Findings: No bruising, erythema, lesion or rash.  Neurological:     General: No focal deficit present.     Mental Status: He is alert and oriented to person, place, and time.     Cranial Nerves: No cranial nerve deficit.     Sensory: No sensory deficit.     Motor: No weakness.     Coordination: Coordination normal.     Gait: Gait normal.     Deep Tendon Reflexes: Reflexes normal.  Psychiatric:        Mood and Affect: Mood normal.        Behavior: Behavior normal.        Thought Content: Thought content normal.        Judgment: Judgment normal.     Results for orders placed or performed in visit on 09/06/23  Bayer DCA Hb A1c Waived   Collection Time: 09/06/23  2:56 PM  Result Value Ref Range   HB A1C (BAYER DCA - WAIVED) 9.8 (H) 4.8 - 5.6 %  C-peptide   Collection Time: 09/06/23  2:58 PM  Result Value Ref Range   C-Peptide 2.0 1.1 - 4.4 ng/mL  Glutamic acid decarboxylase auto abs   Collection Time: 09/06/23  2:58 PM  Result Value Ref Range   Glutamic Acid Decarb Ab 2,329.1 (H) 0.0 - 5.0 U/mL      Assessment & Plan:   Problem List Items Addressed This Visit       Digestive   Gastroesophageal reflux disease   Under good control on current  regimen. Continue current regimen. Continue to monitor. Call with any concerns. Refills given.        Relevant Medications   pantoprazole  (PROTONIX ) 40 MG tablet     Endocrine   Uncontrolled type 1 diabetes mellitus with hyperglycemia, with long-term current use of insulin  (HCC)   Not doing well with A1c of 9.9. antibodies positive for type 1- will get pharmacy involved to help him  get on CGM- when testing will check in in about 3 weeks and adjust insulin  as needed. Call with any concerns.       Relevant Medications   atorvastatin  (LIPITOR) 40 MG tablet   Insulin  Pen Needle (PEN NEEDLES) 30G X 5 MM MISC   insulin  glargine (LANTUS  SOLOSTAR) 100 UNIT/ML Solostar Pen   metFORMIN  (GLUCOPHAGE ) 500 MG tablet   Other Relevant Orders   Comprehensive metabolic panel with GFR   CBC with Differential/Platelet   Lipid Panel w/o Chol/HDL Ratio   TSH   Microalbumin, Urine Waived   Bayer DCA Hb A1c Waived   AMB Referral VBCI Care Management     Nervous and Auditory   Hearing loss of left ear   Referral to audiology placed today. Await their input.       Relevant Orders   Ambulatory referral to Audiology     Other   Erectile dysfunction   Referral to urology placed today.       Relevant Medications   sildenafil  (VIAGRA ) 100 MG tablet   Other Relevant Orders   Ambulatory referral to Urology   Other Visit Diagnoses       Routine general medical examination at a health care facility    -  Primary   Vaccines up to date/declined. Screening labs checked today. Continue diet and exercise. Call with any concerns. Continue to monitor.     Screening examination for STI       Labs drawn today. Await results.   Relevant Orders   HSV 1 and 2 Ab, IgG   GC/Chlamydia Probe Amp   HIV Antibody (routine testing w rflx)   RPR w/reflex to TrepSure   Acute Viral Hepatitis (HAV, HBV, HCV)       LABORATORY TESTING:  Health maintenance labs ordered today as discussed above.   IMMUNIZATIONS:   - Tdap: Tetanus vaccination status reviewed: last tetanus booster within 10 years. - Influenza: Postponed to flu season - Pneumovax: Up to date - Prevnar: Up to date - COVID: Refused - HPV: Not applicable - Shingrix vaccine: Not applicable  PATIENT COUNSELING:    Sexuality: Discussed sexually transmitted diseases, partner selection, use of condoms, avoidance of unintended pregnancy   and contraceptive alternatives.   Advised to avoid cigarette smoking.  I discussed with the patient that most people either abstain from alcohol or drink within safe limits (<=14/week and <=4 drinks/occasion for males, <=7/weeks and <= 3 drinks/occasion for females) and that the risk for alcohol disorders and other health effects rises proportionally with the number of drinks per week and how often a drinker exceeds daily limits.  Discussed cessation/primary prevention of drug use and availability of treatment for abuse.   Diet: Encouraged to adjust caloric intake to maintain  or achieve ideal body weight, to reduce intake of dietary saturated fat and total fat, to limit sodium intake by avoiding high sodium foods and not adding table salt, and to maintain adequate dietary potassium and calcium  preferably from fresh fruits, vegetables, and low-fat dairy products.    stressed the importance of regular exercise  Injury prevention: Discussed safety belts, safety helmets, smoke detector, smoking near bedding or upholstery.   Dental health: Discussed importance of regular tooth brushing, flossing, and dental visits.   Follow up plan: NEXT PREVENTATIVE PHYSICAL DUE IN 1 YEAR. Return in about 3 weeks (around 01/01/2024) for virtual OK.

## 2023-12-11 NOTE — Assessment & Plan Note (Signed)
Referral to urology placed today

## 2023-12-11 NOTE — Assessment & Plan Note (Signed)
 Under good control on current regimen. Continue current regimen. Continue to monitor. Call with any concerns. Refills given.

## 2023-12-11 NOTE — Assessment & Plan Note (Signed)
 Referral to audiology placed today. Await their input.

## 2023-12-11 NOTE — Assessment & Plan Note (Signed)
 Not doing well with A1c of 9.9. antibodies positive for type 1- will get pharmacy involved to help him get on CGM- when testing will check in in about 3 weeks and adjust insulin  as needed. Call with any concerns.

## 2023-12-12 LAB — CBC WITH DIFFERENTIAL/PLATELET
Basophils Absolute: 0 10*3/uL (ref 0.0–0.2)
Basos: 1 %
EOS (ABSOLUTE): 0.1 10*3/uL (ref 0.0–0.4)
Eos: 1 %
Hematocrit: 43.7 % (ref 37.5–51.0)
Hemoglobin: 14.7 g/dL (ref 13.0–17.7)
Immature Grans (Abs): 0 10*3/uL (ref 0.0–0.1)
Immature Granulocytes: 0 %
Lymphocytes Absolute: 2.1 10*3/uL (ref 0.7–3.1)
Lymphs: 25 %
MCH: 29.6 pg (ref 26.6–33.0)
MCHC: 33.6 g/dL (ref 31.5–35.7)
MCV: 88 fL (ref 79–97)
Monocytes Absolute: 0.6 10*3/uL (ref 0.1–0.9)
Monocytes: 7 %
Neutrophils Absolute: 5.8 10*3/uL (ref 1.4–7.0)
Neutrophils: 66 %
Platelets: 290 10*3/uL (ref 150–450)
RBC: 4.97 x10E6/uL (ref 4.14–5.80)
RDW: 13.2 % (ref 11.6–15.4)
WBC: 8.6 10*3/uL (ref 3.4–10.8)

## 2023-12-12 LAB — LIPID PANEL W/O CHOL/HDL RATIO
Cholesterol, Total: 125 mg/dL (ref 100–199)
HDL: 53 mg/dL (ref >39)
LDL Chol Calc (NIH): 55 mg/dL (ref 0–99)
Triglycerides: 91 mg/dL (ref 0–149)
VLDL Cholesterol Cal: 17 mg/dL (ref 5–40)

## 2023-12-12 LAB — TSH: TSH: 0.692 u[IU]/mL (ref 0.450–4.500)

## 2023-12-12 LAB — COMPREHENSIVE METABOLIC PANEL WITH GFR
ALT: <5 IU/L (ref 0–44)
AST: 22 IU/L (ref 0–40)
Albumin: 4.3 g/dL (ref 4.1–5.1)
Alkaline Phosphatase: 85 IU/L (ref 44–121)
BUN/Creatinine Ratio: 26 — ABNORMAL HIGH (ref 9–20)
BUN: 21 mg/dL (ref 6–24)
Bilirubin Total: 0.2 mg/dL (ref 0.0–1.2)
CO2: 22 mmol/L (ref 20–29)
Calcium: 9.3 mg/dL (ref 8.7–10.2)
Chloride: 98 mmol/L (ref 96–106)
Creatinine, Ser: 0.8 mg/dL (ref 0.76–1.27)
Globulin, Total: 2 g/dL (ref 1.5–4.5)
Glucose: 305 mg/dL — ABNORMAL HIGH (ref 70–99)
Potassium: 4.6 mmol/L (ref 3.5–5.2)
Sodium: 134 mmol/L (ref 134–144)
Total Protein: 6.3 g/dL (ref 6.0–8.5)
eGFR: 115 mL/min/{1.73_m2} (ref >59)

## 2023-12-12 LAB — HIV ANTIBODY (ROUTINE TESTING W REFLEX): HIV Screen 4th Generation wRfx: NONREACTIVE

## 2023-12-12 LAB — TREPONEMAL ANTIBODIES, TPPA: Treponemal Antibodies, TPPA: NONREACTIVE

## 2023-12-12 LAB — HSV 1 AND 2 AB, IGG
HSV 1 Glycoprotein G Ab, IgG: REACTIVE — AB
HSV 2 IgG, Type Spec: NONREACTIVE

## 2023-12-12 LAB — ACUTE VIRAL HEPATITIS (HAV, HBV, HCV)
HCV Ab: NONREACTIVE
Hep A IgM: NEGATIVE
Hep B C IgM: NEGATIVE
Hepatitis B Surface Ag: NEGATIVE

## 2023-12-12 LAB — RPR W/REFLEX TO TREPSURE: RPR: NONREACTIVE

## 2023-12-12 LAB — HCV INTERPRETATION

## 2023-12-13 ENCOUNTER — Telehealth: Payer: Self-pay | Admitting: *Deleted

## 2023-12-13 ENCOUNTER — Ambulatory Visit: Payer: 59 | Admitting: Family Medicine

## 2023-12-13 ENCOUNTER — Encounter: Payer: Self-pay | Admitting: Family Medicine

## 2023-12-13 LAB — GC/CHLAMYDIA PROBE AMP
Chlamydia trachomatis, NAA: NEGATIVE
Neisseria Gonorrhoeae by PCR: NEGATIVE

## 2023-12-13 NOTE — Progress Notes (Unsigned)
 Care Guide Pharmacy Note  12/13/2023 Name: Alexis [name] MRN: [id number] DOB: April 14, 1986  Referred By: Solomon Dupre, DO Reason for referral: Complex Care Management and Call Attempt #1 (Outreach to schedule referral with pharmacist )   Alexis [name] is a 38 y.o. year old male who is a primary care patient of Solomon Dupre, DO.  Alexis [name] was referred to the pharmacist for assistance related to: DMII  An unsuccessful telephone outreach was attempted today to contact the patient who was referred to the pharmacy team for assistance with medication management. Additional attempts will be made to contact the patient.  Kandis Ormond, CMA [place]  [facility], [facility] Guide Direct Dial: [phone]  Fax: [phone] Website: [email or web address]

## 2023-12-16 NOTE — Progress Notes (Signed)
 Care Guide Pharmacy Note  12/16/2023 Name: [name] MRN: [id number] DOB: 07-23-1986  Referred By: Solomon Dupre, DO Reason for referral: Complex Care Management and Call Attempt #1 (Outreach to schedule referral with pharmacist )   Alexis [name] is a 38 y.o. year old male who is a primary care patient of Solomon Dupre, DO.  Alexis [name] was referred to the pharmacist for assistance related to: DMII  Successful contact was made with the patient to discuss pharmacy services including being ready for the pharmacist to call at least 5 minutes before the scheduled appointment time and to have medication bottles and any blood pressure readings ready for review. The patient agreed to meet with the pharmacist via telephone visit on 12/30/2023  Kandis Ormond, CMA [place]  [facility], [facility] Guide Direct Dial: [phone]  Fax: [phone] Website: [email or web address]

## 2023-12-20 ENCOUNTER — Encounter: Payer: Self-pay | Admitting: Physician Assistant

## 2023-12-20 ENCOUNTER — Ambulatory Visit (INDEPENDENT_AMBULATORY_CARE_PROVIDER_SITE_OTHER): Admitting: Physician Assistant

## 2023-12-20 VITALS — BP 126/68 | HR 86 | Ht 67.0 in | Wt 173.0 lb

## 2023-12-20 DIAGNOSIS — N486 Induration penis plastica: Secondary | ICD-10-CM

## 2023-12-20 DIAGNOSIS — N500 Atrophy of testis: Secondary | ICD-10-CM

## 2023-12-20 DIAGNOSIS — N529 Male erectile dysfunction, unspecified: Secondary | ICD-10-CM
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
## 2023-12-21 LAB — TESTOSTERONE: Testosterone: 396 ng/dL (ref 264–916)

## 2023-12-25 LAB — ZNT8 ANTIBODIES

## 2023-12-25 LAB — IA-2 AUTOANTIBODIES

## 2023-12-25 LAB — C-PEPTIDE: C-Peptide: 1.8 ng/mL (ref 1.1–4.4)

## 2023-12-25 LAB — GLUTAMIC ACID DECARBOXYLASE AUTO ABS: Glutamic Acid Decarb Ab: 2078.9 U/mL — ABNORMAL HIGH (ref 0.0–5.0)

## 2023-12-30 ENCOUNTER — Other Ambulatory Visit: Payer: Self-pay

## 2023-12-30 ENCOUNTER — Ambulatory Visit: Payer: Self-pay | Admitting: Family Medicine

## 2023-12-30 DIAGNOSIS — E1065 Type 1 diabetes mellitus with hyperglycemia: Secondary | ICD-10-CM

## 2023-12-30 NOTE — Progress Notes (Signed)
 12/30/2023 Name: Alexis [name] MRN: [id number] DOB: 04-02-1986  Chief Complaint  Patient presents with   Diabetes Management Plan   Alexis [name] is a 38 y.o. year old male who presented for a telephone visit.   They were referred to the pharmacist by their PCP for assistance in managing diabetes.   Subjective:  Care Team: Primary Care Provider: Solomon Dupre, DO ; Next Scheduled Visit: 6/10  Medication Access/Adherence  Current Pharmacy:  [facility] - [place], [place] - [address] [place] [address] Phone: [phone] Fax: [phone]  [id number] [facility], [place] - [address] AT [facility] OF [address] [facility] [place] [address] Phone: [phone] Fax: [phone]  [facility] #[id number] [place], [place] - [address] AT [facility] OF [address] [place] [address] Phone: [phone] Fax: [phone]  [facility] [address] [place], [place] - [address] [place] [address] Phone: [phone] Fax: [phone]  [id number] - [place], [place] - [address] AT [facility] OF [facility] [address] [place] [address] Phone: [phone] Fax: [phone]  [facility] - [place], [place] - [address] [place] [address] Phone: [phone] Fax: [phone]  [facility] - [place], [place] - [address] [facility] AT Previously: [address], [place] [address] [facility] [address] 2 4th Floor [address] [place] [address] Phone: [phone] Fax: [phone]  -Patient reports affordability concerns with their medications: Yes  -Patient reports access/transportation concerns to their pharmacy: No  -Patient reports adherence concerns with their  medications:  No    Diabetes: Current medications: metformin  1000mg  BID, Lantus  20 units daily -Medications tried in the past: Trulicity , Jardiance  -Patient recently identified as having T1DM versus T2DM based on antibody testing -Dr. Lincoln Renshaw would like patient to begin using Libre 3 for CGM, but patient states he has not received new insurance card to be able to provide to [facility] -A1c elevated at 9.9%  Objective:  Lab Results  Component Value Date   HGBA1C 9.9 (H) 12/11/2023   Lab Results  Component Value Date   CREATININE 0.80 12/11/2023   BUN 21 12/11/2023   NA 134 12/11/2023   K 4.6 12/11/2023   CL 98 12/11/2023   CO2 22 12/11/2023   Medications Reviewed Today     Reviewed by [name], RPH (Pharmacist) on 12/30/23 at 1407  Med List Status: <None>   Medication Order Taking? Sig Documenting Provider Last Dose Status Informant  Continuous Glucose Receiver (FREESTYLE LIBRE 3 READER) DEVI [id number] No 1 each by Does not apply route daily at 2 PM.  Patient not taking: Reported on 12/30/2023   Solomon Dupre, DO Not Taking Active   Continuous Glucose Sensor (FREESTYLE LIBRE 3 SENSOR) MISC [id number] No 1 each by Does not apply route every 14 (fourteen) days.  Patient not taking: Reported on 12/30/2023   Solomon Dupre, DO Not Taking Active   insulin  glargine (LANTUS  SOLOSTAR) 100 UNIT/ML Solostar Pen [id number] Yes Inject 20 units under the skin daily. Johnson, Megan P, DO Taking Active   Insulin  Pen Needle (  PEN NEEDLES) 30G X 5 MM MISC [id number] Yes 1 each by Does not apply route 2 (two) times daily. Terre Ferri P, DO Taking Active   metFORMIN  (GLUCOPHAGE ) 500 MG tablet [id number] Yes Take 2 tablets (1,000 mg total) by mouth 2 (two) times daily with a meal. Lincoln Renshaw, Megan P, DO Taking Active   pantoprazole  (PROTONIX ) 40 MG tablet [id number]  Take 1 tablet (40 mg total) by mouth daily. Johnson, Megan P, DO  Active   sildenafil  (VIAGRA ) 100 MG tablet [id number]   Take 1 tablet (100 mg total) by mouth as needed for erectile dysfunction. Johnson, Megan P, DO  Active            Assessment/Plan:   Diabetes: -Currently uncontrolled -Recommend stopping metformin  based on T1DM diagnosis -Contacted [facility] to provide insurance information pulled in through [employer] 3 is requiring a PA, so I am routing to PA Team to assist -Likely that patient will need addition of meal-time insulin  once able to obtain CGM for more information around BG readings  Follow Up Plan: Will notify patient via MyChart message once known if Jerrilyn Moras will go through, about metformin  use, and to schedule f/u visit in another 2 weeks or so  [name], PharmD, DPLA

## 2023-12-31 ENCOUNTER — Telehealth: Payer: Self-pay

## 2023-12-31 DIAGNOSIS — E1065 Type 1 diabetes mellitus with hyperglycemia: Secondary | ICD-10-CM

## 2023-12-31 DIAGNOSIS — K219 Gastro-esophageal reflux disease without esophagitis: Secondary | ICD-10-CM

## 2023-12-31 DIAGNOSIS — N529 Male erectile dysfunction, unspecified: Secondary | ICD-10-CM

## 2023-12-31 MED ORDER — PANTOPRAZOLE SODIUM 40 MG PO TBEC: 40.0000 mg | DELAYED_RELEASE_TABLET | Freq: Every day | ORAL | 0 refills | Status: DC

## 2023-12-31 MED ORDER — SILDENAFIL CITRATE 100 MG PO TABS: 100.0000 mg | ORAL_TABLET | ORAL | 0 refills | Status: AC | PRN

## 2023-12-31 MED ORDER — FREESTYLE LIBRE 3 SENSOR MISC: 1.0000 | 4 refills | Status: DC

## 2023-12-31 MED ORDER — PEN NEEDLES 30G X 5 MM MISC: 6 refills | Status: DC

## 2023-12-31 MED ORDER — LANTUS SOLOSTAR 100 UNIT/ML ~~LOC~~ SOPN: PEN_INJECTOR | SUBCUTANEOUS | 0 refills | Status: DC

## 2023-12-31 NOTE — Progress Notes (Signed)
   12/31/2023  Patient ID: Mai Schwalbe III, male   DOB: 07-29-1986, 38 y.o.   MRN: [id number]  Subjective/Objective  Diabetes: Current medications: Lantus  20 units daily -Medications tried in the past: Trulicity , Jardiance  -Patient recently identified as having T1DM versus T2DM based on antibody testing -Informed patient to stop metformin  based on T1DM diagnosis -Insurance approved PA for [place], and sensors are going through for $40/month at [employer]- patient would like this and all medication refills sent to [employer] -Patient endorses affordability of Libre sensors, and he will use app on his iPhone versus reader -Recent A1c elevated at 9.9%  Assessment/Plan  Diabetes: -Currently uncontrolled -Contacting [facility] to have them reverse prescription for [employer] -Pending prescriptions for recently prescribed medications to go to [employer] -Sent patient email invite to link Libre Data to [employer] -Discussed that we will likely need to add meal-time insulin    Follow Up Plan: 2 weeks  [name], PharmD, DPLA

## 2023-12-31 NOTE — Telephone Encounter (Signed)
 Pharmacy Patient Advocate Encounter   Received notification from Onbase that prior authorization for FreeStyle Libre 3 Sensor is required/requested.   Insurance verification completed.   The patient is insured through [facility] .   Per test claim: PA required; PA submitted to above mentioned insurance via CoverMyMeds Key/confirmation #/EOC [id number] Status is pending

## 2024-01-02 ENCOUNTER — Other Ambulatory Visit: Payer: Self-pay

## 2024-01-02 DIAGNOSIS — E1065 Type 1 diabetes mellitus with hyperglycemia: Secondary | ICD-10-CM

## 2024-01-03 MED ORDER — PEN NEEDLES 30G X 5 MM MISC: 6 refills | Status: AC

## 2024-01-03 MED ORDER — NOVOLOG FLEXPEN 100 UNIT/ML ~~LOC~~ SOPN: 8.0000 [IU] | PEN_INJECTOR | Freq: Three times a day (TID) | SUBCUTANEOUS | 3 refills | Status: DC

## 2024-01-03 MED ORDER — LANTUS SOLOSTAR 100 UNIT/ML ~~LOC~~ SOPN: 26.0000 [IU] | PEN_INJECTOR | Freq: Every day | SUBCUTANEOUS | 3 refills | Status: DC

## 2024-01-03 NOTE — Progress Notes (Signed)
   01/03/2024  Patient ID: Alexis [name], male   DOB: 1986-04-22, 38 y.o.   MRN: [id number]  Subjective/Objective Message received from patient/wife with concern around elevated BG readings  Diabetes Management Plan -Current medications:  Lantus  22 units at bedtime -Metformin  was recently stopped after confirmation of T1DM versus T2DM -Patient increase Lantus  to 22 units when metformin  was stopped -Using [employer] for CGM -FBG is averaging 250-260 -Patient does not eat breakfast -States BG will sometimes lower to 90-100 before lunch or supper, then goes back up to >240 after eating  Assessment/Plan  Diabetes Management Plan -I recommend increasing Lantus  by approximately 10%, to 26 units at bedtime -Recommend initiation of meal time insulin  with Novolog 8 units TID immediately before meals -Recommended that patient try to eat even a small breakfast composted of lean protein and carbohydrate- do not use Novolog if skipping a meal  Follow-up:  2 weeks  [name], PharmD, DPLA

## 2024-01-08 ENCOUNTER — Telehealth: Payer: Self-pay

## 2024-01-08 ENCOUNTER — Other Ambulatory Visit (HOSPITAL_COMMUNITY): Payer: Self-pay

## 2024-01-08 NOTE — Telephone Encounter (Signed)
 Pharmacy Patient Advocate Encounter  Received notification from [employer] that Prior Authorization for FreeStyle Libre 3 Sensor has been APPROVED from 12/31/2023 to 12/30/2024   PA #/Case ID/Reference #: [id number]

## 2024-01-08 NOTE — Progress Notes (Signed)
   01/08/2024  Patient ID: Alexis [name], male   DOB: 1986/02/28, 38 y.o.   MRN: [id number]  Vernadine Golas message from patient stating Novolog is not covered by insurance.  Contacted [name], and [name] prefers Humalog Kwikpen.  Pharmacy to dispense Humalog in place of Novolog based on insurance coverage, and patient's medication list has been updated.  [name], PharmD, DPLA

## 2024-01-14 ENCOUNTER — Other Ambulatory Visit (HOSPITAL_COMMUNITY): Payer: Self-pay

## 2024-01-16 ENCOUNTER — Other Ambulatory Visit: Payer: Self-pay

## 2024-01-16 ENCOUNTER — Other Ambulatory Visit

## 2024-01-16 NOTE — Progress Notes (Signed)
   01/16/2024  Patient ID: Alexis [name], male   DOB: 1986/07/05, 38 y.o.   MRN: [id number]  Subjective/Objective Telephone visit to follow-up on management of T1DM   Diabetes Management Plan -Current medications:  Lantus  22 units at bedtime -Humalog 8 units TID before meals recently prescribed, but patient has not started taking yet due to timing concerns with long-acting insulin .  Patient states he usually eats supper around 7/730pm and goes to bed around 10/1030pm and was not sure if it was okay to take meal time insulin  before supper and then take Lantus  only a few hours later. -FBG remains >200, and post-prandial values can go above 300 -Does not endorse any s/sx of hypoglycemia -Using [place] 3 for CGM and states a sensor fell off early, so he may need a refill before insurance will cover again -Inquiring about storage of insulin  during the summer since he will need to keep it with him and is not always in a controlled environment   Assessment/Plan   Diabetes Management Plan -I recommend increasing Lantus  by approximately 10%, to 26 units at bedtime -Recommend initiation of meal time insulin  with Novolog  8 units TID immediately before meals -Discussed onset and duration of both rapid-acting and long-acting insulin  and that having Humalog before supper and Lantus  a few hours later prior to bed is appropriate -Reminded patient to always eat right after Humalog injection and not to use if skipping a meal, since he does not always eat breakfast -Providing [employer] link where patient can request replacement Libre sensors via My Chart message -Education patient that insulin  needs to be stored in refrigerated or room temperature environment; suggested storing in a cooler if it will be left in his vehicle.  I am also sending a link to some insulin  storage packs that keep insulin  cool and record temperature   Follow-up:  2 weeks   [name], PharmD, DPLA

## 2024-01-17 ENCOUNTER — Encounter: Payer: Self-pay | Admitting: Physician Assistant

## 2024-01-17 ENCOUNTER — Ambulatory Visit (INDEPENDENT_AMBULATORY_CARE_PROVIDER_SITE_OTHER): Admitting: Physician Assistant

## 2024-01-17 VITALS — BP 118/76 | HR 68 | Ht 66.0 in | Wt 171.6 lb

## 2024-01-17 DIAGNOSIS — N529 Male erectile dysfunction, unspecified: Secondary | ICD-10-CM

## 2024-01-17 MED ORDER — AMBULATORY NON FORMULARY MEDICATION: 0 refills | Status: DC
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
## 2024-01-17 NOTE — Patient Instructions (Addendum)
 You have been scheduled for a Trimix titration appointment.    -In order for the appointment to proceed, there are certain parameters that need to be met. -Your blood pressure must be under control, this means it should be approximately 140/90 or lower.  If you take blood pressure medications, do not skip taking them.  If you have uncontrolled BP, I would get that under control before you pick up your medication and before your appointment.  We will take your blood pressure at the appointment and if it is above what is acceptable we will need to have you rescheduled.  This could result in your medication expiring before you are able to get back in for an appointment.  -Your medicine will be sent to [facility] in [place] [phone]), they will contact you once they receive the prescription to arrange payment plans and how you will get the medication (pick up or delivery) - if you do not hear from them in 2 to 3 days, please call them  -Your medication will have an expiration date, so do not pick up the medication more than a week before your appointment -When you pick up the medication, you will have 3 vials of medication, a collection of needles and a collection of needles attached to a syringe, bring everything to your appointment.  We do not have any of this medication in the office and if you do not bring the medication, needles or syringes, the appointment cannot go forward. -Plan on being at your appointment for approximately 2 hours.  During the appointment, you will have small amounts of the medication injected directly into your penis to see what dose is needed to achieve a satisfactory erection.  Once a satisfactory erection is achieved, we will inject a medication to bring down the erection before you leave the office

## 2024-01-21 ENCOUNTER — Telehealth (INDEPENDENT_AMBULATORY_CARE_PROVIDER_SITE_OTHER): Admitting: Family Medicine

## 2024-01-21 ENCOUNTER — Encounter: Payer: Self-pay | Admitting: Family Medicine

## 2024-01-21 DIAGNOSIS — E1065 Type 1 diabetes mellitus with hyperglycemia: Secondary | ICD-10-CM | POA: Diagnosis not present

## 2024-01-21 MED ORDER — LANTUS SOLOSTAR 100 UNIT/ML ~~LOC~~ SOPN: 28.0000 [IU] | PEN_INJECTOR | Freq: Every day | SUBCUTANEOUS | Status: DC

## 2024-01-21 NOTE — Assessment & Plan Note (Signed)
 Sugars improving. Will increase his lantus  to 28 units and continue 8 units of humalog. Encouraged regular meals. Due to follow up with pharmacy in 3 weeks. Will follow up here for A1c in 6 weeks. Call with any concerns.

## 2024-01-21 NOTE — Progress Notes (Signed)
 Ht 5\' 6"  (1.676 m)   Wt 171 lb (77.6 kg)   BMI 27.60 kg/m    Subjective:    Patient ID: Alexis [name], male    DOB: 1986-05-15, 38 y.o.   MRN: [id number]  HPI: FARDEEN STEINBERGER [name] is a 38 y.o. male  Chief Complaint  Patient presents with   Diabetes    BGL running 250/300s. Just started the short acting insulin  this past Friday.     DIABETES- started the short acting on Friday Hypoglycemic episodes:no Polydipsia/polyuria: no Visual disturbance: no Chest pain: no Paresthesias: no Glucose Monitoring: yes  Accucheck frequency: continuous  Fasting glucose: 250-300  Pre-prandial: 150-170  Before lunch: 90, 115 Taking Insulin ?: yes  Long acting insulin : 26 units at bedtime  Short acting insulin : 8 units before you eat Blood Pressure Monitoring: not checking Retinal Examination: Not up to Date Foot Exam: Up to Date Diabetic Education: Completed Pneumovax: Up to Date Influenza: Not up to Date Aspirin: no  Relevant past medical, surgical, family and social history reviewed and updated as indicated. Interim medical history since our last visit reviewed. Allergies and medications reviewed and updated.  Review of Systems  Constitutional: Negative.   Respiratory: Negative.    Cardiovascular: Negative.   Musculoskeletal: Negative.   Neurological: Negative.   Psychiatric/Behavioral: Negative.      Per HPI unless specifically indicated above     Objective:     Ht 5\' 6"  (1.676 m)   Wt 171 lb (77.6 kg)   BMI 27.60 kg/m   Wt Readings from Last 3 Encounters:  01/21/24 171 lb (77.6 kg)  01/17/24 171 lb 9.6 oz (77.8 kg)  12/20/23 173 lb (78.5 kg)    Physical Exam Constitutional:      General: He is not in acute distress.    Appearance: Normal appearance. He is well-developed. He is obese. He is not ill-appearing, toxic-appearing or diaphoretic.  HENT:     Head: Normocephalic and atraumatic.     Right Ear: Hearing and external ear normal.     Left Ear:  Hearing and external ear normal.     Nose: Nose normal.  Eyes:     General: Lids are normal. No scleral icterus.       Right eye: No discharge.        Left eye: No discharge.     Extraocular Movements: Extraocular movements intact.     Conjunctiva/sclera: Conjunctivae normal.     Pupils: Pupils are equal, round, and reactive to light.  Pulmonary:     Effort: Pulmonary effort is normal. No respiratory distress.  Musculoskeletal:        General: Normal range of motion.     Cervical back: Normal range of motion.  Skin:    Coloration: Skin is not jaundiced or pale.     Findings: No bruising, erythema, lesion or rash.  Neurological:     General: No focal deficit present.     Mental Status: He is alert and oriented to person, place, and time.  Psychiatric:        Mood and Affect: Mood normal.        Speech: Speech normal.        Behavior: Behavior normal.        Thought Content: Thought content normal.        Judgment: Judgment normal.     Results for orders placed or performed in visit on 12/20/23  Testosterone    Collection Time: 12/20/23  9:19 AM  Result Value Ref Range   Testosterone  396 264 - 916 ng/dL      Assessment & Plan:   Problem List Items Addressed This Visit       Endocrine   Uncontrolled type 1 diabetes mellitus with hyperglycemia, with long-term current use of insulin  (HCC)   Sugars improving. Will increase his lantus  to 28 units and continue 8 units of humalog. Encouraged regular meals. Due to follow up with pharmacy in 3 weeks. Will follow up here for A1c in 6 weeks. Call with any concerns.       Relevant Medications   insulin  glargine (LANTUS  SOLOSTAR) 100 UNIT/ML Solostar Pen     Follow up plan: Return in about 6 weeks (around 03/03/2024).    This visit was completed via video visit through MyChart due to the restrictions of the COVID-19 pandemic. All issues as above were discussed and addressed. Physical exam was done as above through visual  confirmation on video through MyChart. If it was felt that the patient should be evaluated in the office, they were directed there. The patient verbally consented to this visit. Location of the patient: parking lot Location of the provider: work Those involved with this call:  Provider: Terre Ferri, DO CMA: Linda Repress, CMA, Front Desk/Registration: Jaynee Meyer  Time spent on call: 15 minutes with patient face to face via video conference. More than 50% of this time was spent in counseling and coordination of care. 23 minutes total spent in review of patient's record and preparation of their chart.

## 2024-01-22 ENCOUNTER — Encounter: Payer: Self-pay | Admitting: Family Medicine

## 2024-01-22 ENCOUNTER — Other Ambulatory Visit: Payer: Self-pay

## 2024-01-22 DIAGNOSIS — E1065 Type 1 diabetes mellitus with hyperglycemia: Secondary | ICD-10-CM

## 2024-01-22 MED ORDER — LANTUS SOLOSTAR 100 UNIT/ML ~~LOC~~ SOPN
28.0000 [IU] | PEN_INJECTOR | Freq: Every day | SUBCUTANEOUS | 0 refills | Status: DC
Start: 2024-01-22 — End: 2024-03-12

## 2024-01-22 MED ORDER — LANTUS SOLOSTAR 100 UNIT/ML ~~LOC~~ SOPN: 28.0000 [IU] | PEN_INJECTOR | Freq: Every day | SUBCUTANEOUS | Status: DC

## 2024-01-22 NOTE — Telephone Encounter (Signed)
 Copied from CRM [id number]. Topic: Clinical - Medication Question >> Jan 17, 2024 10:31 AM Lizabeth Riggs wrote: Reason for CRM:  [facility] needs to have a call back about the daily dosage of the following medication: insulin  glargine (LANTUS  SOLOSTAR) 100 UNIT/ML Solostar Pen  He needs to know how Alexis [name] need to take medication daily. The order is confusing.  Please call back [phone] Thanks

## 2024-01-22 NOTE — Telephone Encounter (Signed)
 Sending to PCP to correct unit dosing discrepancy.

## 2024-01-22 NOTE — Progress Notes (Signed)
 Called patient, left message for patient to call and scehdule appt.

## 2024-01-24 NOTE — Progress Notes (Signed)
 Called left message for patient to call the office and schedule an appointment

## 2024-02-11 ENCOUNTER — Ambulatory Visit: Admitting: Urology

## 2024-02-11 ENCOUNTER — Other Ambulatory Visit: Payer: Self-pay

## 2024-02-11 NOTE — Progress Notes (Signed)
   02/11/2024  Patient ID: Alexis [name], male   DOB: 17-Dec-1985, 38 y.o.   MRN: [id number]  Subjective/Objective Telephone visit to follow-up on management of T1DM   Diabetes Management Plan -Current medications:  Lantus  30 units at bedtime, Novolog  8 units TID with meals -Patient is currently using Dexcom sensors given to him by coworker, because it was too soon to refill his [employer] where 1 had fallen off too early.  Reports numerous occasions where sensor varies from fingerstick by up to 60 points. -FBG remains >200 often, but BG tends to decrease throughout the day -Patient does endorse some s/sx of hypoglycemia- this will typically occur during the day -Does endorse typically only eating lunch and supper daily  -Patient inquiring about sliding scale insulin  based on carbohydrate intake and correction factor -A1c 9.9% 4/30   Assessment/Plan   Diabetes Management Plan -Test claim reflects insurance will cover refill for [facility] 3 sensors, so I advised patient to refill to see if he continues to see such drastic differences between these sensor readings and fingersticks -Continue Lantus  30 units at bedtime -Consulting sliding scale with Dr. Vicci and will follow-up with patient -Due for A1c the end of July; follow-up visit currently not scheduled   Alexis [name], PharmD, DPLA

## 2024-02-20 ENCOUNTER — Encounter: Payer: Self-pay | Admitting: Family Medicine

## 2024-02-25 MED ORDER — LANCET DEVICE MISC: 1.0000 | Freq: Three times a day (TID) | 0 refills | Status: AC

## 2024-02-25 MED ORDER — BLOOD GLUCOSE MONITORING SUPPL DEVI: 1.0000 | Freq: Three times a day (TID) | 0 refills | Status: AC

## 2024-02-25 MED ORDER — BLOOD GLUCOSE TEST VI STRP: 1.0000 | ORAL_STRIP | Freq: Three times a day (TID) | 0 refills | Status: DC

## 2024-02-25 MED ORDER — LANCETS MISC. MISC: 1.0000 | Freq: Three times a day (TID) | 0 refills | Status: AC

## 2024-03-11 ENCOUNTER — Other Ambulatory Visit: Payer: Self-pay | Admitting: Family Medicine

## 2024-03-11 DIAGNOSIS — E1065 Type 1 diabetes mellitus with hyperglycemia: Secondary | ICD-10-CM

## 2024-03-12 NOTE — Telephone Encounter (Signed)
 Requested Prescriptions  Pending Prescriptions Disp Refills   LANTUS  SOLOSTAR 100 UNIT/ML Solostar Pen [Pharmacy Med Name: Lantus  SoloStar Prefilled Pen 100unit/mL Solution for Injection] 15 mL 0    Sig: Inject 28 units under the skin at bedtime.     Endocrinology:  Diabetes - Insulins Failed - 03/12/2024  2:09 PM      Failed - HBA1C is between 0 and 7.9 and within 180 days    Hemoglobin A1C  Date Value Ref Range Status  03/08/2017 6.1  Final   HB A1C (BAYER DCA - WAIVED)  Date Value Ref Range Status  12/11/2023 9.9 (H) 4.8 - 5.6 % Final    Comment:             Prediabetes: 5.7 - 6.4          Diabetes: >6.4          Glycemic control for adults with diabetes: <7.0          Passed - Valid encounter within last 6 months    Recent Outpatient Visits           1 month ago Uncontrolled type 1 diabetes mellitus with hyperglycemia, with long-term current use of insulin  (HCC)   [place] [facility] [place], Megan P, DO   3 months ago Routine general medical examination at a health care facility   [facility], Megan P, DO

## 2024-03-17 ENCOUNTER — Other Ambulatory Visit: Payer: Self-pay | Admitting: Family Medicine

## 2024-03-19 ENCOUNTER — Telehealth: Payer: Self-pay

## 2024-03-19 NOTE — Telephone Encounter (Signed)
 Requested Prescriptions  Pending Prescriptions Disp Refills   Glucose Blood (BLOOD GLUCOSE TEST STRIPS) STRP [Pharmacy Med Name: PRODIGY NO CODING STRIPS] 100 each 1    Sig: Use 1 strip in the morning, at noon, and at bedtime.     Endocrinology: Diabetes - Testing Supplies Passed - 03/19/2024  2:10 PM      Passed - Valid encounter within last 12 months    Recent Outpatient Visits           1 month ago Uncontrolled type 1 diabetes mellitus with hyperglycemia, with long-term current use of insulin  [facility])   [place] [facility] [place], Megan P, DO   3 months ago Routine general medical examination at a health care facility   [facility], Megan P, DO

## 2024-03-19 NOTE — Progress Notes (Signed)
   03/19/2024  Patient ID: Alexis [name], male   DOB: [birth year], 38 y.o.   MRN: [id number]  Outreach attempt to followup with patient in regard to control of diabetes.  Patient recently requested orders be sent to [facility] for testing supplies- wanting to verify these were received from pharmacy.  Patient had also recently inquired about sliding scale insulin , which PCP plans to discuss at next visit; but no follow-up with Dr. Vicci is currently scheduled.  Patient is scheduled to see endocrinology in September, though.  I was not able to reach the patient but did leave a HIPAA compliant voicemail with my direct phone number.  Channing DELENA Mealing, PharmD, DPLA

## 2024-04-28 ENCOUNTER — Encounter: Payer: Self-pay | Admitting: Family Medicine

## 2024-06-10 ENCOUNTER — Other Ambulatory Visit: Payer: Self-pay | Admitting: Family Medicine

## 2024-06-10 DIAGNOSIS — E1065 Type 1 diabetes mellitus with hyperglycemia: Secondary | ICD-10-CM

## 2024-06-12 NOTE — Telephone Encounter (Signed)
 Requested Prescriptions  Pending Prescriptions Disp Refills   LANTUS  SOLOSTAR 100 UNIT/ML Solostar Pen [Pharmacy Med Name: Lantus  SoloStar Prefilled Pen 100unit/mL Solution for Injection] 15 mL 0    Sig: Inject 28 units under the skin at bedtime.     Endocrinology:  Diabetes - Insulins Failed - 06/12/2024 11:50 AM      Failed - HBA1C is between 0 and 7.9 and within 180 days    Hemoglobin A1C  Date Value Ref Range Status  03/08/2017 6.1  Final   HB A1C (BAYER DCA - WAIVED)  Date Value Ref Range Status  12/11/2023 9.9 (H) 4.8 - 5.6 % Final    Comment:             Prediabetes: 5.7 - 6.4          Diabetes: >6.4          Glycemic control for adults with diabetes: <7.0          Passed - Valid encounter within last 6 months    Recent Outpatient Visits           4 months ago Uncontrolled type 1 diabetes mellitus with hyperglycemia, with long-term current use of insulin  [facility])   [place] [facility] [place], Megan P, DO   6 months ago Routine general medical examination at a health care facility   [facility], Megan P, DO

## 2024-08-11 ENCOUNTER — Other Ambulatory Visit: Payer: Self-pay | Admitting: Family Medicine

## 2024-08-11 DIAGNOSIS — E1065 Type 1 diabetes mellitus with hyperglycemia: Secondary | ICD-10-CM

## 2024-08-12 NOTE — Telephone Encounter (Signed)
 Patient needs an appointment as he is overdue. Please call to schedule appointment and then route to provider for refill.

## 2024-08-16 ENCOUNTER — Other Ambulatory Visit: Payer: Self-pay | Admitting: Family Medicine

## 2024-08-17 NOTE — Telephone Encounter (Signed)
 Appt scheduled 08/28/2024 at 8:00 AM

## 2024-08-18 NOTE — Telephone Encounter (Signed)
 Requested Prescriptions  Refused Prescriptions Disp Refills   busPIRone  (BUSPAR ) 5 MG tablet 180 tablet 0    Sig: Take 1 to 2 tablets by mouth twice daily.     Psychiatry: Anxiolytics/Hypnotics - Non-controlled Passed - 08/18/2024 11:30 AM      Passed - Valid encounter within last 12 months    Recent Outpatient Visits           7 months ago Uncontrolled type 1 diabetes mellitus with hyperglycemia, with long-term current use of insulin  [facility])   [place] [facility] [place], Megan P, DO   8 months ago Routine general medical examination at a health care facility   [facility], Megan P, DO

## 2024-08-28 ENCOUNTER — Ambulatory Visit: Admitting: Family Medicine

## 2024-08-28 ENCOUNTER — Encounter: Payer: Self-pay | Admitting: Family Medicine

## 2024-08-28 ENCOUNTER — Telehealth: Payer: Self-pay

## 2024-08-28 VITALS — BP 114/82 | HR 77 | Temp 97.5°F | Ht 66.0 in | Wt 184.8 lb

## 2024-08-28 DIAGNOSIS — K219 Gastro-esophageal reflux disease without esophagitis: Secondary | ICD-10-CM

## 2024-08-28 DIAGNOSIS — F40243 Fear of flying: Secondary | ICD-10-CM | POA: Diagnosis not present

## 2024-08-28 DIAGNOSIS — E1065 Type 1 diabetes mellitus with hyperglycemia: Secondary | ICD-10-CM

## 2024-08-28 DIAGNOSIS — Z789 Other specified health status: Secondary | ICD-10-CM

## 2024-08-28 DIAGNOSIS — M25512 Pain in left shoulder: Secondary | ICD-10-CM | POA: Diagnosis not present

## 2024-08-28 DIAGNOSIS — Z113 Encounter for screening for infections with a predominantly sexual mode of transmission: Secondary | ICD-10-CM | POA: Diagnosis not present

## 2024-08-28 LAB — MICROALBUMIN, URINE WAIVED
Creatinine, Urine Waived: 300 mg/dL (ref 10–300)
Microalb, Ur Waived: 80 mg/L — ABNORMAL HIGH (ref 0–19)

## 2024-08-28 LAB — BAYER DCA HB A1C WAIVED: HB A1C (BAYER DCA - WAIVED): 8.9 % — ABNORMAL HIGH (ref 4.8–5.6)

## 2024-08-28 MED ORDER — LANTUS SOLOSTAR 100 UNIT/ML ~~LOC~~ SOPN: 25.0000 [IU] | PEN_INJECTOR | Freq: Every day | SUBCUTANEOUS | 0 refills | Status: AC

## 2024-08-28 MED ORDER — LORAZEPAM 0.5 MG PO TABS: ORAL_TABLET | ORAL | 0 refills | Status: AC

## 2024-08-28 MED ORDER — PANTOPRAZOLE SODIUM 40 MG PO TBEC: 40.0000 mg | DELAYED_RELEASE_TABLET | Freq: Every day | ORAL | 0 refills | Status: AC

## 2024-08-28 MED ORDER — LORAZEPAM 0.5 MG PO TABS: ORAL_TABLET | ORAL | 0 refills | Status: DC

## 2024-08-28 MED ORDER — INSULIN LISPRO (1 UNIT DIAL) 100 UNIT/ML (KWIKPEN): 8.0000 [IU] | PEN_INJECTOR | Freq: Three times a day (TID) | SUBCUTANEOUS | 0 refills | Status: AC
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
## 2024-08-28 NOTE — Telephone Encounter (Signed)
-----   Message from Duwaine Louder, DO sent at 08/28/2024  8:45 AM EST ----- Can we please cancel lorazepam  at [facility] mail order- sent incorrectly

## 2024-08-28 NOTE — Assessment & Plan Note (Signed)
 Under good control on current regimen. Continue current regimen. Continue to monitor. Call with any concerns. Refills given.

## 2024-08-28 NOTE — Telephone Encounter (Signed)
 Contacted [employer] and cancelled Lorazepam  as instructed.

## 2024-08-28 NOTE — Assessment & Plan Note (Signed)
 Not under good control with A1c of 8.9. Having some night time lows. Only eating 2x a day with carb heavy meals. Will get him into nutrition and also see about getting him into endocrinology. Continue to monitor closely. Call with any concerns.

## 2024-08-29 LAB — RPR W/REFLEX TO TREPSURE

## 2024-08-30 ENCOUNTER — Ambulatory Visit: Payer: Self-pay | Admitting: Family Medicine

## 2024-08-30 LAB — COMPREHENSIVE METABOLIC PANEL WITH GFR
ALT: <5 IU/L (ref 0–44)
AST: 22 IU/L (ref 0–40)
Albumin: 4.5 g/dL (ref 4.1–5.1)
Alkaline Phosphatase: 110 IU/L (ref 47–123)
BUN/Creatinine Ratio: 20 (ref 9–20)
BUN: 17 mg/dL (ref 6–24)
Bilirubin Total: <0.2 mg/dL (ref 0.0–1.2)
CO2: 23 mmol/L (ref 20–29)
Calcium: 9.1 mg/dL (ref 8.7–10.2)
Chloride: 102 mmol/L (ref 96–106)
Creatinine, Ser: 0.84 mg/dL (ref 0.76–1.27)
Globulin, Total: 2.2 g/dL (ref 1.5–4.5)
Glucose: 222 mg/dL — ABNORMAL HIGH (ref 70–99)
Potassium: 4.7 mmol/L (ref 3.5–5.2)
Sodium: 138 mmol/L (ref 134–144)
Total Protein: 6.7 g/dL (ref 6.0–8.5)
eGFR: 113 mL/min/1.73

## 2024-08-30 LAB — TREPONEMAL ANTIBODIES, TPPA: Treponemal Antibodies, TPPA: NONREACTIVE

## 2024-08-30 LAB — LIPID PANEL W/O CHOL/HDL RATIO
Cholesterol, Total: 150 mg/dL (ref 100–199)
HDL: 52 mg/dL
LDL Chol Calc (NIH): 76 mg/dL (ref 0–99)
Triglycerides: 123 mg/dL (ref 0–149)
VLDL Cholesterol Cal: 22 mg/dL (ref 5–40)

## 2024-08-30 LAB — CBC WITH DIFFERENTIAL/PLATELET
Basophils Absolute: 0 x10E3/uL (ref 0.0–0.2)
Basos: 1 %
EOS (ABSOLUTE): 0.1 x10E3/uL (ref 0.0–0.4)
Eos: 1 %
Hematocrit: 49.6 % (ref 37.5–51.0)
Hemoglobin: 15.8 g/dL (ref 13.0–17.7)
Immature Grans (Abs): 0 x10E3/uL (ref 0.0–0.1)
Immature Granulocytes: 0 %
Lymphocytes Absolute: 1.8 x10E3/uL (ref 0.7–3.1)
Lymphs: 24 %
MCH: 29 pg (ref 26.6–33.0)
MCHC: 31.9 g/dL (ref 31.5–35.7)
MCV: 91 fL (ref 79–97)
Monocytes Absolute: 0.5 x10E3/uL (ref 0.1–0.9)
Monocytes: 6 %
Neutrophils Absolute: 5 x10E3/uL (ref 1.4–7.0)
Neutrophils: 68 %
Platelets: 260 x10E3/uL (ref 150–450)
RBC: 5.45 x10E6/uL (ref 4.14–5.80)
RDW: 13.1 % (ref 11.6–15.4)
WBC: 7.5 x10E3/uL (ref 3.4–10.8)

## 2024-08-30 LAB — RPR W/REFLEX TO TREPSURE: RPR: NONREACTIVE

## 2024-08-30 LAB — HSV 1 AND 2 AB, IGG
HSV 1 Glycoprotein G Ab, IgG: REACTIVE — AB
HSV 2 IgG, Type Spec: NONREACTIVE

## 2024-08-30 LAB — HIV ANTIBODY (ROUTINE TESTING W REFLEX): HIV Screen 4th Generation wRfx: NONREACTIVE

## 2024-08-30 LAB — ACUTE VIRAL HEPATITIS (HAV, HBV, HCV)
HCV Ab: NONREACTIVE
Hep A IgM: NEGATIVE
Hep B C IgM: NEGATIVE
Hepatitis B Surface Ag: NEGATIVE

## 2024-08-30 LAB — HCV INTERPRETATION

## 2024-08-30 LAB — HEPATITIS B SURFACE ANTIBODY, QUANTITATIVE: Hepatitis B Surf Ab Quant: 29.6 m[IU]/mL

## 2024-08-31 LAB — GC/CHLAMYDIA PROBE AMP
Chlamydia trachomatis, NAA: NEGATIVE
Neisseria Gonorrhoeae by PCR: NEGATIVE

## 2024-09-03 ENCOUNTER — Ambulatory Visit

## 2024-10-07 ENCOUNTER — Encounter: Admitting: Dietician

## 2024-11-27 ENCOUNTER — Encounter: Admitting: Family Medicine
